# Patient Record
Sex: Male | Born: 1935 | Race: White | Hispanic: No | Smoking: Former smoker
Health system: Southern US, Community
[De-identification: ages and names within clinical notes are randomized; demographics above are authoritative.]

## PROBLEM LIST (undated history)

## (undated) DIAGNOSIS — I739 Peripheral vascular disease, unspecified: Secondary | ICD-10-CM

## (undated) DIAGNOSIS — I69354 Hemiplegia and hemiparesis following cerebral infarction affecting left non-dominant side: Secondary | ICD-10-CM

## (undated) DIAGNOSIS — K635 Polyp of colon: Secondary | ICD-10-CM

## (undated) DIAGNOSIS — E785 Hyperlipidemia, unspecified: Secondary | ICD-10-CM

## (undated) DIAGNOSIS — G20A1 Parkinson's disease without dyskinesia, without mention of fluctuations: Secondary | ICD-10-CM

## (undated) DIAGNOSIS — N2889 Other specified disorders of kidney and ureter: Secondary | ICD-10-CM

## (undated) DIAGNOSIS — I10 Essential (primary) hypertension: Secondary | ICD-10-CM

## (undated) DIAGNOSIS — Z66 Do not resuscitate: Secondary | ICD-10-CM

## (undated) DIAGNOSIS — F419 Anxiety disorder, unspecified: Secondary | ICD-10-CM

## (undated) DIAGNOSIS — C801 Malignant (primary) neoplasm, unspecified: Secondary | ICD-10-CM

## (undated) DIAGNOSIS — I480 Paroxysmal atrial fibrillation: Secondary | ICD-10-CM

## (undated) DIAGNOSIS — C4490 Unspecified malignant neoplasm of skin, unspecified: Secondary | ICD-10-CM

## (undated) DIAGNOSIS — M1990 Unspecified osteoarthritis, unspecified site: Secondary | ICD-10-CM

## (undated) DIAGNOSIS — G20 Parkinson's disease: Secondary | ICD-10-CM

## (undated) DIAGNOSIS — F329 Major depressive disorder, single episode, unspecified: Secondary | ICD-10-CM

## (undated) DIAGNOSIS — I509 Heart failure, unspecified: Secondary | ICD-10-CM

## (undated) DIAGNOSIS — Z45018 Encounter for adjustment and management of other part of cardiac pacemaker: Secondary | ICD-10-CM

## (undated) DIAGNOSIS — F32A Depression, unspecified: Secondary | ICD-10-CM

## (undated) HISTORY — PX: OTHER SURGICAL HISTORY: SHX169

---

## 2014-03-04 DIAGNOSIS — B351 Tinea unguium: Secondary | ICD-10-CM | POA: Diagnosis not present

## 2014-03-04 DIAGNOSIS — M79609 Pain in unspecified limb: Secondary | ICD-10-CM | POA: Diagnosis not present

## 2014-03-22 DIAGNOSIS — R109 Unspecified abdominal pain: Secondary | ICD-10-CM | POA: Diagnosis not present

## 2014-03-22 DIAGNOSIS — R51 Headache: Secondary | ICD-10-CM | POA: Diagnosis not present

## 2014-04-23 DIAGNOSIS — I10 Essential (primary) hypertension: Secondary | ICD-10-CM | POA: Diagnosis not present

## 2014-04-23 DIAGNOSIS — IMO0001 Reserved for inherently not codable concepts without codable children: Secondary | ICD-10-CM | POA: Diagnosis not present

## 2014-04-23 DIAGNOSIS — R279 Unspecified lack of coordination: Secondary | ICD-10-CM | POA: Diagnosis not present

## 2014-04-23 DIAGNOSIS — I4891 Unspecified atrial fibrillation: Secondary | ICD-10-CM | POA: Diagnosis not present

## 2014-04-23 DIAGNOSIS — G20 Parkinson's disease: Secondary | ICD-10-CM | POA: Diagnosis not present

## 2014-04-27 DIAGNOSIS — R279 Unspecified lack of coordination: Secondary | ICD-10-CM | POA: Diagnosis not present

## 2014-04-27 DIAGNOSIS — G20 Parkinson's disease: Secondary | ICD-10-CM | POA: Diagnosis not present

## 2014-04-27 DIAGNOSIS — I10 Essential (primary) hypertension: Secondary | ICD-10-CM | POA: Diagnosis not present

## 2014-04-27 DIAGNOSIS — IMO0001 Reserved for inherently not codable concepts without codable children: Secondary | ICD-10-CM | POA: Diagnosis not present

## 2014-04-27 DIAGNOSIS — I4891 Unspecified atrial fibrillation: Secondary | ICD-10-CM | POA: Diagnosis not present

## 2014-04-29 DIAGNOSIS — G20 Parkinson's disease: Secondary | ICD-10-CM | POA: Diagnosis not present

## 2014-04-29 DIAGNOSIS — IMO0001 Reserved for inherently not codable concepts without codable children: Secondary | ICD-10-CM | POA: Diagnosis not present

## 2014-04-29 DIAGNOSIS — I10 Essential (primary) hypertension: Secondary | ICD-10-CM | POA: Diagnosis not present

## 2014-04-29 DIAGNOSIS — I4891 Unspecified atrial fibrillation: Secondary | ICD-10-CM | POA: Diagnosis not present

## 2014-04-29 DIAGNOSIS — R279 Unspecified lack of coordination: Secondary | ICD-10-CM | POA: Diagnosis not present

## 2014-05-06 DIAGNOSIS — R279 Unspecified lack of coordination: Secondary | ICD-10-CM | POA: Diagnosis not present

## 2014-05-06 DIAGNOSIS — I4891 Unspecified atrial fibrillation: Secondary | ICD-10-CM | POA: Diagnosis not present

## 2014-05-06 DIAGNOSIS — G20 Parkinson's disease: Secondary | ICD-10-CM | POA: Diagnosis not present

## 2014-05-06 DIAGNOSIS — IMO0001 Reserved for inherently not codable concepts without codable children: Secondary | ICD-10-CM | POA: Diagnosis not present

## 2014-05-06 DIAGNOSIS — I10 Essential (primary) hypertension: Secondary | ICD-10-CM | POA: Diagnosis not present

## 2014-05-07 DIAGNOSIS — R279 Unspecified lack of coordination: Secondary | ICD-10-CM | POA: Diagnosis not present

## 2014-05-07 DIAGNOSIS — I10 Essential (primary) hypertension: Secondary | ICD-10-CM | POA: Diagnosis not present

## 2014-05-07 DIAGNOSIS — G20 Parkinson's disease: Secondary | ICD-10-CM | POA: Diagnosis not present

## 2014-05-07 DIAGNOSIS — IMO0001 Reserved for inherently not codable concepts without codable children: Secondary | ICD-10-CM | POA: Diagnosis not present

## 2014-05-07 DIAGNOSIS — I4891 Unspecified atrial fibrillation: Secondary | ICD-10-CM | POA: Diagnosis not present

## 2014-05-11 DIAGNOSIS — IMO0001 Reserved for inherently not codable concepts without codable children: Secondary | ICD-10-CM | POA: Diagnosis not present

## 2014-05-11 DIAGNOSIS — R279 Unspecified lack of coordination: Secondary | ICD-10-CM | POA: Diagnosis not present

## 2014-05-11 DIAGNOSIS — I4891 Unspecified atrial fibrillation: Secondary | ICD-10-CM | POA: Diagnosis not present

## 2014-05-11 DIAGNOSIS — I10 Essential (primary) hypertension: Secondary | ICD-10-CM | POA: Diagnosis not present

## 2014-05-11 DIAGNOSIS — G20 Parkinson's disease: Secondary | ICD-10-CM | POA: Diagnosis not present

## 2014-05-13 DIAGNOSIS — I10 Essential (primary) hypertension: Secondary | ICD-10-CM | POA: Diagnosis not present

## 2014-05-13 DIAGNOSIS — IMO0001 Reserved for inherently not codable concepts without codable children: Secondary | ICD-10-CM | POA: Diagnosis not present

## 2014-05-13 DIAGNOSIS — I4891 Unspecified atrial fibrillation: Secondary | ICD-10-CM | POA: Diagnosis not present

## 2014-05-13 DIAGNOSIS — R279 Unspecified lack of coordination: Secondary | ICD-10-CM | POA: Diagnosis not present

## 2014-05-13 DIAGNOSIS — G20 Parkinson's disease: Secondary | ICD-10-CM | POA: Diagnosis not present

## 2014-05-14 DIAGNOSIS — R279 Unspecified lack of coordination: Secondary | ICD-10-CM | POA: Diagnosis not present

## 2014-05-14 DIAGNOSIS — I4891 Unspecified atrial fibrillation: Secondary | ICD-10-CM | POA: Diagnosis not present

## 2014-05-14 DIAGNOSIS — IMO0001 Reserved for inherently not codable concepts without codable children: Secondary | ICD-10-CM | POA: Diagnosis not present

## 2014-05-14 DIAGNOSIS — G20 Parkinson's disease: Secondary | ICD-10-CM | POA: Diagnosis not present

## 2014-05-14 DIAGNOSIS — I10 Essential (primary) hypertension: Secondary | ICD-10-CM | POA: Diagnosis not present

## 2014-05-19 DIAGNOSIS — G20 Parkinson's disease: Secondary | ICD-10-CM | POA: Diagnosis not present

## 2014-05-19 DIAGNOSIS — R279 Unspecified lack of coordination: Secondary | ICD-10-CM | POA: Diagnosis not present

## 2014-05-19 DIAGNOSIS — I4891 Unspecified atrial fibrillation: Secondary | ICD-10-CM | POA: Diagnosis not present

## 2014-05-19 DIAGNOSIS — IMO0001 Reserved for inherently not codable concepts without codable children: Secondary | ICD-10-CM | POA: Diagnosis not present

## 2014-05-19 DIAGNOSIS — I10 Essential (primary) hypertension: Secondary | ICD-10-CM | POA: Diagnosis not present

## 2014-05-20 DIAGNOSIS — I10 Essential (primary) hypertension: Secondary | ICD-10-CM | POA: Diagnosis not present

## 2014-05-20 DIAGNOSIS — R279 Unspecified lack of coordination: Secondary | ICD-10-CM | POA: Diagnosis not present

## 2014-05-20 DIAGNOSIS — G20 Parkinson's disease: Secondary | ICD-10-CM | POA: Diagnosis not present

## 2014-05-20 DIAGNOSIS — IMO0001 Reserved for inherently not codable concepts without codable children: Secondary | ICD-10-CM | POA: Diagnosis not present

## 2014-05-20 DIAGNOSIS — I4891 Unspecified atrial fibrillation: Secondary | ICD-10-CM | POA: Diagnosis not present

## 2014-05-24 DIAGNOSIS — I10 Essential (primary) hypertension: Secondary | ICD-10-CM | POA: Diagnosis not present

## 2014-05-24 DIAGNOSIS — IMO0001 Reserved for inherently not codable concepts without codable children: Secondary | ICD-10-CM | POA: Diagnosis not present

## 2014-05-24 DIAGNOSIS — R279 Unspecified lack of coordination: Secondary | ICD-10-CM | POA: Diagnosis not present

## 2014-05-24 DIAGNOSIS — I4891 Unspecified atrial fibrillation: Secondary | ICD-10-CM | POA: Diagnosis not present

## 2014-05-24 DIAGNOSIS — G20 Parkinson's disease: Secondary | ICD-10-CM | POA: Diagnosis not present

## 2014-06-02 DIAGNOSIS — I10 Essential (primary) hypertension: Secondary | ICD-10-CM | POA: Diagnosis not present

## 2014-06-02 DIAGNOSIS — G20 Parkinson's disease: Secondary | ICD-10-CM | POA: Diagnosis not present

## 2014-06-02 DIAGNOSIS — I4891 Unspecified atrial fibrillation: Secondary | ICD-10-CM | POA: Diagnosis not present

## 2014-06-02 DIAGNOSIS — R279 Unspecified lack of coordination: Secondary | ICD-10-CM | POA: Diagnosis not present

## 2014-06-02 DIAGNOSIS — IMO0001 Reserved for inherently not codable concepts without codable children: Secondary | ICD-10-CM | POA: Diagnosis not present

## 2014-06-03 DIAGNOSIS — M79609 Pain in unspecified limb: Secondary | ICD-10-CM | POA: Diagnosis not present

## 2014-06-03 DIAGNOSIS — B351 Tinea unguium: Secondary | ICD-10-CM | POA: Diagnosis not present

## 2014-06-04 DIAGNOSIS — G20 Parkinson's disease: Secondary | ICD-10-CM | POA: Diagnosis not present

## 2014-06-04 DIAGNOSIS — IMO0001 Reserved for inherently not codable concepts without codable children: Secondary | ICD-10-CM | POA: Diagnosis not present

## 2014-06-04 DIAGNOSIS — R279 Unspecified lack of coordination: Secondary | ICD-10-CM | POA: Diagnosis not present

## 2014-06-04 DIAGNOSIS — I10 Essential (primary) hypertension: Secondary | ICD-10-CM | POA: Diagnosis not present

## 2014-06-04 DIAGNOSIS — I4891 Unspecified atrial fibrillation: Secondary | ICD-10-CM | POA: Diagnosis not present

## 2014-06-07 DIAGNOSIS — IMO0001 Reserved for inherently not codable concepts without codable children: Secondary | ICD-10-CM | POA: Diagnosis not present

## 2014-06-07 DIAGNOSIS — G20 Parkinson's disease: Secondary | ICD-10-CM | POA: Diagnosis not present

## 2014-06-07 DIAGNOSIS — R279 Unspecified lack of coordination: Secondary | ICD-10-CM | POA: Diagnosis not present

## 2014-06-07 DIAGNOSIS — I4891 Unspecified atrial fibrillation: Secondary | ICD-10-CM | POA: Diagnosis not present

## 2014-06-07 DIAGNOSIS — I10 Essential (primary) hypertension: Secondary | ICD-10-CM | POA: Diagnosis not present

## 2014-06-09 DIAGNOSIS — IMO0001 Reserved for inherently not codable concepts without codable children: Secondary | ICD-10-CM | POA: Diagnosis not present

## 2014-06-09 DIAGNOSIS — R279 Unspecified lack of coordination: Secondary | ICD-10-CM | POA: Diagnosis not present

## 2014-06-09 DIAGNOSIS — I4891 Unspecified atrial fibrillation: Secondary | ICD-10-CM | POA: Diagnosis not present

## 2014-06-09 DIAGNOSIS — I10 Essential (primary) hypertension: Secondary | ICD-10-CM | POA: Diagnosis not present

## 2014-06-09 DIAGNOSIS — G20 Parkinson's disease: Secondary | ICD-10-CM | POA: Diagnosis not present

## 2014-06-15 DIAGNOSIS — I4891 Unspecified atrial fibrillation: Secondary | ICD-10-CM | POA: Diagnosis not present

## 2014-06-15 DIAGNOSIS — G20 Parkinson's disease: Secondary | ICD-10-CM | POA: Diagnosis not present

## 2014-06-15 DIAGNOSIS — R279 Unspecified lack of coordination: Secondary | ICD-10-CM | POA: Diagnosis not present

## 2014-06-15 DIAGNOSIS — IMO0001 Reserved for inherently not codable concepts without codable children: Secondary | ICD-10-CM | POA: Diagnosis not present

## 2014-06-15 DIAGNOSIS — I10 Essential (primary) hypertension: Secondary | ICD-10-CM | POA: Diagnosis not present

## 2014-06-17 DIAGNOSIS — R279 Unspecified lack of coordination: Secondary | ICD-10-CM | POA: Diagnosis not present

## 2014-06-17 DIAGNOSIS — I10 Essential (primary) hypertension: Secondary | ICD-10-CM | POA: Diagnosis not present

## 2014-06-17 DIAGNOSIS — IMO0001 Reserved for inherently not codable concepts without codable children: Secondary | ICD-10-CM | POA: Diagnosis not present

## 2014-06-17 DIAGNOSIS — I4891 Unspecified atrial fibrillation: Secondary | ICD-10-CM | POA: Diagnosis not present

## 2014-06-17 DIAGNOSIS — G20 Parkinson's disease: Secondary | ICD-10-CM | POA: Diagnosis not present

## 2014-06-22 DIAGNOSIS — I10 Essential (primary) hypertension: Secondary | ICD-10-CM | POA: Diagnosis not present

## 2014-06-22 DIAGNOSIS — I4891 Unspecified atrial fibrillation: Secondary | ICD-10-CM | POA: Diagnosis not present

## 2014-06-22 DIAGNOSIS — G20 Parkinson's disease: Secondary | ICD-10-CM | POA: Diagnosis not present

## 2014-06-22 DIAGNOSIS — IMO0001 Reserved for inherently not codable concepts without codable children: Secondary | ICD-10-CM | POA: Diagnosis not present

## 2014-06-22 DIAGNOSIS — R279 Unspecified lack of coordination: Secondary | ICD-10-CM | POA: Diagnosis not present

## 2014-06-25 DIAGNOSIS — G20 Parkinson's disease: Secondary | ICD-10-CM | POA: Diagnosis not present

## 2014-06-25 DIAGNOSIS — IMO0001 Reserved for inherently not codable concepts without codable children: Secondary | ICD-10-CM | POA: Diagnosis not present

## 2014-06-25 DIAGNOSIS — I4891 Unspecified atrial fibrillation: Secondary | ICD-10-CM | POA: Diagnosis not present

## 2014-06-25 DIAGNOSIS — I10 Essential (primary) hypertension: Secondary | ICD-10-CM | POA: Diagnosis not present

## 2014-06-25 DIAGNOSIS — R279 Unspecified lack of coordination: Secondary | ICD-10-CM | POA: Diagnosis not present

## 2014-06-29 DIAGNOSIS — R279 Unspecified lack of coordination: Secondary | ICD-10-CM | POA: Diagnosis not present

## 2014-06-29 DIAGNOSIS — I10 Essential (primary) hypertension: Secondary | ICD-10-CM | POA: Diagnosis not present

## 2014-06-29 DIAGNOSIS — G20 Parkinson's disease: Secondary | ICD-10-CM | POA: Diagnosis not present

## 2014-06-29 DIAGNOSIS — IMO0001 Reserved for inherently not codable concepts without codable children: Secondary | ICD-10-CM | POA: Diagnosis not present

## 2014-06-29 DIAGNOSIS — I4891 Unspecified atrial fibrillation: Secondary | ICD-10-CM | POA: Diagnosis not present

## 2014-07-01 DIAGNOSIS — R279 Unspecified lack of coordination: Secondary | ICD-10-CM | POA: Diagnosis not present

## 2014-07-01 DIAGNOSIS — I4891 Unspecified atrial fibrillation: Secondary | ICD-10-CM | POA: Diagnosis not present

## 2014-07-01 DIAGNOSIS — I10 Essential (primary) hypertension: Secondary | ICD-10-CM | POA: Diagnosis not present

## 2014-07-01 DIAGNOSIS — IMO0001 Reserved for inherently not codable concepts without codable children: Secondary | ICD-10-CM | POA: Diagnosis not present

## 2014-07-01 DIAGNOSIS — G20 Parkinson's disease: Secondary | ICD-10-CM | POA: Diagnosis not present

## 2014-07-05 DIAGNOSIS — G20 Parkinson's disease: Secondary | ICD-10-CM | POA: Diagnosis not present

## 2014-07-05 DIAGNOSIS — I10 Essential (primary) hypertension: Secondary | ICD-10-CM | POA: Diagnosis not present

## 2014-07-05 DIAGNOSIS — R279 Unspecified lack of coordination: Secondary | ICD-10-CM | POA: Diagnosis not present

## 2014-07-05 DIAGNOSIS — I4891 Unspecified atrial fibrillation: Secondary | ICD-10-CM | POA: Diagnosis not present

## 2014-07-05 DIAGNOSIS — IMO0001 Reserved for inherently not codable concepts without codable children: Secondary | ICD-10-CM | POA: Diagnosis not present

## 2014-07-07 DIAGNOSIS — I10 Essential (primary) hypertension: Secondary | ICD-10-CM | POA: Diagnosis not present

## 2014-07-07 DIAGNOSIS — I4891 Unspecified atrial fibrillation: Secondary | ICD-10-CM | POA: Diagnosis not present

## 2014-07-07 DIAGNOSIS — G20 Parkinson's disease: Secondary | ICD-10-CM | POA: Diagnosis not present

## 2014-07-07 DIAGNOSIS — R279 Unspecified lack of coordination: Secondary | ICD-10-CM | POA: Diagnosis not present

## 2014-07-07 DIAGNOSIS — IMO0001 Reserved for inherently not codable concepts without codable children: Secondary | ICD-10-CM | POA: Diagnosis not present

## 2014-07-14 DIAGNOSIS — IMO0001 Reserved for inherently not codable concepts without codable children: Secondary | ICD-10-CM | POA: Diagnosis not present

## 2014-07-14 DIAGNOSIS — G20 Parkinson's disease: Secondary | ICD-10-CM | POA: Diagnosis not present

## 2014-07-14 DIAGNOSIS — I4891 Unspecified atrial fibrillation: Secondary | ICD-10-CM | POA: Diagnosis not present

## 2014-07-14 DIAGNOSIS — R279 Unspecified lack of coordination: Secondary | ICD-10-CM | POA: Diagnosis not present

## 2014-07-14 DIAGNOSIS — I10 Essential (primary) hypertension: Secondary | ICD-10-CM | POA: Diagnosis not present

## 2014-07-16 DIAGNOSIS — I10 Essential (primary) hypertension: Secondary | ICD-10-CM | POA: Diagnosis not present

## 2014-07-16 DIAGNOSIS — R279 Unspecified lack of coordination: Secondary | ICD-10-CM | POA: Diagnosis not present

## 2014-07-16 DIAGNOSIS — IMO0001 Reserved for inherently not codable concepts without codable children: Secondary | ICD-10-CM | POA: Diagnosis not present

## 2014-07-16 DIAGNOSIS — I4891 Unspecified atrial fibrillation: Secondary | ICD-10-CM | POA: Diagnosis not present

## 2014-07-16 DIAGNOSIS — G20 Parkinson's disease: Secondary | ICD-10-CM | POA: Diagnosis not present

## 2014-07-21 DIAGNOSIS — I4891 Unspecified atrial fibrillation: Secondary | ICD-10-CM | POA: Diagnosis not present

## 2014-07-21 DIAGNOSIS — G20 Parkinson's disease: Secondary | ICD-10-CM | POA: Diagnosis not present

## 2014-07-21 DIAGNOSIS — IMO0001 Reserved for inherently not codable concepts without codable children: Secondary | ICD-10-CM | POA: Diagnosis not present

## 2014-07-21 DIAGNOSIS — R279 Unspecified lack of coordination: Secondary | ICD-10-CM | POA: Diagnosis not present

## 2014-07-21 DIAGNOSIS — I10 Essential (primary) hypertension: Secondary | ICD-10-CM | POA: Diagnosis not present

## 2014-07-23 DIAGNOSIS — I4891 Unspecified atrial fibrillation: Secondary | ICD-10-CM | POA: Diagnosis not present

## 2014-07-23 DIAGNOSIS — R279 Unspecified lack of coordination: Secondary | ICD-10-CM | POA: Diagnosis not present

## 2014-07-23 DIAGNOSIS — IMO0001 Reserved for inherently not codable concepts without codable children: Secondary | ICD-10-CM | POA: Diagnosis not present

## 2014-07-23 DIAGNOSIS — G20 Parkinson's disease: Secondary | ICD-10-CM | POA: Diagnosis not present

## 2014-07-23 DIAGNOSIS — I10 Essential (primary) hypertension: Secondary | ICD-10-CM | POA: Diagnosis not present

## 2014-07-26 DIAGNOSIS — R279 Unspecified lack of coordination: Secondary | ICD-10-CM | POA: Diagnosis not present

## 2014-07-26 DIAGNOSIS — IMO0001 Reserved for inherently not codable concepts without codable children: Secondary | ICD-10-CM | POA: Diagnosis not present

## 2014-07-26 DIAGNOSIS — I4891 Unspecified atrial fibrillation: Secondary | ICD-10-CM | POA: Diagnosis not present

## 2014-07-26 DIAGNOSIS — G20 Parkinson's disease: Secondary | ICD-10-CM | POA: Diagnosis not present

## 2014-07-26 DIAGNOSIS — I10 Essential (primary) hypertension: Secondary | ICD-10-CM | POA: Diagnosis not present

## 2014-07-29 DIAGNOSIS — R279 Unspecified lack of coordination: Secondary | ICD-10-CM | POA: Diagnosis not present

## 2014-07-29 DIAGNOSIS — IMO0001 Reserved for inherently not codable concepts without codable children: Secondary | ICD-10-CM | POA: Diagnosis not present

## 2014-07-29 DIAGNOSIS — I4891 Unspecified atrial fibrillation: Secondary | ICD-10-CM | POA: Diagnosis not present

## 2014-07-29 DIAGNOSIS — G20 Parkinson's disease: Secondary | ICD-10-CM | POA: Diagnosis not present

## 2014-07-29 DIAGNOSIS — I10 Essential (primary) hypertension: Secondary | ICD-10-CM | POA: Diagnosis not present

## 2014-08-02 DIAGNOSIS — G20 Parkinson's disease: Secondary | ICD-10-CM | POA: Diagnosis not present

## 2014-08-02 DIAGNOSIS — I10 Essential (primary) hypertension: Secondary | ICD-10-CM | POA: Diagnosis not present

## 2014-08-02 DIAGNOSIS — IMO0001 Reserved for inherently not codable concepts without codable children: Secondary | ICD-10-CM | POA: Diagnosis not present

## 2014-08-02 DIAGNOSIS — I4891 Unspecified atrial fibrillation: Secondary | ICD-10-CM | POA: Diagnosis not present

## 2014-08-02 DIAGNOSIS — R279 Unspecified lack of coordination: Secondary | ICD-10-CM | POA: Diagnosis not present

## 2014-08-04 DIAGNOSIS — I10 Essential (primary) hypertension: Secondary | ICD-10-CM | POA: Diagnosis not present

## 2014-08-04 DIAGNOSIS — I4891 Unspecified atrial fibrillation: Secondary | ICD-10-CM | POA: Diagnosis not present

## 2014-08-04 DIAGNOSIS — R279 Unspecified lack of coordination: Secondary | ICD-10-CM | POA: Diagnosis not present

## 2014-08-04 DIAGNOSIS — IMO0001 Reserved for inherently not codable concepts without codable children: Secondary | ICD-10-CM | POA: Diagnosis not present

## 2014-08-04 DIAGNOSIS — G20 Parkinson's disease: Secondary | ICD-10-CM | POA: Diagnosis not present

## 2014-08-10 DIAGNOSIS — IMO0001 Reserved for inherently not codable concepts without codable children: Secondary | ICD-10-CM | POA: Diagnosis not present

## 2014-08-10 DIAGNOSIS — R279 Unspecified lack of coordination: Secondary | ICD-10-CM | POA: Diagnosis not present

## 2014-08-10 DIAGNOSIS — I10 Essential (primary) hypertension: Secondary | ICD-10-CM | POA: Diagnosis not present

## 2014-08-10 DIAGNOSIS — I4891 Unspecified atrial fibrillation: Secondary | ICD-10-CM | POA: Diagnosis not present

## 2014-08-10 DIAGNOSIS — G20 Parkinson's disease: Secondary | ICD-10-CM | POA: Diagnosis not present

## 2014-08-12 DIAGNOSIS — I4891 Unspecified atrial fibrillation: Secondary | ICD-10-CM | POA: Diagnosis not present

## 2014-08-12 DIAGNOSIS — R279 Unspecified lack of coordination: Secondary | ICD-10-CM | POA: Diagnosis not present

## 2014-08-12 DIAGNOSIS — I10 Essential (primary) hypertension: Secondary | ICD-10-CM | POA: Diagnosis not present

## 2014-08-12 DIAGNOSIS — G20 Parkinson's disease: Secondary | ICD-10-CM | POA: Diagnosis not present

## 2014-08-12 DIAGNOSIS — IMO0001 Reserved for inherently not codable concepts without codable children: Secondary | ICD-10-CM | POA: Diagnosis not present

## 2014-09-02 DIAGNOSIS — L609 Nail disorder, unspecified: Secondary | ICD-10-CM | POA: Diagnosis not present

## 2014-10-07 DIAGNOSIS — Z23 Encounter for immunization: Secondary | ICD-10-CM | POA: Diagnosis not present

## 2014-12-02 DIAGNOSIS — L609 Nail disorder, unspecified: Secondary | ICD-10-CM | POA: Diagnosis not present

## 2015-01-09 DIAGNOSIS — M79602 Pain in left arm: Secondary | ICD-10-CM | POA: Diagnosis not present

## 2015-01-09 DIAGNOSIS — R531 Weakness: Secondary | ICD-10-CM | POA: Diagnosis not present

## 2015-02-06 DIAGNOSIS — R52 Pain, unspecified: Secondary | ICD-10-CM | POA: Diagnosis not present

## 2015-02-06 DIAGNOSIS — R100 Acute abdomen: Secondary | ICD-10-CM | POA: Diagnosis not present

## 2015-02-15 DIAGNOSIS — R5381 Other malaise: Secondary | ICD-10-CM | POA: Diagnosis not present

## 2015-02-15 DIAGNOSIS — R1032 Left lower quadrant pain: Secondary | ICD-10-CM | POA: Diagnosis not present

## 2015-03-03 DIAGNOSIS — L609 Nail disorder, unspecified: Secondary | ICD-10-CM | POA: Diagnosis not present

## 2015-03-17 DIAGNOSIS — R2689 Other abnormalities of gait and mobility: Secondary | ICD-10-CM | POA: Diagnosis not present

## 2015-03-17 DIAGNOSIS — F419 Anxiety disorder, unspecified: Secondary | ICD-10-CM | POA: Diagnosis not present

## 2015-03-17 DIAGNOSIS — I10 Essential (primary) hypertension: Secondary | ICD-10-CM | POA: Diagnosis not present

## 2015-03-17 DIAGNOSIS — G20 Parkinson's disease: Secondary | ICD-10-CM | POA: Diagnosis not present

## 2015-03-17 DIAGNOSIS — F329 Major depressive disorder, single episode, unspecified: Secondary | ICD-10-CM | POA: Diagnosis not present

## 2015-03-17 DIAGNOSIS — I2510 Atherosclerotic heart disease of native coronary artery without angina pectoris: Secondary | ICD-10-CM | POA: Diagnosis not present

## 2015-03-22 DIAGNOSIS — I10 Essential (primary) hypertension: Secondary | ICD-10-CM | POA: Diagnosis not present

## 2015-03-22 DIAGNOSIS — F419 Anxiety disorder, unspecified: Secondary | ICD-10-CM | POA: Diagnosis not present

## 2015-03-22 DIAGNOSIS — F329 Major depressive disorder, single episode, unspecified: Secondary | ICD-10-CM | POA: Diagnosis not present

## 2015-03-22 DIAGNOSIS — R2689 Other abnormalities of gait and mobility: Secondary | ICD-10-CM | POA: Diagnosis not present

## 2015-03-22 DIAGNOSIS — G20 Parkinson's disease: Secondary | ICD-10-CM | POA: Diagnosis not present

## 2015-03-22 DIAGNOSIS — I2510 Atherosclerotic heart disease of native coronary artery without angina pectoris: Secondary | ICD-10-CM | POA: Diagnosis not present

## 2015-03-25 DIAGNOSIS — G20 Parkinson's disease: Secondary | ICD-10-CM | POA: Diagnosis not present

## 2015-03-25 DIAGNOSIS — I2510 Atherosclerotic heart disease of native coronary artery without angina pectoris: Secondary | ICD-10-CM | POA: Diagnosis not present

## 2015-03-25 DIAGNOSIS — R2689 Other abnormalities of gait and mobility: Secondary | ICD-10-CM | POA: Diagnosis not present

## 2015-03-25 DIAGNOSIS — F329 Major depressive disorder, single episode, unspecified: Secondary | ICD-10-CM | POA: Diagnosis not present

## 2015-03-25 DIAGNOSIS — F419 Anxiety disorder, unspecified: Secondary | ICD-10-CM | POA: Diagnosis not present

## 2015-03-25 DIAGNOSIS — I10 Essential (primary) hypertension: Secondary | ICD-10-CM | POA: Diagnosis not present

## 2015-03-28 DIAGNOSIS — F329 Major depressive disorder, single episode, unspecified: Secondary | ICD-10-CM | POA: Diagnosis not present

## 2015-03-28 DIAGNOSIS — I10 Essential (primary) hypertension: Secondary | ICD-10-CM | POA: Diagnosis not present

## 2015-03-28 DIAGNOSIS — G20 Parkinson's disease: Secondary | ICD-10-CM | POA: Diagnosis not present

## 2015-03-28 DIAGNOSIS — F419 Anxiety disorder, unspecified: Secondary | ICD-10-CM | POA: Diagnosis not present

## 2015-03-28 DIAGNOSIS — R2689 Other abnormalities of gait and mobility: Secondary | ICD-10-CM | POA: Diagnosis not present

## 2015-03-28 DIAGNOSIS — I2510 Atherosclerotic heart disease of native coronary artery without angina pectoris: Secondary | ICD-10-CM | POA: Diagnosis not present

## 2015-03-30 DIAGNOSIS — G20 Parkinson's disease: Secondary | ICD-10-CM | POA: Diagnosis not present

## 2015-03-30 DIAGNOSIS — I2510 Atherosclerotic heart disease of native coronary artery without angina pectoris: Secondary | ICD-10-CM | POA: Diagnosis not present

## 2015-03-30 DIAGNOSIS — I10 Essential (primary) hypertension: Secondary | ICD-10-CM | POA: Diagnosis not present

## 2015-03-30 DIAGNOSIS — F329 Major depressive disorder, single episode, unspecified: Secondary | ICD-10-CM | POA: Diagnosis not present

## 2015-03-30 DIAGNOSIS — F419 Anxiety disorder, unspecified: Secondary | ICD-10-CM | POA: Diagnosis not present

## 2015-03-30 DIAGNOSIS — R2689 Other abnormalities of gait and mobility: Secondary | ICD-10-CM | POA: Diagnosis not present

## 2015-04-04 DIAGNOSIS — I2510 Atherosclerotic heart disease of native coronary artery without angina pectoris: Secondary | ICD-10-CM | POA: Diagnosis not present

## 2015-04-04 DIAGNOSIS — R2689 Other abnormalities of gait and mobility: Secondary | ICD-10-CM | POA: Diagnosis not present

## 2015-04-04 DIAGNOSIS — I10 Essential (primary) hypertension: Secondary | ICD-10-CM | POA: Diagnosis not present

## 2015-04-04 DIAGNOSIS — F419 Anxiety disorder, unspecified: Secondary | ICD-10-CM | POA: Diagnosis not present

## 2015-04-04 DIAGNOSIS — G20 Parkinson's disease: Secondary | ICD-10-CM | POA: Diagnosis not present

## 2015-04-04 DIAGNOSIS — F329 Major depressive disorder, single episode, unspecified: Secondary | ICD-10-CM | POA: Diagnosis not present

## 2015-04-06 DIAGNOSIS — R2689 Other abnormalities of gait and mobility: Secondary | ICD-10-CM | POA: Diagnosis not present

## 2015-04-06 DIAGNOSIS — F329 Major depressive disorder, single episode, unspecified: Secondary | ICD-10-CM | POA: Diagnosis not present

## 2015-04-06 DIAGNOSIS — I10 Essential (primary) hypertension: Secondary | ICD-10-CM | POA: Diagnosis not present

## 2015-04-06 DIAGNOSIS — F419 Anxiety disorder, unspecified: Secondary | ICD-10-CM | POA: Diagnosis not present

## 2015-04-06 DIAGNOSIS — I2510 Atherosclerotic heart disease of native coronary artery without angina pectoris: Secondary | ICD-10-CM | POA: Diagnosis not present

## 2015-04-06 DIAGNOSIS — G20 Parkinson's disease: Secondary | ICD-10-CM | POA: Diagnosis not present

## 2015-04-11 DIAGNOSIS — F329 Major depressive disorder, single episode, unspecified: Secondary | ICD-10-CM | POA: Diagnosis not present

## 2015-04-11 DIAGNOSIS — F419 Anxiety disorder, unspecified: Secondary | ICD-10-CM | POA: Diagnosis not present

## 2015-04-11 DIAGNOSIS — G20 Parkinson's disease: Secondary | ICD-10-CM | POA: Diagnosis not present

## 2015-04-11 DIAGNOSIS — R2689 Other abnormalities of gait and mobility: Secondary | ICD-10-CM | POA: Diagnosis not present

## 2015-04-11 DIAGNOSIS — I10 Essential (primary) hypertension: Secondary | ICD-10-CM | POA: Diagnosis not present

## 2015-04-11 DIAGNOSIS — I2510 Atherosclerotic heart disease of native coronary artery without angina pectoris: Secondary | ICD-10-CM | POA: Diagnosis not present

## 2015-04-13 DIAGNOSIS — R2689 Other abnormalities of gait and mobility: Secondary | ICD-10-CM | POA: Diagnosis not present

## 2015-04-13 DIAGNOSIS — I2510 Atherosclerotic heart disease of native coronary artery without angina pectoris: Secondary | ICD-10-CM | POA: Diagnosis not present

## 2015-04-13 DIAGNOSIS — F329 Major depressive disorder, single episode, unspecified: Secondary | ICD-10-CM | POA: Diagnosis not present

## 2015-04-13 DIAGNOSIS — F419 Anxiety disorder, unspecified: Secondary | ICD-10-CM | POA: Diagnosis not present

## 2015-04-13 DIAGNOSIS — I10 Essential (primary) hypertension: Secondary | ICD-10-CM | POA: Diagnosis not present

## 2015-04-13 DIAGNOSIS — G20 Parkinson's disease: Secondary | ICD-10-CM | POA: Diagnosis not present

## 2015-04-18 DIAGNOSIS — I2510 Atherosclerotic heart disease of native coronary artery without angina pectoris: Secondary | ICD-10-CM | POA: Diagnosis not present

## 2015-04-18 DIAGNOSIS — G20 Parkinson's disease: Secondary | ICD-10-CM | POA: Diagnosis not present

## 2015-04-18 DIAGNOSIS — F329 Major depressive disorder, single episode, unspecified: Secondary | ICD-10-CM | POA: Diagnosis not present

## 2015-04-18 DIAGNOSIS — I10 Essential (primary) hypertension: Secondary | ICD-10-CM | POA: Diagnosis not present

## 2015-04-18 DIAGNOSIS — R2689 Other abnormalities of gait and mobility: Secondary | ICD-10-CM | POA: Diagnosis not present

## 2015-04-18 DIAGNOSIS — F419 Anxiety disorder, unspecified: Secondary | ICD-10-CM | POA: Diagnosis not present

## 2015-04-20 DIAGNOSIS — G20 Parkinson's disease: Secondary | ICD-10-CM | POA: Diagnosis not present

## 2015-04-20 DIAGNOSIS — F419 Anxiety disorder, unspecified: Secondary | ICD-10-CM | POA: Diagnosis not present

## 2015-04-20 DIAGNOSIS — F329 Major depressive disorder, single episode, unspecified: Secondary | ICD-10-CM | POA: Diagnosis not present

## 2015-04-20 DIAGNOSIS — R2689 Other abnormalities of gait and mobility: Secondary | ICD-10-CM | POA: Diagnosis not present

## 2015-04-20 DIAGNOSIS — I2510 Atherosclerotic heart disease of native coronary artery without angina pectoris: Secondary | ICD-10-CM | POA: Diagnosis not present

## 2015-04-20 DIAGNOSIS — I10 Essential (primary) hypertension: Secondary | ICD-10-CM | POA: Diagnosis not present

## 2015-04-25 DIAGNOSIS — R2689 Other abnormalities of gait and mobility: Secondary | ICD-10-CM | POA: Diagnosis not present

## 2015-04-25 DIAGNOSIS — F419 Anxiety disorder, unspecified: Secondary | ICD-10-CM | POA: Diagnosis not present

## 2015-04-25 DIAGNOSIS — I10 Essential (primary) hypertension: Secondary | ICD-10-CM | POA: Diagnosis not present

## 2015-04-25 DIAGNOSIS — G20 Parkinson's disease: Secondary | ICD-10-CM | POA: Diagnosis not present

## 2015-04-25 DIAGNOSIS — F329 Major depressive disorder, single episode, unspecified: Secondary | ICD-10-CM | POA: Diagnosis not present

## 2015-04-25 DIAGNOSIS — I2510 Atherosclerotic heart disease of native coronary artery without angina pectoris: Secondary | ICD-10-CM | POA: Diagnosis not present

## 2015-04-27 DIAGNOSIS — G20 Parkinson's disease: Secondary | ICD-10-CM | POA: Diagnosis not present

## 2015-04-27 DIAGNOSIS — I10 Essential (primary) hypertension: Secondary | ICD-10-CM | POA: Diagnosis not present

## 2015-04-27 DIAGNOSIS — I2510 Atherosclerotic heart disease of native coronary artery without angina pectoris: Secondary | ICD-10-CM | POA: Diagnosis not present

## 2015-04-27 DIAGNOSIS — R2689 Other abnormalities of gait and mobility: Secondary | ICD-10-CM | POA: Diagnosis not present

## 2015-04-27 DIAGNOSIS — F329 Major depressive disorder, single episode, unspecified: Secondary | ICD-10-CM | POA: Diagnosis not present

## 2015-04-27 DIAGNOSIS — F419 Anxiety disorder, unspecified: Secondary | ICD-10-CM | POA: Diagnosis not present

## 2015-05-06 DIAGNOSIS — I2510 Atherosclerotic heart disease of native coronary artery without angina pectoris: Secondary | ICD-10-CM | POA: Diagnosis not present

## 2015-05-06 DIAGNOSIS — F419 Anxiety disorder, unspecified: Secondary | ICD-10-CM | POA: Diagnosis not present

## 2015-05-06 DIAGNOSIS — G20 Parkinson's disease: Secondary | ICD-10-CM | POA: Diagnosis not present

## 2015-05-06 DIAGNOSIS — R2689 Other abnormalities of gait and mobility: Secondary | ICD-10-CM | POA: Diagnosis not present

## 2015-05-06 DIAGNOSIS — I10 Essential (primary) hypertension: Secondary | ICD-10-CM | POA: Diagnosis not present

## 2015-05-06 DIAGNOSIS — F329 Major depressive disorder, single episode, unspecified: Secondary | ICD-10-CM | POA: Diagnosis not present

## 2015-05-10 DIAGNOSIS — F329 Major depressive disorder, single episode, unspecified: Secondary | ICD-10-CM | POA: Diagnosis not present

## 2015-05-10 DIAGNOSIS — F419 Anxiety disorder, unspecified: Secondary | ICD-10-CM | POA: Diagnosis not present

## 2015-05-10 DIAGNOSIS — R2689 Other abnormalities of gait and mobility: Secondary | ICD-10-CM | POA: Diagnosis not present

## 2015-05-10 DIAGNOSIS — I2510 Atherosclerotic heart disease of native coronary artery without angina pectoris: Secondary | ICD-10-CM | POA: Diagnosis not present

## 2015-05-10 DIAGNOSIS — I10 Essential (primary) hypertension: Secondary | ICD-10-CM | POA: Diagnosis not present

## 2015-05-10 DIAGNOSIS — G20 Parkinson's disease: Secondary | ICD-10-CM | POA: Diagnosis not present

## 2015-05-14 DIAGNOSIS — F419 Anxiety disorder, unspecified: Secondary | ICD-10-CM | POA: Diagnosis not present

## 2015-05-14 DIAGNOSIS — I10 Essential (primary) hypertension: Secondary | ICD-10-CM | POA: Diagnosis not present

## 2015-05-14 DIAGNOSIS — R2689 Other abnormalities of gait and mobility: Secondary | ICD-10-CM | POA: Diagnosis not present

## 2015-05-14 DIAGNOSIS — G20 Parkinson's disease: Secondary | ICD-10-CM | POA: Diagnosis not present

## 2015-05-14 DIAGNOSIS — F329 Major depressive disorder, single episode, unspecified: Secondary | ICD-10-CM | POA: Diagnosis not present

## 2015-05-14 DIAGNOSIS — I2510 Atherosclerotic heart disease of native coronary artery without angina pectoris: Secondary | ICD-10-CM | POA: Diagnosis not present

## 2015-05-16 DIAGNOSIS — I10 Essential (primary) hypertension: Secondary | ICD-10-CM | POA: Diagnosis not present

## 2015-05-16 DIAGNOSIS — G20 Parkinson's disease: Secondary | ICD-10-CM | POA: Diagnosis not present

## 2015-05-16 DIAGNOSIS — R2689 Other abnormalities of gait and mobility: Secondary | ICD-10-CM | POA: Diagnosis not present

## 2015-05-16 DIAGNOSIS — F329 Major depressive disorder, single episode, unspecified: Secondary | ICD-10-CM | POA: Diagnosis not present

## 2015-05-16 DIAGNOSIS — F419 Anxiety disorder, unspecified: Secondary | ICD-10-CM | POA: Diagnosis not present

## 2015-05-16 DIAGNOSIS — I2510 Atherosclerotic heart disease of native coronary artery without angina pectoris: Secondary | ICD-10-CM | POA: Diagnosis not present

## 2015-05-19 DIAGNOSIS — G20 Parkinson's disease: Secondary | ICD-10-CM | POA: Diagnosis not present

## 2015-05-19 DIAGNOSIS — I10 Essential (primary) hypertension: Secondary | ICD-10-CM | POA: Diagnosis not present

## 2015-05-19 DIAGNOSIS — F329 Major depressive disorder, single episode, unspecified: Secondary | ICD-10-CM | POA: Diagnosis not present

## 2015-05-19 DIAGNOSIS — F419 Anxiety disorder, unspecified: Secondary | ICD-10-CM | POA: Diagnosis not present

## 2015-05-19 DIAGNOSIS — I2510 Atherosclerotic heart disease of native coronary artery without angina pectoris: Secondary | ICD-10-CM | POA: Diagnosis not present

## 2015-05-19 DIAGNOSIS — R2689 Other abnormalities of gait and mobility: Secondary | ICD-10-CM | POA: Diagnosis not present

## 2015-05-26 DIAGNOSIS — I10 Essential (primary) hypertension: Secondary | ICD-10-CM | POA: Diagnosis not present

## 2015-05-26 DIAGNOSIS — G20 Parkinson's disease: Secondary | ICD-10-CM | POA: Diagnosis not present

## 2015-05-26 DIAGNOSIS — F329 Major depressive disorder, single episode, unspecified: Secondary | ICD-10-CM | POA: Diagnosis not present

## 2015-05-26 DIAGNOSIS — R2689 Other abnormalities of gait and mobility: Secondary | ICD-10-CM | POA: Diagnosis not present

## 2015-05-26 DIAGNOSIS — I2510 Atherosclerotic heart disease of native coronary artery without angina pectoris: Secondary | ICD-10-CM | POA: Diagnosis not present

## 2015-05-26 DIAGNOSIS — F419 Anxiety disorder, unspecified: Secondary | ICD-10-CM | POA: Diagnosis not present

## 2015-06-02 DIAGNOSIS — L84 Corns and callosities: Secondary | ICD-10-CM | POA: Diagnosis not present

## 2015-06-02 DIAGNOSIS — B351 Tinea unguium: Secondary | ICD-10-CM | POA: Diagnosis not present

## 2015-06-02 DIAGNOSIS — F329 Major depressive disorder, single episode, unspecified: Secondary | ICD-10-CM | POA: Diagnosis not present

## 2015-06-02 DIAGNOSIS — R2689 Other abnormalities of gait and mobility: Secondary | ICD-10-CM | POA: Diagnosis not present

## 2015-06-02 DIAGNOSIS — G20 Parkinson's disease: Secondary | ICD-10-CM | POA: Diagnosis not present

## 2015-06-02 DIAGNOSIS — I10 Essential (primary) hypertension: Secondary | ICD-10-CM | POA: Diagnosis not present

## 2015-06-02 DIAGNOSIS — L609 Nail disorder, unspecified: Secondary | ICD-10-CM | POA: Diagnosis not present

## 2015-06-02 DIAGNOSIS — F419 Anxiety disorder, unspecified: Secondary | ICD-10-CM | POA: Diagnosis not present

## 2015-06-02 DIAGNOSIS — I2510 Atherosclerotic heart disease of native coronary artery without angina pectoris: Secondary | ICD-10-CM | POA: Diagnosis not present

## 2015-06-09 DIAGNOSIS — F419 Anxiety disorder, unspecified: Secondary | ICD-10-CM | POA: Diagnosis not present

## 2015-06-09 DIAGNOSIS — I2510 Atherosclerotic heart disease of native coronary artery without angina pectoris: Secondary | ICD-10-CM | POA: Diagnosis not present

## 2015-06-09 DIAGNOSIS — I10 Essential (primary) hypertension: Secondary | ICD-10-CM | POA: Diagnosis not present

## 2015-06-09 DIAGNOSIS — F329 Major depressive disorder, single episode, unspecified: Secondary | ICD-10-CM | POA: Diagnosis not present

## 2015-06-09 DIAGNOSIS — G20 Parkinson's disease: Secondary | ICD-10-CM | POA: Diagnosis not present

## 2015-06-09 DIAGNOSIS — R2689 Other abnormalities of gait and mobility: Secondary | ICD-10-CM | POA: Diagnosis not present

## 2015-06-16 DIAGNOSIS — R2689 Other abnormalities of gait and mobility: Secondary | ICD-10-CM | POA: Diagnosis not present

## 2015-06-16 DIAGNOSIS — F329 Major depressive disorder, single episode, unspecified: Secondary | ICD-10-CM | POA: Diagnosis not present

## 2015-06-16 DIAGNOSIS — G20 Parkinson's disease: Secondary | ICD-10-CM | POA: Diagnosis not present

## 2015-06-16 DIAGNOSIS — F419 Anxiety disorder, unspecified: Secondary | ICD-10-CM | POA: Diagnosis not present

## 2015-06-16 DIAGNOSIS — I2510 Atherosclerotic heart disease of native coronary artery without angina pectoris: Secondary | ICD-10-CM | POA: Diagnosis not present

## 2015-06-16 DIAGNOSIS — I10 Essential (primary) hypertension: Secondary | ICD-10-CM | POA: Diagnosis not present

## 2015-06-21 DIAGNOSIS — I10 Essential (primary) hypertension: Secondary | ICD-10-CM | POA: Diagnosis not present

## 2015-06-21 DIAGNOSIS — F419 Anxiety disorder, unspecified: Secondary | ICD-10-CM | POA: Diagnosis not present

## 2015-06-21 DIAGNOSIS — F329 Major depressive disorder, single episode, unspecified: Secondary | ICD-10-CM | POA: Diagnosis not present

## 2015-06-21 DIAGNOSIS — R2689 Other abnormalities of gait and mobility: Secondary | ICD-10-CM | POA: Diagnosis not present

## 2015-06-21 DIAGNOSIS — G20 Parkinson's disease: Secondary | ICD-10-CM | POA: Diagnosis not present

## 2015-06-21 DIAGNOSIS — I2510 Atherosclerotic heart disease of native coronary artery without angina pectoris: Secondary | ICD-10-CM | POA: Diagnosis not present

## 2015-07-01 DIAGNOSIS — F329 Major depressive disorder, single episode, unspecified: Secondary | ICD-10-CM | POA: Diagnosis not present

## 2015-07-01 DIAGNOSIS — I10 Essential (primary) hypertension: Secondary | ICD-10-CM | POA: Diagnosis not present

## 2015-07-01 DIAGNOSIS — R2689 Other abnormalities of gait and mobility: Secondary | ICD-10-CM | POA: Diagnosis not present

## 2015-07-01 DIAGNOSIS — F419 Anxiety disorder, unspecified: Secondary | ICD-10-CM | POA: Diagnosis not present

## 2015-07-01 DIAGNOSIS — G20 Parkinson's disease: Secondary | ICD-10-CM | POA: Diagnosis not present

## 2015-07-01 DIAGNOSIS — I2510 Atherosclerotic heart disease of native coronary artery without angina pectoris: Secondary | ICD-10-CM | POA: Diagnosis not present

## 2015-07-08 DIAGNOSIS — F419 Anxiety disorder, unspecified: Secondary | ICD-10-CM | POA: Diagnosis not present

## 2015-07-08 DIAGNOSIS — I10 Essential (primary) hypertension: Secondary | ICD-10-CM | POA: Diagnosis not present

## 2015-07-08 DIAGNOSIS — I2510 Atherosclerotic heart disease of native coronary artery without angina pectoris: Secondary | ICD-10-CM | POA: Diagnosis not present

## 2015-07-08 DIAGNOSIS — R2689 Other abnormalities of gait and mobility: Secondary | ICD-10-CM | POA: Diagnosis not present

## 2015-07-08 DIAGNOSIS — F329 Major depressive disorder, single episode, unspecified: Secondary | ICD-10-CM | POA: Diagnosis not present

## 2015-07-08 DIAGNOSIS — G20 Parkinson's disease: Secondary | ICD-10-CM | POA: Diagnosis not present

## 2015-07-14 DIAGNOSIS — F329 Major depressive disorder, single episode, unspecified: Secondary | ICD-10-CM | POA: Diagnosis not present

## 2015-07-14 DIAGNOSIS — I10 Essential (primary) hypertension: Secondary | ICD-10-CM | POA: Diagnosis not present

## 2015-07-14 DIAGNOSIS — I2510 Atherosclerotic heart disease of native coronary artery without angina pectoris: Secondary | ICD-10-CM | POA: Diagnosis not present

## 2015-07-14 DIAGNOSIS — R2689 Other abnormalities of gait and mobility: Secondary | ICD-10-CM | POA: Diagnosis not present

## 2015-07-14 DIAGNOSIS — F419 Anxiety disorder, unspecified: Secondary | ICD-10-CM | POA: Diagnosis not present

## 2015-07-14 DIAGNOSIS — G20 Parkinson's disease: Secondary | ICD-10-CM | POA: Diagnosis not present

## 2015-09-01 DIAGNOSIS — B351 Tinea unguium: Secondary | ICD-10-CM | POA: Diagnosis not present

## 2015-09-01 DIAGNOSIS — L609 Nail disorder, unspecified: Secondary | ICD-10-CM | POA: Diagnosis not present

## 2015-09-01 DIAGNOSIS — L84 Corns and callosities: Secondary | ICD-10-CM | POA: Diagnosis not present

## 2015-11-01 DIAGNOSIS — Z23 Encounter for immunization: Secondary | ICD-10-CM | POA: Diagnosis not present

## 2015-12-08 DIAGNOSIS — B351 Tinea unguium: Secondary | ICD-10-CM | POA: Diagnosis not present

## 2015-12-08 DIAGNOSIS — L609 Nail disorder, unspecified: Secondary | ICD-10-CM | POA: Diagnosis not present

## 2015-12-08 DIAGNOSIS — L84 Corns and callosities: Secondary | ICD-10-CM | POA: Diagnosis not present

## 2016-03-08 DIAGNOSIS — G20 Parkinson's disease: Secondary | ICD-10-CM | POA: Diagnosis not present

## 2016-03-08 DIAGNOSIS — F0280 Dementia in other diseases classified elsewhere without behavioral disturbance: Secondary | ICD-10-CM | POA: Diagnosis not present

## 2016-03-08 DIAGNOSIS — I509 Heart failure, unspecified: Secondary | ICD-10-CM | POA: Diagnosis not present

## 2016-03-08 DIAGNOSIS — I2510 Atherosclerotic heart disease of native coronary artery without angina pectoris: Secondary | ICD-10-CM | POA: Diagnosis not present

## 2016-03-08 DIAGNOSIS — G3183 Dementia with Lewy bodies: Secondary | ICD-10-CM | POA: Diagnosis not present

## 2016-03-08 DIAGNOSIS — F329 Major depressive disorder, single episode, unspecified: Secondary | ICD-10-CM | POA: Diagnosis not present

## 2016-03-09 DIAGNOSIS — G3183 Dementia with Lewy bodies: Secondary | ICD-10-CM | POA: Diagnosis not present

## 2016-03-09 DIAGNOSIS — F0280 Dementia in other diseases classified elsewhere without behavioral disturbance: Secondary | ICD-10-CM | POA: Diagnosis not present

## 2016-03-09 DIAGNOSIS — F329 Major depressive disorder, single episode, unspecified: Secondary | ICD-10-CM | POA: Diagnosis not present

## 2016-03-09 DIAGNOSIS — I2510 Atherosclerotic heart disease of native coronary artery without angina pectoris: Secondary | ICD-10-CM | POA: Diagnosis not present

## 2016-03-09 DIAGNOSIS — I509 Heart failure, unspecified: Secondary | ICD-10-CM | POA: Diagnosis not present

## 2016-03-09 DIAGNOSIS — G20 Parkinson's disease: Secondary | ICD-10-CM | POA: Diagnosis not present

## 2016-03-13 DIAGNOSIS — G20 Parkinson's disease: Secondary | ICD-10-CM | POA: Diagnosis not present

## 2016-03-13 DIAGNOSIS — I509 Heart failure, unspecified: Secondary | ICD-10-CM | POA: Diagnosis not present

## 2016-03-13 DIAGNOSIS — G3183 Dementia with Lewy bodies: Secondary | ICD-10-CM | POA: Diagnosis not present

## 2016-03-13 DIAGNOSIS — F0280 Dementia in other diseases classified elsewhere without behavioral disturbance: Secondary | ICD-10-CM | POA: Diagnosis not present

## 2016-03-13 DIAGNOSIS — I2510 Atherosclerotic heart disease of native coronary artery without angina pectoris: Secondary | ICD-10-CM | POA: Diagnosis not present

## 2016-03-13 DIAGNOSIS — F329 Major depressive disorder, single episode, unspecified: Secondary | ICD-10-CM | POA: Diagnosis not present

## 2016-03-15 DIAGNOSIS — F329 Major depressive disorder, single episode, unspecified: Secondary | ICD-10-CM | POA: Diagnosis not present

## 2016-03-15 DIAGNOSIS — I2510 Atherosclerotic heart disease of native coronary artery without angina pectoris: Secondary | ICD-10-CM | POA: Diagnosis not present

## 2016-03-15 DIAGNOSIS — G3183 Dementia with Lewy bodies: Secondary | ICD-10-CM | POA: Diagnosis not present

## 2016-03-15 DIAGNOSIS — F0280 Dementia in other diseases classified elsewhere without behavioral disturbance: Secondary | ICD-10-CM | POA: Diagnosis not present

## 2016-03-15 DIAGNOSIS — I509 Heart failure, unspecified: Secondary | ICD-10-CM | POA: Diagnosis not present

## 2016-03-15 DIAGNOSIS — G20 Parkinson's disease: Secondary | ICD-10-CM | POA: Diagnosis not present

## 2016-03-16 DIAGNOSIS — G3183 Dementia with Lewy bodies: Secondary | ICD-10-CM | POA: Diagnosis not present

## 2016-03-16 DIAGNOSIS — I509 Heart failure, unspecified: Secondary | ICD-10-CM | POA: Diagnosis not present

## 2016-03-16 DIAGNOSIS — F0280 Dementia in other diseases classified elsewhere without behavioral disturbance: Secondary | ICD-10-CM | POA: Diagnosis not present

## 2016-03-16 DIAGNOSIS — G20 Parkinson's disease: Secondary | ICD-10-CM | POA: Diagnosis not present

## 2016-03-16 DIAGNOSIS — F329 Major depressive disorder, single episode, unspecified: Secondary | ICD-10-CM | POA: Diagnosis not present

## 2016-03-16 DIAGNOSIS — I2510 Atherosclerotic heart disease of native coronary artery without angina pectoris: Secondary | ICD-10-CM | POA: Diagnosis not present

## 2016-03-20 DIAGNOSIS — G20 Parkinson's disease: Secondary | ICD-10-CM | POA: Diagnosis not present

## 2016-03-20 DIAGNOSIS — I2510 Atherosclerotic heart disease of native coronary artery without angina pectoris: Secondary | ICD-10-CM | POA: Diagnosis not present

## 2016-03-20 DIAGNOSIS — F329 Major depressive disorder, single episode, unspecified: Secondary | ICD-10-CM | POA: Diagnosis not present

## 2016-03-20 DIAGNOSIS — G3183 Dementia with Lewy bodies: Secondary | ICD-10-CM | POA: Diagnosis not present

## 2016-03-20 DIAGNOSIS — I509 Heart failure, unspecified: Secondary | ICD-10-CM | POA: Diagnosis not present

## 2016-03-20 DIAGNOSIS — F0280 Dementia in other diseases classified elsewhere without behavioral disturbance: Secondary | ICD-10-CM | POA: Diagnosis not present

## 2016-03-22 DIAGNOSIS — G20 Parkinson's disease: Secondary | ICD-10-CM | POA: Diagnosis not present

## 2016-03-22 DIAGNOSIS — F329 Major depressive disorder, single episode, unspecified: Secondary | ICD-10-CM | POA: Diagnosis not present

## 2016-03-22 DIAGNOSIS — F0280 Dementia in other diseases classified elsewhere without behavioral disturbance: Secondary | ICD-10-CM | POA: Diagnosis not present

## 2016-03-22 DIAGNOSIS — I509 Heart failure, unspecified: Secondary | ICD-10-CM | POA: Diagnosis not present

## 2016-03-22 DIAGNOSIS — G3183 Dementia with Lewy bodies: Secondary | ICD-10-CM | POA: Diagnosis not present

## 2016-03-22 DIAGNOSIS — I2510 Atherosclerotic heart disease of native coronary artery without angina pectoris: Secondary | ICD-10-CM | POA: Diagnosis not present

## 2016-03-27 DIAGNOSIS — F0280 Dementia in other diseases classified elsewhere without behavioral disturbance: Secondary | ICD-10-CM | POA: Diagnosis not present

## 2016-03-27 DIAGNOSIS — G3183 Dementia with Lewy bodies: Secondary | ICD-10-CM | POA: Diagnosis not present

## 2016-03-27 DIAGNOSIS — G20 Parkinson's disease: Secondary | ICD-10-CM | POA: Diagnosis not present

## 2016-03-27 DIAGNOSIS — I2510 Atherosclerotic heart disease of native coronary artery without angina pectoris: Secondary | ICD-10-CM | POA: Diagnosis not present

## 2016-03-27 DIAGNOSIS — F329 Major depressive disorder, single episode, unspecified: Secondary | ICD-10-CM | POA: Diagnosis not present

## 2016-03-27 DIAGNOSIS — I509 Heart failure, unspecified: Secondary | ICD-10-CM | POA: Diagnosis not present

## 2016-03-29 DIAGNOSIS — F329 Major depressive disorder, single episode, unspecified: Secondary | ICD-10-CM | POA: Diagnosis not present

## 2016-03-29 DIAGNOSIS — G20 Parkinson's disease: Secondary | ICD-10-CM | POA: Diagnosis not present

## 2016-03-29 DIAGNOSIS — I509 Heart failure, unspecified: Secondary | ICD-10-CM | POA: Diagnosis not present

## 2016-03-29 DIAGNOSIS — F0280 Dementia in other diseases classified elsewhere without behavioral disturbance: Secondary | ICD-10-CM | POA: Diagnosis not present

## 2016-03-29 DIAGNOSIS — I2510 Atherosclerotic heart disease of native coronary artery without angina pectoris: Secondary | ICD-10-CM | POA: Diagnosis not present

## 2016-03-29 DIAGNOSIS — G3183 Dementia with Lewy bodies: Secondary | ICD-10-CM | POA: Diagnosis not present

## 2016-04-02 DIAGNOSIS — G3183 Dementia with Lewy bodies: Secondary | ICD-10-CM | POA: Diagnosis not present

## 2016-04-02 DIAGNOSIS — I2510 Atherosclerotic heart disease of native coronary artery without angina pectoris: Secondary | ICD-10-CM | POA: Diagnosis not present

## 2016-04-02 DIAGNOSIS — F0280 Dementia in other diseases classified elsewhere without behavioral disturbance: Secondary | ICD-10-CM | POA: Diagnosis not present

## 2016-04-02 DIAGNOSIS — G20 Parkinson's disease: Secondary | ICD-10-CM | POA: Diagnosis not present

## 2016-04-02 DIAGNOSIS — F329 Major depressive disorder, single episode, unspecified: Secondary | ICD-10-CM | POA: Diagnosis not present

## 2016-04-02 DIAGNOSIS — I509 Heart failure, unspecified: Secondary | ICD-10-CM | POA: Diagnosis not present

## 2016-04-05 DIAGNOSIS — I2510 Atherosclerotic heart disease of native coronary artery without angina pectoris: Secondary | ICD-10-CM | POA: Diagnosis not present

## 2016-04-05 DIAGNOSIS — I509 Heart failure, unspecified: Secondary | ICD-10-CM | POA: Diagnosis not present

## 2016-04-05 DIAGNOSIS — G20 Parkinson's disease: Secondary | ICD-10-CM | POA: Diagnosis not present

## 2016-04-05 DIAGNOSIS — G3183 Dementia with Lewy bodies: Secondary | ICD-10-CM | POA: Diagnosis not present

## 2016-04-05 DIAGNOSIS — F329 Major depressive disorder, single episode, unspecified: Secondary | ICD-10-CM | POA: Diagnosis not present

## 2016-04-05 DIAGNOSIS — F0280 Dementia in other diseases classified elsewhere without behavioral disturbance: Secondary | ICD-10-CM | POA: Diagnosis not present

## 2016-04-11 DIAGNOSIS — I2510 Atherosclerotic heart disease of native coronary artery without angina pectoris: Secondary | ICD-10-CM | POA: Diagnosis not present

## 2016-04-11 DIAGNOSIS — G3183 Dementia with Lewy bodies: Secondary | ICD-10-CM | POA: Diagnosis not present

## 2016-04-11 DIAGNOSIS — F329 Major depressive disorder, single episode, unspecified: Secondary | ICD-10-CM | POA: Diagnosis not present

## 2016-04-11 DIAGNOSIS — I509 Heart failure, unspecified: Secondary | ICD-10-CM | POA: Diagnosis not present

## 2016-04-11 DIAGNOSIS — F0280 Dementia in other diseases classified elsewhere without behavioral disturbance: Secondary | ICD-10-CM | POA: Diagnosis not present

## 2016-04-11 DIAGNOSIS — G20 Parkinson's disease: Secondary | ICD-10-CM | POA: Diagnosis not present

## 2016-04-12 DIAGNOSIS — F0280 Dementia in other diseases classified elsewhere without behavioral disturbance: Secondary | ICD-10-CM | POA: Diagnosis not present

## 2016-04-12 DIAGNOSIS — G20 Parkinson's disease: Secondary | ICD-10-CM | POA: Diagnosis not present

## 2016-04-12 DIAGNOSIS — I2510 Atherosclerotic heart disease of native coronary artery without angina pectoris: Secondary | ICD-10-CM | POA: Diagnosis not present

## 2016-04-12 DIAGNOSIS — I509 Heart failure, unspecified: Secondary | ICD-10-CM | POA: Diagnosis not present

## 2016-04-12 DIAGNOSIS — F329 Major depressive disorder, single episode, unspecified: Secondary | ICD-10-CM | POA: Diagnosis not present

## 2016-04-12 DIAGNOSIS — G3183 Dementia with Lewy bodies: Secondary | ICD-10-CM | POA: Diagnosis not present

## 2016-04-13 DIAGNOSIS — I509 Heart failure, unspecified: Secondary | ICD-10-CM | POA: Diagnosis not present

## 2016-04-13 DIAGNOSIS — I2510 Atherosclerotic heart disease of native coronary artery without angina pectoris: Secondary | ICD-10-CM | POA: Diagnosis not present

## 2016-04-13 DIAGNOSIS — F0280 Dementia in other diseases classified elsewhere without behavioral disturbance: Secondary | ICD-10-CM | POA: Diagnosis not present

## 2016-04-13 DIAGNOSIS — G20 Parkinson's disease: Secondary | ICD-10-CM | POA: Diagnosis not present

## 2016-04-13 DIAGNOSIS — G3183 Dementia with Lewy bodies: Secondary | ICD-10-CM | POA: Diagnosis not present

## 2016-04-13 DIAGNOSIS — F329 Major depressive disorder, single episode, unspecified: Secondary | ICD-10-CM | POA: Diagnosis not present

## 2016-04-17 DIAGNOSIS — I509 Heart failure, unspecified: Secondary | ICD-10-CM | POA: Diagnosis not present

## 2016-04-17 DIAGNOSIS — F329 Major depressive disorder, single episode, unspecified: Secondary | ICD-10-CM | POA: Diagnosis not present

## 2016-04-17 DIAGNOSIS — I2510 Atherosclerotic heart disease of native coronary artery without angina pectoris: Secondary | ICD-10-CM | POA: Diagnosis not present

## 2016-04-17 DIAGNOSIS — G3183 Dementia with Lewy bodies: Secondary | ICD-10-CM | POA: Diagnosis not present

## 2016-04-17 DIAGNOSIS — F0280 Dementia in other diseases classified elsewhere without behavioral disturbance: Secondary | ICD-10-CM | POA: Diagnosis not present

## 2016-04-17 DIAGNOSIS — G20 Parkinson's disease: Secondary | ICD-10-CM | POA: Diagnosis not present

## 2016-04-19 DIAGNOSIS — I509 Heart failure, unspecified: Secondary | ICD-10-CM | POA: Diagnosis not present

## 2016-04-19 DIAGNOSIS — F0280 Dementia in other diseases classified elsewhere without behavioral disturbance: Secondary | ICD-10-CM | POA: Diagnosis not present

## 2016-04-19 DIAGNOSIS — F329 Major depressive disorder, single episode, unspecified: Secondary | ICD-10-CM | POA: Diagnosis not present

## 2016-04-19 DIAGNOSIS — G20 Parkinson's disease: Secondary | ICD-10-CM | POA: Diagnosis not present

## 2016-04-19 DIAGNOSIS — I2510 Atherosclerotic heart disease of native coronary artery without angina pectoris: Secondary | ICD-10-CM | POA: Diagnosis not present

## 2016-04-19 DIAGNOSIS — G3183 Dementia with Lewy bodies: Secondary | ICD-10-CM | POA: Diagnosis not present

## 2016-04-26 DIAGNOSIS — I509 Heart failure, unspecified: Secondary | ICD-10-CM | POA: Diagnosis not present

## 2016-04-26 DIAGNOSIS — G20 Parkinson's disease: Secondary | ICD-10-CM | POA: Diagnosis not present

## 2016-04-26 DIAGNOSIS — F0280 Dementia in other diseases classified elsewhere without behavioral disturbance: Secondary | ICD-10-CM | POA: Diagnosis not present

## 2016-04-26 DIAGNOSIS — F329 Major depressive disorder, single episode, unspecified: Secondary | ICD-10-CM | POA: Diagnosis not present

## 2016-04-26 DIAGNOSIS — G3183 Dementia with Lewy bodies: Secondary | ICD-10-CM | POA: Diagnosis not present

## 2016-04-26 DIAGNOSIS — I2510 Atherosclerotic heart disease of native coronary artery without angina pectoris: Secondary | ICD-10-CM | POA: Diagnosis not present

## 2016-05-03 DIAGNOSIS — I509 Heart failure, unspecified: Secondary | ICD-10-CM | POA: Diagnosis not present

## 2016-05-03 DIAGNOSIS — F329 Major depressive disorder, single episode, unspecified: Secondary | ICD-10-CM | POA: Diagnosis not present

## 2016-05-03 DIAGNOSIS — G20 Parkinson's disease: Secondary | ICD-10-CM | POA: Diagnosis not present

## 2016-05-03 DIAGNOSIS — I2510 Atherosclerotic heart disease of native coronary artery without angina pectoris: Secondary | ICD-10-CM | POA: Diagnosis not present

## 2016-05-03 DIAGNOSIS — G3183 Dementia with Lewy bodies: Secondary | ICD-10-CM | POA: Diagnosis not present

## 2016-05-03 DIAGNOSIS — F0280 Dementia in other diseases classified elsewhere without behavioral disturbance: Secondary | ICD-10-CM | POA: Diagnosis not present

## 2016-05-29 ENCOUNTER — Encounter: Payer: Self-pay | Admitting: Gynecology

## 2016-05-29 ENCOUNTER — Ambulatory Visit
Admission: EM | Admit: 2016-05-29 | Discharge: 2016-05-29 | Disposition: A | Discharge status: Home / Self Care | Payer: Medicare Other | Attending: Family Medicine | Admitting: Family Medicine

## 2016-05-29 DIAGNOSIS — G20 Parkinson's disease: Secondary | ICD-10-CM | POA: Diagnosis not present

## 2016-05-29 DIAGNOSIS — Z7982 Long term (current) use of aspirin: Secondary | ICD-10-CM | POA: Insufficient documentation

## 2016-05-29 DIAGNOSIS — I110 Hypertensive heart disease with heart failure: Secondary | ICD-10-CM | POA: Insufficient documentation

## 2016-05-29 DIAGNOSIS — I739 Peripheral vascular disease, unspecified: Secondary | ICD-10-CM | POA: Insufficient documentation

## 2016-05-29 DIAGNOSIS — Z950 Presence of cardiac pacemaker: Secondary | ICD-10-CM | POA: Insufficient documentation

## 2016-05-29 DIAGNOSIS — I480 Paroxysmal atrial fibrillation: Secondary | ICD-10-CM | POA: Insufficient documentation

## 2016-05-29 DIAGNOSIS — Z85828 Personal history of other malignant neoplasm of skin: Secondary | ICD-10-CM | POA: Diagnosis not present

## 2016-05-29 DIAGNOSIS — R300 Dysuria: Secondary | ICD-10-CM | POA: Insufficient documentation

## 2016-05-29 DIAGNOSIS — Z87891 Personal history of nicotine dependence: Secondary | ICD-10-CM | POA: Insufficient documentation

## 2016-05-29 DIAGNOSIS — Z66 Do not resuscitate: Secondary | ICD-10-CM | POA: Insufficient documentation

## 2016-05-29 DIAGNOSIS — E785 Hyperlipidemia, unspecified: Secondary | ICD-10-CM | POA: Diagnosis not present

## 2016-05-29 DIAGNOSIS — Z79899 Other long term (current) drug therapy: Secondary | ICD-10-CM | POA: Insufficient documentation

## 2016-05-29 DIAGNOSIS — F329 Major depressive disorder, single episode, unspecified: Secondary | ICD-10-CM | POA: Diagnosis not present

## 2016-05-29 DIAGNOSIS — F419 Anxiety disorder, unspecified: Secondary | ICD-10-CM | POA: Insufficient documentation

## 2016-05-29 DIAGNOSIS — I509 Heart failure, unspecified: Secondary | ICD-10-CM | POA: Insufficient documentation

## 2016-05-29 HISTORY — DX: Major depressive disorder, single episode, unspecified: F32.9

## 2016-05-29 HISTORY — DX: Parkinson's disease without dyskinesia, without mention of fluctuations: G20.A1

## 2016-05-29 HISTORY — DX: Other specified disorders of kidney and ureter: N28.89

## 2016-05-29 HISTORY — DX: Peripheral vascular disease, unspecified: I73.9

## 2016-05-29 HISTORY — DX: Depression, unspecified: F32.A

## 2016-05-29 HISTORY — DX: Essential (primary) hypertension: I10

## 2016-05-29 HISTORY — DX: Do not resuscitate: Z66

## 2016-05-29 HISTORY — DX: Malignant (primary) neoplasm, unspecified: C80.1

## 2016-05-29 HISTORY — DX: Encounter for adjustment and management of other part of cardiac pacemaker: Z45.018

## 2016-05-29 HISTORY — DX: Anxiety disorder, unspecified: F41.9

## 2016-05-29 HISTORY — DX: Unspecified malignant neoplasm of skin, unspecified: C44.90

## 2016-05-29 HISTORY — DX: Hyperlipidemia, unspecified: E78.5

## 2016-05-29 HISTORY — DX: Hemiplegia and hemiparesis following cerebral infarction affecting left non-dominant side: I69.354

## 2016-05-29 HISTORY — DX: Unspecified osteoarthritis, unspecified site: M19.90

## 2016-05-29 HISTORY — DX: Parkinson's disease: G20

## 2016-05-29 HISTORY — DX: Paroxysmal atrial fibrillation: I48.0

## 2016-05-29 HISTORY — DX: Polyp of colon: K63.5

## 2016-05-29 HISTORY — DX: Heart failure, unspecified: I50.9

## 2016-05-29 LAB — URINALYSIS COMPLETE WITH MICROSCOPIC (ARMC ONLY)
GLUCOSE, UA: NEGATIVE mg/dL
Hgb urine dipstick: NEGATIVE
Ketones, ur: NEGATIVE mg/dL
Leukocytes, UA: NEGATIVE
Nitrite: NEGATIVE
PROTEIN: NEGATIVE mg/dL
SPECIFIC GRAVITY, URINE: 1.01 (ref 1.005–1.030)
pH: 6 (ref 5.0–8.0)

## 2016-05-29 MED ORDER — PHENAZOPYRIDINE HCL 200 MG PO TABS: 200.0000 mg | ORAL_TABLET | Freq: Three times a day (TID) | ORAL | Status: DC

## 2016-05-29 MED ORDER — TAMSULOSIN HCL 0.4 MG PO CAPS: 0.4000 mg | ORAL_CAPSULE | Freq: Every morning | ORAL | Status: DC

## 2016-05-29 NOTE — ED Provider Notes (Signed)
CSN: [id number]     Arrival date & time 05/29/16  1935 History   First MD Initiated Contact with Patient 05/29/16 2110     Chief Complaint  Patient presents with  . Urinary Tract Infection   (Consider location/radiation/quality/duration/timing/severity/associated sxs/prior Treatment) HPI   This is an 82 year old male who is brought in by his son Complaining of burning with urination towards the end of micturition for last couple of days. Is no penile discharge. Has run no fevers and does not have chills. Is a history of adenocarcinoma of the prostate that has been followed for years according to the son. His son also states that his father has been anxious lately and has a tendency of somaticize  his anxiety which is what he thinks may be happening.  Past Medical History  Diagnosis Date  . Adenocarcinoma ([place])   . Peripheral arterial disease ([place])   . Paroxysmal atrial fibrillation (HCC)   . Hemiparesis affecting left side as late effect of cerebrovascular accident (CVA) ([place])   . Parkinson disease ([place])   . Hyperlipemia   . Hypertension   . Degenerative joint disease   . Depression   . Anxiety   . Fitting and adjustment of cardiac pacemaker   . CHF (congestive heart failure) ([place])   . DNR (do not resuscitate)   . Bilateral renal masses   . Colon polyp   . Skin cancer    Past Surgical History  Procedure Laterality Date  . Insert / replace/ remove peacemaker     No family history on file. Social History  Substance Use Topics  . Smoking status: Former Research scientist (life sciences)  . Smokeless tobacco: None  . Alcohol Use: No    Review of Systems  Constitutional: Positive for activity change. Negative for fever, chills and fatigue.  Genitourinary: Positive for dysuria. Negative for urgency, frequency and hematuria.  All other systems reviewed and are negative.   Allergies  Bee venom  Home Medications   Prior to Admission medications   Medication Sig Start Date End Date Taking?  Authorizing Provider  alfuzosin (UROXATRAL) 10 MG 24 hr tablet Take 10 mg by mouth daily with breakfast.   Yes Historical Provider, MD  aspirin 81 MG tablet Take 81 mg by mouth daily.   Yes Historical Provider, MD  atorvastatin (LIPITOR) 20 MG tablet Take 20 mg by mouth daily.   Yes Historical Provider, MD  calcium carbonate (OSCAL) 1500 (600 Ca) MG TABS tablet Take by mouth 2 (two) times daily with a meal.   Yes Historical Provider, MD  carbidopa-levodopa (SINEMET IR) 25-100 MG tablet Take by mouth 3 (three) times daily.   Yes Historical Provider, MD  Cholecalciferol 10000 units TABS Take by mouth.   Yes Historical Provider, MD  DULoxetine (CYMBALTA) 60 MG capsule Take 60 mg by mouth daily.   Yes Historical Provider, MD  dutasteride (AVODART) 0.5 MG capsule Take 0.5 mg by mouth daily.   Yes Historical Provider, MD  fluticasone (FLONASE) 50 MCG/ACT nasal spray Place into both nostrils daily.   Yes Historical Provider, MD  gabapentin (NEURONTIN) 600 MG tablet Take 600 mg by mouth 3 (three) times daily.   Yes Historical Provider, MD  lisinopril (PRINIVIL,ZESTRIL) 2.5 MG tablet Take 2.5 mg by mouth daily.   Yes Historical Provider, MD  metoprolol succinate (TOPROL-XL) 25 MG 24 hr tablet Take 25 mg by mouth daily.   Yes Historical Provider, MD  Multiple Vitamin (MULTIVITAMIN) capsule Take 1 capsule by mouth daily.   Yes Historical  Provider, MD  nystatin [facility]) powder Apply topically 4 (four) times daily.   Yes Historical Provider, MD  OLANZapine (ZYPREXA) 5 MG tablet Take by mouth at bedtime.   Yes Historical Provider, MD  polyethylene glycol (MIRALAX / GLYCOLAX) packet Take 17 g by mouth daily.   Yes Historical Provider, MD  sennosides-docusate sodium (SENOKOT-S) 8.6-50 MG tablet Take 1 tablet by mouth daily.   Yes Historical Provider, MD  phenazopyridine (PYRIDIUM) 200 MG tablet Take 1 tablet (200 mg total) by mouth 3 (three) times daily. 05/29/16   Lorin Picket, PA-C  tamsulosin (FLOMAX) 0.4 MG  CAPS capsule Take 1 capsule (0.4 mg total) by mouth every morning. 05/29/16   Lorin Picket, PA-C   Meds Ordered and Administered this Visit  Medications - No data to display  BP 138/71 mmHg  Pulse 82  Temp(Src) 98.2 F (36.8 C) (Oral)  Resp 16  Ht 6' (1.829 m)  Wt 195 lb (88.451 kg)  BMI 26.44 kg/m2  SpO2 99% No data found.   Physical Exam  Constitutional: He is oriented to person, place, and time. He appears well-developed and well-nourished. No distress.  HENT:  Head: Normocephalic and atraumatic.  Eyes: Conjunctivae are normal. Pupils are equal, round, and reactive to light.  Neck: Normal range of motion. Neck supple.  Abdominal: Soft. Bowel sounds are normal. He exhibits no distension. There is no tenderness. There is no rebound and no guarding.  Musculoskeletal: Normal range of motion.  Neurological: He is alert and oriented to person, place, and time.  Skin: Skin is warm and dry. He is not diaphoretic.  Psychiatric: He has a normal mood and affect. His behavior is normal. Judgment and thought content normal.  Nursing note and vitals reviewed.   ED Course  Procedures (including critical care time)  Labs Review Labs Reviewed  URINALYSIS COMPLETEWITH MICROSCOPIC ([facility] ONLY) - Abnormal; Notable for the following:    Bilirubin Urine 3+ (*)    Bacteria, UA RARE (*)    Squamous Epithelial / LPF 0-5 (*)    All other components within normal limits    Imaging Review No results found.   Visual Acuity Review  Right Eye Distance:   Left Eye Distance:   Bilateral Distance:    Right Eye Near:   Left Eye Near:    Bilateral Near:         MDM   1. Dysuria    Discharge Medication List as of 05/29/2016  9:33 PM    START taking these medications   Details  phenazopyridine (PYRIDIUM) 200 MG tablet Take 1 tablet (200 mg total) by mouth 3 (three) times daily., Starting 05/29/2016, Until Discontinued, Normal    tamsulosin (FLOMAX) 0.4 MG CAPS capsule Take 1 capsule  (0.4 mg total) by mouth every morning., Starting 05/29/2016, Until Discontinued, Normal      Plan: 1. Test/x-ray results and diagnosis reviewed with patient 2. rx as per orders; risks, benefits, potential side effects reviewed with patient 3. Recommend supportive treatment with Use of Pyridium for the dysuria. His urinalysis does not show evidence of a urinary tract infection but we will culture it to rule out definitively. We will hold off on treatment until that is available. In the meantime we'll treat him symptomatically with Pyridium and Flonase. They will call in 48 hours for results of the C and S 4. F/u prn if symptoms worsen or don't improve     Lorin Picket, PA-C 05/29/16 2147

## 2016-05-29 NOTE — Discharge Instructions (Signed)

## 2016-05-29 NOTE — ED Notes (Signed)
Patient stated burning with urination x couple days.

## 2016-06-23 ENCOUNTER — Encounter: Payer: Self-pay | Admitting: Emergency Medicine

## 2016-06-23 ENCOUNTER — Emergency Department
Admission: EM | Admit: 2016-06-23 | Discharge: 2016-06-24 | Disposition: A | Discharge status: Home / Self Care | Payer: Medicare Other | Attending: Emergency Medicine | Admitting: Emergency Medicine

## 2016-06-23 DIAGNOSIS — F919 Conduct disorder, unspecified: Secondary | ICD-10-CM | POA: Diagnosis present

## 2016-06-23 DIAGNOSIS — Z87891 Personal history of nicotine dependence: Secondary | ICD-10-CM | POA: Insufficient documentation

## 2016-06-23 DIAGNOSIS — F0281 Dementia in other diseases classified elsewhere with behavioral disturbance: Secondary | ICD-10-CM | POA: Diagnosis not present

## 2016-06-23 DIAGNOSIS — Z79899 Other long term (current) drug therapy: Secondary | ICD-10-CM | POA: Diagnosis not present

## 2016-06-23 DIAGNOSIS — E860 Dehydration: Secondary | ICD-10-CM | POA: Diagnosis not present

## 2016-06-23 DIAGNOSIS — G3183 Dementia with Lewy bodies: Secondary | ICD-10-CM | POA: Insufficient documentation

## 2016-06-23 DIAGNOSIS — G20 Parkinson's disease: Secondary | ICD-10-CM

## 2016-06-23 DIAGNOSIS — Z8673 Personal history of transient ischemic attack (TIA), and cerebral infarction without residual deficits: Secondary | ICD-10-CM | POA: Diagnosis not present

## 2016-06-23 DIAGNOSIS — F329 Major depressive disorder, single episode, unspecified: Secondary | ICD-10-CM | POA: Insufficient documentation

## 2016-06-23 DIAGNOSIS — I480 Paroxysmal atrial fibrillation: Secondary | ICD-10-CM | POA: Insufficient documentation

## 2016-06-23 DIAGNOSIS — Z8679 Personal history of other diseases of the circulatory system: Secondary | ICD-10-CM | POA: Insufficient documentation

## 2016-06-23 DIAGNOSIS — Z7951 Long term (current) use of inhaled steroids: Secondary | ICD-10-CM | POA: Diagnosis not present

## 2016-06-23 DIAGNOSIS — Z7982 Long term (current) use of aspirin: Secondary | ICD-10-CM | POA: Diagnosis not present

## 2016-06-23 DIAGNOSIS — I110 Hypertensive heart disease with heart failure: Secondary | ICD-10-CM | POA: Diagnosis not present

## 2016-06-23 DIAGNOSIS — R4689 Other symptoms and signs involving appearance and behavior: Secondary | ICD-10-CM

## 2016-06-23 DIAGNOSIS — F918 Other conduct disorders: Secondary | ICD-10-CM | POA: Diagnosis not present

## 2016-06-23 DIAGNOSIS — F99 Mental disorder, not otherwise specified: Secondary | ICD-10-CM | POA: Diagnosis not present

## 2016-06-23 DIAGNOSIS — F0280 Dementia in other diseases classified elsewhere without behavioral disturbance: Secondary | ICD-10-CM

## 2016-06-23 DIAGNOSIS — I4891 Unspecified atrial fibrillation: Secondary | ICD-10-CM

## 2016-06-23 DIAGNOSIS — F02818 Dementia in other diseases classified elsewhere, unspecified severity, with other behavioral disturbance: Secondary | ICD-10-CM

## 2016-06-23 DIAGNOSIS — I509 Heart failure, unspecified: Secondary | ICD-10-CM | POA: Insufficient documentation

## 2016-06-23 DIAGNOSIS — Z85828 Personal history of other malignant neoplasm of skin: Secondary | ICD-10-CM | POA: Diagnosis not present

## 2016-06-23 DIAGNOSIS — E785 Hyperlipidemia, unspecified: Secondary | ICD-10-CM | POA: Insufficient documentation

## 2016-06-23 DIAGNOSIS — I10 Essential (primary) hypertension: Secondary | ICD-10-CM

## 2016-06-23 LAB — COMPREHENSIVE METABOLIC PANEL
ALT: 7 U/L — AB (ref 17–63)
ANION GAP: 12 (ref 5–15)
AST: 28 U/L (ref 15–41)
Albumin: 4.1 g/dL (ref 3.5–5.0)
Alkaline Phosphatase: 47 U/L (ref 38–126)
BUN: 31 mg/dL — ABNORMAL HIGH (ref 6–20)
CHLORIDE: 105 mmol/L (ref 101–111)
CO2: 23 mmol/L (ref 22–32)
Calcium: 9.9 mg/dL (ref 8.9–10.3)
Creatinine, Ser: 2 mg/dL — ABNORMAL HIGH (ref 0.61–1.24)
GFR, EST AFRICAN AMERICAN: 33 mL/min — AB (ref >60)
GFR, EST NON AFRICAN AMERICAN: 29 mL/min — AB (ref >60)
Glucose, Bld: 128 mg/dL — ABNORMAL HIGH (ref 65–99)
POTASSIUM: 4.4 mmol/L (ref 3.5–5.1)
SODIUM: 140 mmol/L (ref 135–145)
Total Bilirubin: 1.4 mg/dL — ABNORMAL HIGH (ref 0.3–1.2)
Total Protein: 7.4 g/dL (ref 6.5–8.1)

## 2016-06-23 LAB — URINALYSIS COMPLETE WITH MICROSCOPIC (ARMC ONLY)
BILIRUBIN URINE: NEGATIVE
GLUCOSE, UA: NEGATIVE mg/dL
HGB URINE DIPSTICK: NEGATIVE
Leukocytes, UA: NEGATIVE
NITRITE: NEGATIVE
PH: 5 (ref 5.0–8.0)
Protein, ur: 30 mg/dL — AB
SPECIFIC GRAVITY, URINE: 1.025 (ref 1.005–1.030)

## 2016-06-23 LAB — CBC WITH DIFFERENTIAL/PLATELET
BASOS ABS: 0 10*3/uL (ref 0–0.1)
Basophils Relative: 0 %
EOS PCT: 0 %
Eosinophils Absolute: 0 10*3/uL (ref 0–0.7)
HCT: 42 % (ref 40.0–52.0)
HEMOGLOBIN: 14.1 g/dL (ref 13.0–18.0)
LYMPHS ABS: 0.5 10*3/uL — AB (ref 1.0–3.6)
LYMPHS PCT: 8 %
MCH: 31.7 pg (ref 26.0–34.0)
MCHC: 33.5 g/dL (ref 32.0–36.0)
MCV: 94.4 fL (ref 80.0–100.0)
Monocytes Absolute: 0.4 10*3/uL (ref 0.2–1.0)
Monocytes Relative: 5 %
NEUTROS ABS: 6.4 10*3/uL (ref 1.4–6.5)
NEUTROS PCT: 87 %
PLATELETS: 170 10*3/uL (ref 150–440)
RBC: 4.45 MIL/uL (ref 4.40–5.90)
RDW: 15.2 % — ABNORMAL HIGH (ref 11.5–14.5)
WBC: 7.3 10*3/uL (ref 3.8–10.6)

## 2016-06-23 LAB — URINE DRUG SCREEN, QUALITATIVE (ARMC ONLY)
AMPHETAMINES, UR SCREEN: NOT DETECTED
Barbiturates, Ur Screen: NOT DETECTED
Benzodiazepine, Ur Scrn: NOT DETECTED
CANNABINOID 50 NG, UR ~~LOC~~: NOT DETECTED
COCAINE METABOLITE, UR ~~LOC~~: NOT DETECTED
MDMA (ECSTASY) UR SCREEN: NOT DETECTED
Methadone Scn, Ur: NOT DETECTED
Opiate, Ur Screen: NOT DETECTED
PHENCYCLIDINE (PCP) UR S: NOT DETECTED
Tricyclic, Ur Screen: NOT DETECTED

## 2016-06-23 LAB — ETHANOL

## 2016-06-23 NOTE — ED Provider Notes (Signed)
[facility] Emergency Department Provider Note    ____________________________________________  Time seen: ~1520  I have reviewed the triage vital signs and the nursing notes.   HISTORY  Chief Complaint Psychiatric Evaluation   History limited by: Not Limited   HPI Danny Grimes is a 82 y.o. male who presents to the emergency department today because he was found walking in the street. He states he was walking in the street because he wanted attention. He says that he does not like where he is living. His primary complaint to me was that they restrict his telephone use. He says that he walked in the street so he could be taken some are worse they would listen to him. He denies any thoughts of self-harm or suicidal ideation. Denies any thoughts of wanting hurt anyone else. He says that he has not had any fevers, chest pain shortness of breath. He does have some constipation which appears to be a chronic issue.    Past Medical History  Diagnosis Date  . Adenocarcinoma ([place])   . Peripheral arterial disease ([place])   . Paroxysmal atrial fibrillation (HCC)   . Hemiparesis affecting left side as late effect of cerebrovascular accident (CVA) ([place])   . Parkinson disease ([place])   . Hyperlipemia   . Hypertension   . Degenerative joint disease   . Depression   . Anxiety   . Fitting and adjustment of cardiac pacemaker   . CHF (congestive heart failure) ([place])   . DNR (do not resuscitate)   . Bilateral renal masses   . Colon polyp   . Skin cancer     There are no active problems to display for this patient.   Past Surgical History  Procedure Laterality Date  . Insert / replace/ remove peacemaker      Current Outpatient Rx  Name  Route  Sig  Dispense  Refill  . alfuzosin (UROXATRAL) 10 MG 24 hr tablet   Oral   Take 10 mg by mouth daily with breakfast.         . aspirin 81 MG tablet   Oral   Take 81 mg by mouth daily.         Marland Kitchen atorvastatin  (LIPITOR) 20 MG tablet   Oral   Take 20 mg by mouth daily.         . calcium carbonate (OSCAL) 1500 (600 Ca) MG TABS tablet   Oral   Take by mouth 2 (two) times daily with a meal.         . carbidopa-levodopa (SINEMET IR) 25-100 MG tablet   Oral   Take by mouth 3 (three) times daily.         . Cholecalciferol 10000 units TABS   Oral   Take by mouth.         . DULoxetine (CYMBALTA) 60 MG capsule   Oral   Take 60 mg by mouth daily.         Marland Kitchen dutasteride (AVODART) 0.5 MG capsule   Oral   Take 0.5 mg by mouth daily.         . fluticasone (FLONASE) 50 MCG/ACT nasal spray   Each Nare   Place into both nostrils daily.         Marland Kitchen gabapentin (NEURONTIN) 600 MG tablet   Oral   Take 600 mg by mouth 3 (three) times daily.         Marland Kitchen lisinopril (PRINIVIL,ZESTRIL) 2.5 MG tablet   Oral  Take 2.5 mg by mouth daily.         . metoprolol succinate (TOPROL-XL) 25 MG 24 hr tablet   Oral   Take 25 mg by mouth daily.         . Multiple Vitamin (MULTIVITAMIN) capsule   Oral   Take 1 capsule by mouth daily.         Marland Kitchen nystatin [facility]) powder   Topical   Apply topically 4 (four) times daily.         Marland Kitchen OLANZapine (ZYPREXA) 5 MG tablet   Oral   Take by mouth at bedtime.         . phenazopyridine (PYRIDIUM) 200 MG tablet   Oral   Take 1 tablet (200 mg total) by mouth 3 (three) times daily.   6 tablet   0   . polyethylene glycol (MIRALAX / GLYCOLAX) packet   Oral   Take 17 g by mouth daily.         . sennosides-docusate sodium (SENOKOT-S) 8.6-50 MG tablet   Oral   Take 1 tablet by mouth daily.         . tamsulosin (FLOMAX) 0.4 MG CAPS capsule   Oral   Take 1 capsule (0.4 mg total) by mouth every morning.   30 capsule   0     Allergies Bee venom  No family history on file.  Social History Social History  Substance Use Topics  . Smoking status: Former Research scientist (life sciences)  . Smokeless tobacco: None  . Alcohol Use: No    Review of  Systems  Constitutional: Negative for fever. Cardiovascular: Negative for chest pain. Respiratory: Negative for shortness of breath. Gastrointestinal: Negative for abdominal pain, vomiting and diarrhea. Neurological: Negative for headaches, focal weakness or numbness.  10-point ROS otherwise negative.  ____________________________________________   PHYSICAL EXAM:  VITAL SIGNS: ED Triage Vitals  Enc Vitals Group     BP --      Pulse --      Resp --      Temp --      Temp src --      SpO2 --      Weight 06/23/16 1425 194 lb (87.998 kg)     Height 06/23/16 1425 5\' 11"  (1.803 m)     Head Cir --      Peak Flow --      Pain Score 06/23/16 1442 3   Constitutional: Alert and oriented. Well appearing and in no distress. Eyes: Conjunctivae are normal. PERRL. Normal extraocular movements. ENT   Head: Normocephalic and atraumatic.   Nose: No congestion/rhinnorhea.   Mouth/Throat: Mucous membranes are moist.   Neck: No stridor. Hematological/Lymphatic/Immunilogical: No cervical lymphadenopathy. Cardiovascular: Normal rate, regular rhythm.  No murmurs, rubs, or gallops. Respiratory: Normal respiratory effort without tachypnea nor retractions. Breath sounds are clear and equal bilaterally. No wheezes/rales/rhonchi. Gastrointestinal: Soft and nontender. No distention.  Genitourinary: Deferred Musculoskeletal: Normal range of motion in all extremities. No joint effusions.  No lower extremity tenderness nor edema. Neurologic:  Normal speech and language. No gross focal neurologic deficits are appreciated.  Skin:  Skin is warm, dry and intact. No rash noted. Psychiatric: Mood and affect are normal. Speech and behavior are normal. Patient exhibits appropriate insight and judgment.  ____________________________________________    LABS (pertinent positives/negatives)  Labs Reviewed  COMPREHENSIVE METABOLIC PANEL - Abnormal; Notable for the following:    Glucose, Bld 128  (*)    BUN 31 (*)    Creatinine, Ser 2.00 (*)  ALT 7 (*)    Total Bilirubin 1.4 (*)    GFR calc non Af Amer 29 (*)    GFR calc Af Amer 33 (*)    All other components within normal limits  CBC WITH DIFFERENTIAL/PLATELET - Abnormal; Notable for the following:    RDW 15.2 (*)    Lymphs Abs 0.5 (*)    All other components within normal limits  URINALYSIS COMPLETEWITH MICROSCOPIC ([facility] ONLY) - Abnormal; Notable for the following:    Color, Urine AMBER (*)    APPearance HAZY (*)    Ketones, ur TRACE (*)    Protein, ur 30 (*)    Bacteria, UA RARE (*)    Squamous Epithelial / LPF 0-5 (*)    All other components within normal limits  ETHANOL  URINE DRUG SCREEN, QUALITATIVE ([facility] ONLY)     ____________________________________________   EKG  None  ____________________________________________    RADIOLOGY  None  ____________________________________________   PROCEDURES  Procedure(s) performed: None  Critical Care performed: No  ____________________________________________   INITIAL IMPRESSION / ASSESSMENT AND PLAN / ED COURSE  Pertinent labs & imaging results that were available during my care of the patient were reviewed by me and considered in my medical decision making (see chart for details).  Patient was found wandering the street today he says because he wanted attention because he does not like where he is living. He denies any thoughts of self-harm. States he is slightly constipated. No concerning findings on physical exam. Blood work was sent. Will see if we cannot get in touch with the son.   ----------------------------------------- 9:27 PM on 06/23/2016 ----------------------------------------- Patient's blood work and urine without concerning findings save for mildly elevated creatinine however we do not have a baseline. The son was reached by nursing staff he stated he has noticed a decline in the patient's behavior for the past couple of weeks. Given  abnormal behavior and patient found walking in the middle of the street will have psychiatry evaluate.   ____________________________________________   FINAL CLINICAL IMPRESSION(S) / ED DIAGNOSES  Final diagnoses:  Abnormal behavior     Note: This dictation was prepared with Dragon dictation. Any transcriptional errors that result from this process are unintentional    Nance Pear, MD 06/23/16 2128

## 2016-06-23 NOTE — ED Notes (Signed)

## 2016-06-23 NOTE — ED Notes (Signed)
[name] (son) [phone] POA Son reports pt behavior has gotten worse over the past 3 weeks states he has been calling him in the middle of the night and stating he needs to go to the hospital and states he thinks he is hallucinating. Son states he was going to take him to the [place] today. Son states he has also not been taking his medications son states he wants him evaluated by psychiatry

## 2016-06-23 NOTE — ED Notes (Signed)

## 2016-06-23 NOTE — ED Notes (Addendum)
BIB EMS from [facility]. EMS reports pt has been wandering in the road. Pt denies SI states he went in the road not to hurt himself, but because he needed to get away from the people in the home and he states he knew that was the only way he could get hear. Alert and oriented to person and place

## 2016-06-23 NOTE — ED Notes (Signed)
Urged patient to urinate.

## 2016-06-23 NOTE — ED Notes (Signed)
Dinner is served

## 2016-06-24 ENCOUNTER — Encounter: Payer: Self-pay | Admitting: Psychiatry

## 2016-06-24 DIAGNOSIS — I10 Essential (primary) hypertension: Secondary | ICD-10-CM

## 2016-06-24 DIAGNOSIS — G20 Parkinson's disease: Secondary | ICD-10-CM

## 2016-06-24 DIAGNOSIS — I509 Heart failure, unspecified: Secondary | ICD-10-CM

## 2016-06-24 DIAGNOSIS — F0281 Dementia in other diseases classified elsewhere with behavioral disturbance: Secondary | ICD-10-CM | POA: Diagnosis not present

## 2016-06-24 DIAGNOSIS — I4891 Unspecified atrial fibrillation: Secondary | ICD-10-CM

## 2016-06-24 DIAGNOSIS — E785 Hyperlipidemia, unspecified: Secondary | ICD-10-CM

## 2016-06-24 DIAGNOSIS — F0280 Dementia in other diseases classified elsewhere without behavioral disturbance: Secondary | ICD-10-CM

## 2016-06-24 LAB — BASIC METABOLIC PANEL
ANION GAP: 9 (ref 5–15)
BUN: 32 mg/dL — ABNORMAL HIGH (ref 6–20)
CALCIUM: 9.7 mg/dL (ref 8.9–10.3)
CHLORIDE: 104 mmol/L (ref 101–111)
CO2: 24 mmol/L (ref 22–32)
Creatinine, Ser: 1.6 mg/dL — ABNORMAL HIGH (ref 0.61–1.24)
GFR calc Af Amer: 44 mL/min — ABNORMAL LOW (ref >60)
GFR calc non Af Amer: 38 mL/min — ABNORMAL LOW (ref >60)
GLUCOSE: 135 mg/dL — AB (ref 65–99)
POTASSIUM: 4.2 mmol/L (ref 3.5–5.1)
Sodium: 137 mmol/L (ref 135–145)

## 2016-06-24 MED ORDER — ACETAMINOPHEN 325 MG PO TABS
650.0000 mg | ORAL_TABLET | Freq: Once | ORAL | Status: AC
  Administered 2016-06-24: 650 mg via ORAL

## 2016-06-24 MED ORDER — HYDROXYZINE HCL 25 MG PO TABS: 50.0000 mg | ORAL_TABLET | Freq: Once | ORAL | Status: DC

## 2016-06-24 MED ORDER — ACETAMINOPHEN 325 MG PO TABS
ORAL_TABLET | ORAL | Status: AC
  Administered 2016-06-24: 650 mg via ORAL
  Filled 2016-06-24: qty 2

## 2016-06-24 MED ORDER — HYDROXYZINE HCL 25 MG PO TABS
50.0000 mg | ORAL_TABLET | Freq: Once | ORAL | Status: AC
  Administered 2016-06-24: 50 mg via ORAL
  Filled 2016-06-24: qty 2

## 2016-06-24 MED ORDER — OLANZAPINE 5 MG PO TABS: ORAL_TABLET | ORAL | Status: DC

## 2016-06-24 MED ORDER — HYDROXYZINE HCL 25 MG PO TABS
50.0000 mg | ORAL_TABLET | Freq: Once | ORAL | Status: AC
  Administered 2016-06-24: 50 mg via ORAL

## 2016-06-24 MED ORDER — HYDROXYZINE HCL 25 MG PO TABS
ORAL_TABLET | ORAL | Status: AC
  Administered 2016-06-24: 50 mg via ORAL
  Filled 2016-06-24: qty 2

## 2016-06-24 MED ORDER — CARBIDOPA-LEVODOPA ER 25-100 MG PO TBCR
1.0000 | EXTENDED_RELEASE_TABLET | ORAL | Status: AC
  Administered 2016-06-24: 1 via ORAL
  Filled 2016-06-24: qty 1

## 2016-06-24 NOTE — ED Notes (Signed)
Son arrived to pick up pt. States he doesn't agree with pt being discharged and believes he should be in a psych facility. Reminded him that he had spoken to calvin earlier and that this was the plan ie: [facility] with increase in his risperdal. Pt released to son.

## 2016-06-24 NOTE — ED Notes (Signed)

## 2016-06-24 NOTE — ED Notes (Signed)

## 2016-06-24 NOTE — ED Notes (Signed)

## 2016-06-24 NOTE — ED Provider Notes (Signed)
-----------------------------------------   5:46 AM on 06/24/2016 -----------------------------------------   Blood pressure 120/85, pulse 74, temperature 99.1 F (37.3 C), temperature source Oral, resp. rate 20, height 5\' 11"  (1.803 m), weight 194 lb (87.998 kg), SpO2 96 %.  The patient had no acute events since last update.  Calm and cooperative at this time.  Disposition is pending per Psychiatry/Behavioral Medicine team recommendations.     Paulette Blanch, MD 06/24/16 [phone]

## 2016-06-24 NOTE — BH Assessment (Signed)
Assessment Note  Danny Grimes is an 82 y.o. male who presents to the ER due to leaving his [place] ([facility]-[phone]) and voicing SI. Patient states, the facility he lives in, keeps him locked in and are mean to him. On yesterday (06/23/2016), he told the Care Home Staff he wanted sit on the front porch. When he got outside, he kept walking, with the attempts of leaving the facility. He asked the neighbors to call 911 but they didn't because they knew him and history. He then sat in parked car near the home and wouldn't get out until they called 911. Patient states he was trying to get to the [facility]. During the interview, patient thought he was at the [place]. His son was supposed to take him on yesterday but it didn't happen.  Per the report of the patient's son Danny Grimes [id number]), the patient have history of doing things to get his way. He makes statements about others are being treated better than him. He also says he is sick and nothing's wrong with him. He's been known to use the condensation from a cup, wipe it on his face and say he sweating and having cold chills. He moved into the new facility May 12th, 2017. Prior to that, he was living in another home in [place]. He was there for 8  years. He had the same behaviors and complaints but they had more staff, to "cater and work with him."  Son also states, the patient has called him late at night, with complaints of medical needs, which have found to be erroneous and untrue.  During the interview, patient was calm, cooperative and pleasant. He was orient to; person, situation and date.     Diagnosis: Depression & Anxiety   Past Medical History:  Past Medical History  Diagnosis Date  . Adenocarcinoma ([place])   . Peripheral arterial disease ([place])   . Paroxysmal atrial fibrillation (HCC)   . Hemiparesis affecting left side as late effect of cerebrovascular accident (CVA) ([place])   .  Parkinson disease ([place])   . Hyperlipemia   . Hypertension   . Degenerative joint disease   . Depression   . Anxiety   . Fitting and adjustment of cardiac pacemaker   . CHF (congestive heart failure) ([place])   . DNR (do not resuscitate)   . Bilateral renal masses   . Colon polyp   . Skin cancer     Past Surgical History  Procedure Laterality Date  . Insert / replace/ remove peacemaker      Family History: History reviewed. No pertinent family history.  Social History:  reports that he has quit smoking. He does not have any smokeless tobacco history on file. He reports that he does not drink alcohol or use illicit drugs.  Additional Social History:  Alcohol / Drug Use Pain Medications: See PTA Prescriptions: See PTA Over the Counter: See PTA History of alcohol / drug use?: No history of alcohol / drug abuse Longest period of sobriety (when/how long): Reports of no current use Negative Consequences of Use:  (None) Withdrawal Symptoms:  (None)  CIWA: CIWA-Ar BP: (!) 144/76 mmHg Pulse Rate: 80 COWS:    Allergies:  Allergies  Allergen Reactions  . Bee Venom     Home Medications:  (Not in a hospital admission)  OB/GYN Status:  No LMP for male patient.  General Assessment Data Location of Assessment: [facility] ED TTS Assessment: In system Is this  a Tele or Face-to-Face Assessment?: Face-to-Face Is this an Initial Assessment or a Re-assessment for this encounter?: Initial Assessment Marital status: Widowed [place] name: n/a Is patient pregnant?: No Pregnancy Status: No Living Arrangements: Other (Comment) ([place] [phone])) Can pt return to current living arrangement?: Yes Admission Status: Voluntary Is patient capable of signing voluntary admission?: Yes Referral Source: Self/Family/Friend Insurance type: Medicare  Medical Screening Exam ([place]) Medical Exam completed: Yes  Crisis Care Plan Living Arrangements: Other  (Comment) ([place] [phone])) Legal Guardian: Other: (None) Name of Psychiatrist: [facility] Name of Therapist: [employer]  Education Status Is patient currently in school?: No Current Grade: n/a Highest grade of 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 : No Suicidal Intent: No Has patient had any suicidal intent within the past 6 months prior to admission? : No Is patient at risk for suicide?: No Suicidal Plan?: No Has patient had any suicidal plan within the past 6 months prior to admission? : No Access to Means: No What has been your use of drugs/alcohol within the last 12 months?: Reports of none Previous Attempts/Gestures: No How many times?: 0 Other Self Harm Risks: Reports of none Triggers for Past Attempts: None known Intentional Self Injurious Behavior: None Family Suicide History: No Recent stressful life event(s): Other (Comment) (Reports of none) Persecutory voices/beliefs?: No Depression: Yes Depression Symptoms: Feeling angry/irritable, Feeling worthless/self pity Substance abuse history and/or treatment for substance abuse?: No Suicide prevention information given to non-admitted patients: Not applicable  Risk to Others within the past 6 months Homicidal Ideation: No Does patient have any lifetime risk of violence toward others beyond the six months prior to admission? : No Thoughts of Harm to Others: No Current Homicidal Intent: No Current Homicidal Plan: No Access to Homicidal Means: No Identified Victim: Reports of none History of harm to others?: No Assessment of Violence: None Noted Violent Behavior Description: Reports of none Does patient have access to weapons?: No Criminal  Charges Pending?: No Describe Pending Criminal Charges: Reports of none Does patient have a court date: No Is patient on probation?: No  Psychosis Hallucinations: None noted Delusions: Somatic, Unspecified, Persecutory  Mental Status Report Appearance/Hygiene: Unremarkable, In scrubs, In hospital gown Eye Contact: Fair Motor Activity: Freedom of movement, Unremarkable Speech: Logical/coherent, Unremarkable Level of Consciousness: Alert Mood: Depressed, Anxious, Pleasant Affect: Appropriate to circumstance, Sad Anxiety Level: Minimal Thought Processes: Coherent, Relevant Judgement: Unimpaired Orientation: Person, Place, Time, Situation, Appropriate for developmental age Obsessive Compulsive Thoughts/Behaviors: Minimal  Cognitive Functioning Concentration: Normal Memory: Recent Intact, Remote Intact IQ: Average Insight: Fair Impulse Control: Poor Appetite: Fair Weight Loss: 0 Weight Gain: 0 Sleep: No Change Total Hours of Sleep: 7 Vegetative Symptoms: None  ADLScreening [facility] Assessment Services) Patient's cognitive ability adequate to safely complete daily activities?: Yes Patient able to express need for assistance with ADLs?: Yes Independently performs ADLs?: Yes (appropriate for developmental age)  Prior Inpatient Therapy Prior Inpatient Therapy: No Prior Therapy Dates: Reports of none Prior Therapy Facilty/Provider(s): Reports of none Reason for Treatment: Reports of none  Prior Outpatient Therapy Prior Outpatient Therapy: No Prior Therapy Dates: Current Prior Therapy Facilty/Provider(s): [facility] Reason for Treatment: Depression & Anxiety Does patient have an ACCT team?:  No Does patient have Intensive In-House Services?  : No Does patient have [facility] services? : No Does patient have P4CC services?: No  ADL Screening (condition at time of admission) Patient's cognitive ability adequate to safely complete daily activities?: Yes Is the  patient deaf or have difficulty hearing?: No Does the patient have difficulty seeing, even when wearing glasses/contacts?: No Does the patient have difficulty concentrating, remembering, or making decisions?: No Patient able to express need for assistance with ADLs?: Yes Does the patient have difficulty dressing or bathing?: No Independently performs ADLs?: Yes (appropriate for developmental age) Does the patient have difficulty walking or climbing stairs?: No Weakness of Legs: None Weakness of Arms/Hands: None  Home Assistive Devices/Equipment Home Assistive Devices/Equipment: None  Therapy Consults (therapy consults require a physician order) PT Evaluation Needed: No OT Evalulation Needed: No SLP Evaluation Needed: No Abuse/Neglect Assessment (Assessment to be complete while patient is alone) Physical Abuse: Denies Verbal Abuse: Denies Sexual Abuse: Denies Exploitation of patient/patient's resources: Denies Self-Neglect: Denies Values / Beliefs Cultural Requests During Hospitalization: None Spiritual Requests During Hospitalization: None Consults Spiritual Care Consult Needed: No Social Work Consult Needed: No Regulatory affairs officer (For Healthcare) Does patient have an advance directive?: No, Yes Type of Advance Directive: Press photographer    Additional Information 1:1 In Past 12 Months?: No CIRT Risk: No Elopement Risk: No Does patient have medical clearance?: Yes  Child/Adolescent Assessment Running Away Risk: Denies (Patient is an adult)  Disposition:  Disposition Initial Assessment Completed for this Encounter: Yes Disposition of Patient: Other dispositions (ER MD Ordered Psych Consult)  On Site Evaluation by:   Reviewed with Physician:    Gunnar Fusi MS, LCAS, LPC, [place], CCSI Therapeutic Triage Specialist 06/24/2016 3:21 PM

## 2016-06-24 NOTE — ED Notes (Signed)
BEHAVIORAL HEALTH ROUNDING Patient sleeping: Yes.   Patient alert and oriented: not applicable SLEEPING Behavior appropriate: Yes.  ; If no, describe: SLEEPING Nutrition and fluids offered: No SLEEPING Toileting and hygiene offered: NoSLEEPING Sitter present: not applicable, Q 15 min safety rounds and observation. Law enforcement present: Yes ODS 

## 2016-06-24 NOTE — ED Notes (Signed)
Patient says he woke up and can't go back to sleep.  He is asking for something to help him go back to sleep.  Asking MD to order him something.

## 2016-06-24 NOTE — Discharge Instructions (Signed)

## 2016-06-24 NOTE — ED Notes (Signed)
BEHAVIORAL HEALTH ROUNDING  Patient sleeping: No.  Patient alert and oriented: yes  Behavior appropriate: Yes. ; If no, describe:  Nutrition and fluids offered: Yes  Toileting and hygiene offered: Yes  Sitter present: not applicable, Q 15 min safety rounds and observation.  Law enforcement present: Yes ODS  

## 2016-06-24 NOTE — Consult Note (Signed)
[facility] Consult   Reason for Consult: dementia ---confusion Referring Physician:  ER Patient Identification: Danny Grimes MRN:  [id number] Principal Diagnosis: Dementia in Parkinson's disease [facility]) Diagnosis:   Patient Active Problem List   Diagnosis Date Noted  . 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 CHF (congestive heart failure) ([place]) [I50.9] 06/24/2016  . A-fib ([place]) [I48.91] 06/24/2016  . HTN (hypertension) [I10] 06/24/2016  . Hyperlipidemia [E78.5] 06/24/2016    Total Time spent with patient: 1 hour  Subjective:   Danny Grimes is a 82 y.o. male patient admitted with confusion.  HPI:   Danny Grimes is a 82 y.o. male who presented to the emergency department on 7/1 because he was found walking in the street. He states he was walking in the street because he wanted attention. He says that he does not like where he is living. His primary complaint to me was that they restrict his telephone use. He says that he walked in the street so he could be taken some are worse they would listen to him. He denies any thoughts of self-harm or suicidal ideation. Denies any thoughts of wanting hurt anyone else. Denies hallucinations.  Pt says he just moved to a new facility 6 weeks ago. They are rough with him and won't let him use the phone or to go outside.   Pt says he scape the facility as he wanted to let the police aware of his situation.  Pt was trying to get to the street and was planning on stopping a car.  Has been calm and cooperative in the ER.  Sons says he has been agitated lately.  He see a psychiatrist at the [place] and they have been adjusting hs meds slowly as there is concerns with falls.   Son reports pt is very somatic and frequently calls him demanding to be taken to the hospital for examination.  Substance abuse history: neg   Trauma h/o: unknown   Past Psychiatric History: carries a diagnosis of parkinson's. F/u at [facility] in  [place].  Prescribed with zyprexa 5 mg qhs and cymbalta 60 mg in addition to sinemet.  No past psychiatric admission or suicidal attempts.  Risk to Self: Is patient at risk for suicide?: No Risk to Others:  no  Past Medical History:  Past Medical History  Diagnosis Date  . Adenocarcinoma ([place])   . Peripheral arterial disease ([place])   . Paroxysmal atrial fibrillation (HCC)   . Hemiparesis affecting left side as late effect of cerebrovascular accident (CVA) ([place])   . Parkinson disease ([place])   . Hyperlipemia   . Hypertension   . Degenerative joint disease   . Depression   . Anxiety   . Fitting and adjustment of cardiac pacemaker   . CHF (congestive heart failure) ([place])   . DNR (do not resuscitate)   . Bilateral renal masses   . Colon polyp   . Skin cancer     Past Surgical History  Procedure Laterality Date  . Insert / replace/ remove peacemaker     Family History: History reviewed. No pertinent family history.  Family Psychiatric  History: uncle committed suicide  Social History: pt is a widow. Has one son who is supportive.  Was part of the army for 2 years.  Lives in a ALF. History  Alcohol Use No     History  Drug Use No    Social History   Social History  . Marital Status: Widowed  Spouse Name: N/A  . Number of Children: N/A  . Years of Education: N/A   Social History Main Topics  . Smoking status: Former Research scientist (life sciences)  . Smokeless tobacco: None  . Alcohol Use: No  . Drug Use: No  . Sexual Activity: Not Asked   Other Topics Concern  . None   Social History Narrative      Allergies:   Allergies  Allergen Reactions  . Bee Venom     Labs:  Results for orders placed or performed during the hospital encounter of 06/23/16 (from the past 48 hour(s))  Urinalysis complete, with microscopic ([facility] only)     Status: Abnormal   Collection Time: 06/23/16  2:15 PM  Result Value Ref Range   Color, Urine AMBER (A) YELLOW   APPearance HAZY (A) CLEAR   Glucose, UA  NEGATIVE NEGATIVE mg/dL   Bilirubin Urine NEGATIVE NEGATIVE   Ketones, ur TRACE (A) NEGATIVE mg/dL   Specific Gravity, Urine 1.025 1.005 - 1.030   Hgb urine dipstick NEGATIVE NEGATIVE   pH 5.0 5.0 - 8.0   Protein, ur 30 (A) NEGATIVE mg/dL   Nitrite NEGATIVE NEGATIVE   Leukocytes, UA NEGATIVE NEGATIVE   RBC / HPF 0-5 0 - 5 RBC/hpf   WBC, UA 0-5 0 - 5 WBC/hpf   Bacteria, UA RARE (A) NONE SEEN   Squamous Epithelial / LPF 0-5 (A) NONE SEEN   Mucous PRESENT    Hyaline Casts, UA PRESENT    Ca Oxalate Crys, UA PRESENT   Comprehensive metabolic panel     Status: Abnormal   Collection Time: 06/23/16  2:57 PM  Result Value Ref Range   Sodium 140 135 - 145 mmol/L   Potassium 4.4 3.5 - 5.1 mmol/L   Chloride 105 101 - 111 mmol/L   CO2 23 22 - 32 mmol/L   Glucose, Bld 128 (H) 65 - 99 mg/dL   BUN 31 (H) 6 - 20 mg/dL   Creatinine, Ser 2.00 (H) 0.61 - 1.24 mg/dL   Calcium 9.9 8.9 - 10.3 mg/dL   Total Protein 7.4 6.5 - 8.1 g/dL   Albumin 4.1 3.5 - 5.0 g/dL   AST 28 15 - 41 U/L   ALT 7 (L) 17 - 63 U/L   Alkaline Phosphatase 47 38 - 126 U/L   Total Bilirubin 1.4 (H) 0.3 - 1.2 mg/dL   GFR calc non Af Amer 29 (L) >60 mL/min   GFR calc Af Amer 33 (L) >60 mL/min    Comment: (NOTE) The eGFR has been calculated using the CKD EPI equation. This calculation has not been validated in all clinical situations. eGFR's persistently <60 mL/min signify possible Chronic Kidney Disease.    Anion gap 12 5 - 15  Ethanol     Status: None   Collection Time: 06/23/16  2:57 PM  Result Value Ref Range   Alcohol, Ethyl (B) <5 <5 mg/dL    Comment:        LOWEST DETECTABLE LIMIT FOR SERUM ALCOHOL IS 5 mg/dL FOR MEDICAL PURPOSES ONLY   CBC with Diff     Status: Abnormal   Collection Time: 06/23/16  2:57 PM  Result Value Ref Range   WBC 7.3 3.8 - 10.6 K/uL   RBC 4.45 4.40 - 5.90 MIL/uL   Hemoglobin 14.1 13.0 - 18.0 g/dL   HCT 42.0 40.0 - 52.0 %   MCV 94.4 80.0 - 100.0 fL   MCH 31.7 26.0 - 34.0 pg   MCHC  33.5 32.0 -  36.0 g/dL   RDW 15.2 (H) 11.5 - 14.5 %   Platelets 170 150 - 440 K/uL   Neutrophils Relative % 87 %   Neutro Abs 6.4 1.4 - 6.5 K/uL   Lymphocytes Relative 8 %   Lymphs Abs 0.5 (L) 1.0 - 3.6 K/uL   Monocytes Relative 5 %   Monocytes Absolute 0.4 0.2 - 1.0 K/uL   Eosinophils Relative 0 %   Eosinophils Absolute 0.0 0 - 0.7 K/uL   Basophils Relative 0 %   Basophils Absolute 0.0 0 - 0.1 K/uL  Urine Drug Screen, Qualitative ([facility] only)     Status: None   Collection Time: 06/23/16  3:05 PM  Result Value Ref Range   Tricyclic, Ur Screen NONE DETECTED NONE DETECTED   Amphetamines, Ur Screen NONE DETECTED NONE DETECTED   MDMA (Ecstasy)Ur Screen NONE DETECTED NONE DETECTED   Cocaine Metabolite,Ur [place] NONE DETECTED NONE DETECTED   Opiate, Ur Screen NONE DETECTED NONE DETECTED   Phencyclidine (PCP) Ur S NONE DETECTED NONE DETECTED   Cannabinoid 50 Ng, Ur [place] NONE DETECTED NONE DETECTED   Barbiturates, Ur Screen NONE DETECTED NONE DETECTED   Benzodiazepine, Ur Scrn NONE DETECTED NONE DETECTED   Methadone Scn, Ur NONE DETECTED NONE DETECTED    Comment: (NOTE) 865  Tricyclics, urine               Cutoff 1000 ng/mL 200  Amphetamines, urine             Cutoff 1000 ng/mL 300  MDMA (Ecstasy), urine           Cutoff 500 ng/mL 400  Cocaine Metabolite, urine       Cutoff 300 ng/mL 500  Opiate, urine                   Cutoff 300 ng/mL 600  Phencyclidine (PCP), urine      Cutoff 25 ng/mL 700  Cannabinoid, urine              Cutoff 50 ng/mL 800  Barbiturates, urine             Cutoff 200 ng/mL 900  Benzodiazepine, urine           Cutoff 200 ng/mL 1000 Methadone, urine                Cutoff 300 ng/mL 1100 1200 The urine drug screen provides only a preliminary, unconfirmed 1300 analytical test result and should not be used for non-medical 1400 purposes. Clinical consideration and professional judgment should 1500 be applied to any positive drug screen result due to possible 1600 interfering  substances. A more specific alternate chemical method 1700 must be used in order to obtain a confirmed analytical result.  1800 Gas chromato graphy / mass spectrometry (GC/MS) is the preferred 1900 confirmatory method.     No current facility-administered medications for this encounter.   Current Outpatient Prescriptions  Medication Sig Dispense Refill  . alfuzosin (UROXATRAL) 10 MG 24 hr tablet Take 10 mg by mouth daily with breakfast.    . aspirin 81 MG tablet Take 81 mg by mouth daily.    Marland Kitchen atorvastatin (LIPITOR) 20 MG tablet Take 20 mg by mouth daily.    . calcium carbonate (OSCAL) 1500 (600 Ca) MG TABS tablet Take by mouth 2 (two) times daily with a meal.    . carbidopa-levodopa (SINEMET IR) 25-100 MG tablet Take by mouth 3 (three) times daily.    . Cholecalciferol 10000 units  TABS Take by mouth.    . DULoxetine (CYMBALTA) 60 MG capsule Take 60 mg by mouth daily.    Marland Kitchen dutasteride (AVODART) 0.5 MG capsule Take 0.5 mg by mouth daily.    . fluticasone (FLONASE) 50 MCG/ACT nasal spray Place into both nostrils daily.    Marland Kitchen gabapentin (NEURONTIN) 600 MG tablet Take 600 mg by mouth 3 (three) times daily.    Marland Kitchen lisinopril (PRINIVIL,ZESTRIL) 2.5 MG tablet Take 2.5 mg by mouth daily.    . metoprolol succinate (TOPROL-XL) 25 MG 24 hr tablet Take 25 mg by mouth daily.    . Multiple Vitamin (MULTIVITAMIN) capsule Take 1 capsule by mouth daily.    Marland Kitchen nystatin [facility]) powder Apply topically 4 (four) times daily.    Marland Kitchen OLANZapine (ZYPREXA) 5 MG tablet Take by mouth at bedtime.    . phenazopyridine (PYRIDIUM) 200 MG tablet Take 1 tablet (200 mg total) by mouth 3 (three) times daily. 6 tablet 0  . polyethylene glycol (MIRALAX / GLYCOLAX) packet Take 17 g by mouth daily.    . sennosides-docusate sodium (SENOKOT-S) 8.6-50 MG tablet Take 1 tablet by mouth daily.    . tamsulosin (FLOMAX) 0.4 MG CAPS capsule Take 1 capsule (0.4 mg total) by mouth every morning. 30 capsule 0    Musculoskeletal: Strength  & Muscle Tone: within normal limits Gait & Station: normal Patient leans: N/A  Psychiatric Specialty Exam: Physical Exam  Constitutional: He is oriented to person, place, and time. He appears well-developed and well-nourished.  HENT:  Head: Normocephalic and atraumatic.  Eyes: EOM are normal.  Neck: Normal range of motion.  Respiratory: Effort normal.  Musculoskeletal: Normal range of motion.  Neurological: He is alert and oriented to person, place, and time.  Skin: Skin is dry.    Review of Systems  Constitutional: Negative.   HENT: Negative.   Eyes: Negative.   Respiratory: Negative.   Cardiovascular: Negative.   Gastrointestinal: Negative.   Genitourinary: Positive for frequency.  Musculoskeletal: Negative.   Skin: Negative.   Neurological: Negative.   Endo/Heme/Allergies: Negative.   Psychiatric/Behavioral: Positive for memory loss.    Blood pressure 144/76, pulse 80, temperature 97.4 F (36.3 C), temperature source Oral, resp. rate 20, height 5' 11" (1.803 m), weight 87.998 kg (194 lb), SpO2 100 %.Body mass index is 27.07 kg/(m^2).  General Appearance: Fairly Groomed  Eye Contact:  Good  Speech:  Garbled  Volume:  Normal  Mood:  Anxious  Affect:  Congruent  Thought Process:  Irrelevant and Descriptions of Associations: Intact  Orientation:  Full (Time, Place, and Person)  Thought Content:  Paranoid Ideation  Suicidal Thoughts:  No  Homicidal Thoughts:  No  Memory:  Immediate;   Poor Recent;   Poor Remote;   Poor  Judgement:  Poor  Insight:  Lacking  Psychomotor Activity:  Decreased  Concentration:  Concentration: Fair and Attention Span: Fair  Recall:  Poor  Fund of Knowledge:  Fair  Language:  Fair  Akathisia:  No  Handed:    AIMS (if indicated):     Assets:  Agricultural consultant Housing Social Support  ADL's:  Intact  Cognition:  WNL  Sleep:      Treatment Plan Summary:  Will recommend to increase zyprexa to 2.5 mg  po q day and continue 5 mg po qhs--- as pt appears somewhat suspicious  and paranoid.  Depression: continue cymbalta 60 mg po q day  Pt to continue f/u with [facility] psychiatry.  Will d/c back  to ALF.  Spoke with pt's son and discussed case with ER MD   Disposition: No evidence of imminent risk to self or others at present.   Patient does not meet criteria for psychiatric inpatient admission.  Hildred Priest, MD 06/24/2016 11:20 AM

## 2016-06-24 NOTE — ED Notes (Signed)
Patients son [name] called for update on patient, [name] informed that we are currently waiting for psych to see patient and then will have more of an update on him. [name] will call back later.

## 2016-06-24 NOTE — ED Notes (Addendum)
Pt placed into his clothes - his shirt had old urine on it so pt asked that i throw it out. He will wear the scrub shirt home. Pt has parkinson and unable to sign discharge. Papers will be given to son when he arrives. Waiting for meds to arrive from pharmacy

## 2016-06-24 NOTE — ED Provider Notes (Signed)
Case discussed with Dr. Jerilee Hoh of psychiatry after her evaluation. She has recommended to increase the Zyprexa to twice daily with a half dose in the morning. Continue all other medications. Patient will follow up closely with his psychiatrist at the [place] this week. Case was discussed with the patient's son who agrees. Patient's creatinine was elevated on initial labs, and on recheck today it is improved back to his approximately baseline. This appears to be stable CKD.  Carrie Mew, MD 06/24/16 [phone]

## 2016-07-22 ENCOUNTER — Emergency Department: Payer: Medicare Other

## 2016-07-22 ENCOUNTER — Emergency Department
Admission: EM | Admit: 2016-07-22 | Discharge: 2016-07-22 | Disposition: A | Discharge status: Home / Self Care | Payer: Medicare Other | Attending: Emergency Medicine | Admitting: Emergency Medicine

## 2016-07-22 DIAGNOSIS — Z7951 Long term (current) use of inhaled steroids: Secondary | ICD-10-CM | POA: Diagnosis not present

## 2016-07-22 DIAGNOSIS — I110 Hypertensive heart disease with heart failure: Secondary | ICD-10-CM | POA: Insufficient documentation

## 2016-07-22 DIAGNOSIS — Y929 Unspecified place or not applicable: Secondary | ICD-10-CM | POA: Insufficient documentation

## 2016-07-22 DIAGNOSIS — Z7401 Bed confinement status: Secondary | ICD-10-CM | POA: Diagnosis not present

## 2016-07-22 DIAGNOSIS — Z87891 Personal history of nicotine dependence: Secondary | ICD-10-CM | POA: Diagnosis not present

## 2016-07-22 DIAGNOSIS — Y999 Unspecified external cause status: Secondary | ICD-10-CM | POA: Diagnosis not present

## 2016-07-22 DIAGNOSIS — Y939 Activity, unspecified: Secondary | ICD-10-CM | POA: Insufficient documentation

## 2016-07-22 DIAGNOSIS — I509 Heart failure, unspecified: Secondary | ICD-10-CM | POA: Insufficient documentation

## 2016-07-22 DIAGNOSIS — Z85828 Personal history of other malignant neoplasm of skin: Secondary | ICD-10-CM | POA: Insufficient documentation

## 2016-07-22 DIAGNOSIS — Z7982 Long term (current) use of aspirin: Secondary | ICD-10-CM | POA: Diagnosis not present

## 2016-07-22 DIAGNOSIS — S199XXA Unspecified injury of neck, initial encounter: Secondary | ICD-10-CM | POA: Diagnosis not present

## 2016-07-22 DIAGNOSIS — S0990XA Unspecified injury of head, initial encounter: Secondary | ICD-10-CM | POA: Diagnosis present

## 2016-07-22 DIAGNOSIS — W19XXXA Unspecified fall, initial encounter: Secondary | ICD-10-CM | POA: Insufficient documentation

## 2016-07-22 DIAGNOSIS — S0101XA Laceration without foreign body of scalp, initial encounter: Secondary | ICD-10-CM | POA: Diagnosis not present

## 2016-07-22 DIAGNOSIS — R6889 Other general symptoms and signs: Secondary | ICD-10-CM | POA: Diagnosis not present

## 2016-07-22 LAB — PROTIME-INR
INR: 0.99
Prothrombin Time: 13.1 seconds (ref 11.4–15.2)

## 2016-07-22 LAB — BASIC METABOLIC PANEL
Anion gap: 11 (ref 5–15)
BUN: 43 mg/dL — AB (ref 6–20)
CO2: 25 mmol/L (ref 22–32)
Calcium: 9.4 mg/dL (ref 8.9–10.3)
Chloride: 102 mmol/L (ref 101–111)
Creatinine, Ser: 1.92 mg/dL — ABNORMAL HIGH (ref 0.61–1.24)
GFR calc Af Amer: 35 mL/min — ABNORMAL LOW (ref >60)
GFR, EST NON AFRICAN AMERICAN: 30 mL/min — AB (ref >60)
GLUCOSE: 167 mg/dL — AB (ref 65–99)
POTASSIUM: 4.9 mmol/L (ref 3.5–5.1)
Sodium: 138 mmol/L (ref 135–145)

## 2016-07-22 LAB — CBC
HEMATOCRIT: 42.4 % (ref 40.0–52.0)
Hemoglobin: 14.6 g/dL (ref 13.0–18.0)
MCH: 32.1 pg (ref 26.0–34.0)
MCHC: 34.5 g/dL (ref 32.0–36.0)
MCV: 93.1 fL (ref 80.0–100.0)
PLATELETS: 147 10*3/uL — AB (ref 150–440)
RBC: 4.56 MIL/uL (ref 4.40–5.90)
RDW: 14.3 % (ref 11.5–14.5)
WBC: 8.3 10*3/uL (ref 3.8–10.6)

## 2016-07-22 MED ORDER — LIDOCAINE-EPINEPHRINE (PF) 1 %-1:200000 IJ SOLN
INTRAMUSCULAR | Status: AC
  Administered 2016-07-22: 10:00:00
  Filled 2016-07-22: qty 30

## 2016-07-22 NOTE — ED Provider Notes (Signed)
[facility] Emergency Department Provider Note  Time seen: 8:53 AM  I have reviewed the triage vital signs and the nursing notes.   HISTORY  Chief Complaint Fall    HPI Danny Grimes is a 82 y.o. male with a past medical history CHF, depression, hypertension, hyperlipidemia, atrial fibrillation on Eliquis, who presents to the emergency department after a fall. Per EMS report the patient had a non-witnessed fall this morning. Patient noted to be on the ground, with a 1 inch laceration to his occipital scalp so they sent him to the emergency department for evaluation. Patient does not recall falling. Does not know why he is in the emergency department. Patient has a history of dementia. Very pleasant, denies any complaints at this time.  Past Medical History:  Diagnosis Date  . Adenocarcinoma ([place])   . Anxiety   . Bilateral renal masses   . CHF (congestive heart failure) ([place])   . Colon polyp   . Degenerative joint disease   . Depression   . DNR (do not resuscitate)   . Fitting and adjustment of cardiac pacemaker   . Hemiparesis affecting left side as late effect of cerebrovascular accident (CVA) ([place])   . Hyperlipemia   . Hypertension   . Parkinson disease ([place])   . Paroxysmal atrial fibrillation (HCC)   . Peripheral arterial disease ([place])   . Skin cancer     Patient Active Problem List   Diagnosis Date Noted  . 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 CHF (congestive heart failure) ([place]) 06/24/2016  . A-fib ([place]) 06/24/2016  . HTN (hypertension) 06/24/2016  . Hyperlipidemia 06/24/2016    Past Surgical History:  Procedure Laterality Date  . insert / replace/ remove peacemaker      Prior to Admission medications   Medication Sig Start Date End Date Taking? Authorizing Provider  alfuzosin (UROXATRAL) 10 MG 24 hr tablet Take 10 mg by mouth daily with breakfast.    Historical Provider, MD  aspirin 81 MG tablet Take 81 mg by mouth daily.     Historical Provider, MD  atorvastatin (LIPITOR) 20 MG tablet Take 20 mg by mouth daily.    Historical Provider, MD  calcium carbonate (OSCAL) 1500 (600 Ca) MG TABS tablet Take by mouth 2 (two) times daily with a meal.    Historical Provider, MD  carbidopa-levodopa (SINEMET IR) 25-100 MG tablet Take by mouth 3 (three) times daily.    Historical Provider, MD  Cholecalciferol 10000 units TABS Take by mouth.    Historical Provider, MD  DULoxetine (CYMBALTA) 60 MG capsule Take 60 mg by mouth daily.    Historical Provider, MD  dutasteride (AVODART) 0.5 MG capsule Take 0.5 mg by mouth daily.    Historical Provider, MD  fluticasone (FLONASE) 50 MCG/ACT nasal spray Place into both nostrils daily.    Historical Provider, MD  gabapentin (NEURONTIN) 600 MG tablet Take 600 mg by mouth 3 (three) times daily.    Historical Provider, MD  lisinopril (PRINIVIL,ZESTRIL) 2.5 MG tablet Take 2.5 mg by mouth daily.    Historical Provider, MD  metoprolol succinate (TOPROL-XL) 25 MG 24 hr tablet Take 25 mg by mouth daily.    Historical Provider, MD  Multiple Vitamin (MULTIVITAMIN) capsule Take 1 capsule by mouth daily.    Historical Provider, MD  nystatin [facility]) powder Apply topically 4 (four) times daily.    Historical Provider, MD  OLANZapine (ZYPREXA) 5 MG tablet Take 2.5 mg (half tablet) each morning and 5 mg (  1 tablet) each night at bedtime. 06/24/16   Carrie Mew, MD  phenazopyridine (PYRIDIUM) 200 MG tablet Take 1 tablet (200 mg total) by mouth 3 (three) times daily. 05/29/16   Lorin Picket, PA-C  polyethylene glycol (MIRALAX / GLYCOLAX) packet Take 17 g by mouth daily.    Historical Provider, MD  sennosides-docusate sodium (SENOKOT-S) 8.6-50 MG tablet Take 1 tablet by mouth daily.    Historical Provider, MD  tamsulosin (FLOMAX) 0.4 MG CAPS capsule Take 1 capsule (0.4 mg total) by mouth every morning. 05/29/16   Lorin Picket, PA-C    Allergies  Allergen Reactions  . Bee Venom     No family history  on file.  Social History Social History  Substance Use Topics  . Smoking status: Former Research scientist (life sciences)  . Smokeless tobacco: Not on file  . Alcohol use No    Review of Systems Constitutional: Negative for fever. Cardiovascular: Negative for chest pain. Respiratory: Negative for shortness of breath. Gastrointestinal: Negative for abdominal pain Musculoskeletal: Negative for back pain.Negative for neck pain. Skin: One-inch laceration to occipital scalp Neurological: Negative for headache 10-point ROS otherwise negative.  ____________________________________________   PHYSICAL EXAM:  VITAL SIGNS: ED Triage Vitals [07/22/16 0845]  Enc Vitals Group     BP 138/73     Pulse Rate 84     Resp 20     Temp 98.2 F (36.8 C)     Temp Source Oral     SpO2 97 %     Weight 170 lb (77.1 kg)     Height 5\' 11"  (1.803 m)     Head Circumference      Peak Flow      Pain Score      Pain Loc      Pain Edu?      Excl. in [place]?     Constitutional: Alert. Well appearing and in no distress. Eyes: Normal exam ENT   Head: 3 cm laceration to occipital scalp. No hematoma.   Mouth/Throat: Mucous membranes are moist. Cardiovascular: Normal rate, regular rhythm. No murmur Respiratory: Normal respiratory effort without tachypnea nor retractions. Breath sounds are clear Gastrointestinal: Soft and nontender. No distention.  Musculoskeletal: Nontender with normal range of motion in all extremities. No C-spine tenderness. Neurologic:  Normal speech and language. No gross focal neurologic deficits are appreciated. Skin:  Skin is warm, dry and intact.  Psychiatric: Mood and affect are normal. Speech and behavior are normal.   ____________________________________________    EKG  EKG reviewed and interpreted by myself shows an atrial ventricular dual paced rhythm at 89 bpm.  ____________________________________________    RADIOLOGY  CT head and C-spine show no acute  abnormality  ____________________________________________   INITIAL IMPRESSION / ASSESSMENT AND PLAN / ED COURSE  Pertinent labs & imaging results that were available during my care of the patient were reviewed by me and considered in my medical decision making (see chart for details).  The patient presents the emergency department after an unwitnessed fall. Patient does have a 1 inch laceration to his occipital scalp with no surrounding hematoma noted. Patient is on Eliquis for paroxysmal atrial fibrillation, we'll proceed with a CT of the head to rule out intracranial abnormality. We will obtain basic labs as well as an EKG as the fall was unwitnessed. Overall the patient appears well, no distress, no complaints at this time.  CT head and C-spine show no acute abnormality. EKG is a paced rhythm. Labs are largely within normal limits. Patient's  laceration has been repaired with 3 staples which will need to be removed in approximately 10 days.  LACERATION REPAIR Performed by: Harvest Dark Authorized by: Harvest Dark Consent: Verbal consent obtained. Risks and benefits: risks, benefits and alternatives were discussed Consent given by: patient Patient identity confirmed: provided demographic data Prepped and Draped in normal sterile fashion Wound explored  Laceration Location: Occipital scalp  Laceration Length: 3 cm  No Foreign Bodies seen or palpated  Anesthesia: local infiltration  Local anesthetic: lidocaine 1 % with epinephrine  Anesthetic total: 4 ml  Irrigation method: syringe Amount of cleaning: standard  Skin closure: Staples   Number of staples: 3    Patient tolerance: Patient tolerated the procedure well with no immediate complications.   ____________________________________________   FINAL CLINICAL IMPRESSION(S) / ED DIAGNOSES  Fall Laceration   Harvest Dark, MD 07/22/16 1012

## 2016-07-22 NOTE — ED Notes (Signed)
Patient transported to CT 

## 2016-07-22 NOTE — ED Triage Notes (Signed)
Pt presents from [facility] after a non-witnessed fall this morning.  Has about a 1" laceration to the back of head.  Pt denies memory of any events leading to the fall and doesn't remember falling.  Pt is on Apixiban.  Denies pain anywhere.  Neuro check [facility].

## 2016-07-22 NOTE — Discharge Instructions (Signed)
You have been seen in the emergency department today following a fall. Her workup including CT imaging and labs is largely normal. You have suffered a 3 cm laceration to the back of her scalp this has been repaired with staples. Please keep the area clean. Please follow-up with your doctor in approximately 10 days for staple removal.

## 2016-07-22 NOTE — ED Notes (Signed)
Bed alarm placed and mats placed on floor at bedside.

## 2016-07-22 NOTE — ED Notes (Signed)
Pt assisted to stand beside bed and attempted to use urinal.  Was unable to urinate at this time.  Assisted back to bed.

## 2016-09-28 DIAGNOSIS — Z23 Encounter for immunization: Secondary | ICD-10-CM | POA: Diagnosis not present

## 2016-10-20 ENCOUNTER — Emergency Department: Payer: Medicare Other

## 2016-10-20 ENCOUNTER — Emergency Department
Admission: EM | Admit: 2016-10-20 | Discharge: 2016-10-20 | Disposition: A | Discharge status: Home / Self Care | Payer: Medicare Other | Attending: Emergency Medicine | Admitting: Emergency Medicine

## 2016-10-20 ENCOUNTER — Encounter: Payer: Self-pay | Admitting: Emergency Medicine

## 2016-10-20 DIAGNOSIS — G20 Parkinson's disease: Secondary | ICD-10-CM | POA: Diagnosis not present

## 2016-10-20 DIAGNOSIS — Z85828 Personal history of other malignant neoplasm of skin: Secondary | ICD-10-CM | POA: Diagnosis not present

## 2016-10-20 DIAGNOSIS — Z87891 Personal history of nicotine dependence: Secondary | ICD-10-CM | POA: Insufficient documentation

## 2016-10-20 DIAGNOSIS — Z7982 Long term (current) use of aspirin: Secondary | ICD-10-CM | POA: Insufficient documentation

## 2016-10-20 DIAGNOSIS — I110 Hypertensive heart disease with heart failure: Secondary | ICD-10-CM | POA: Insufficient documentation

## 2016-10-20 DIAGNOSIS — Z79899 Other long term (current) drug therapy: Secondary | ICD-10-CM | POA: Diagnosis not present

## 2016-10-20 DIAGNOSIS — I509 Heart failure, unspecified: Secondary | ICD-10-CM | POA: Insufficient documentation

## 2016-10-20 DIAGNOSIS — R451 Restlessness and agitation: Secondary | ICD-10-CM | POA: Diagnosis not present

## 2016-10-20 DIAGNOSIS — F99 Mental disorder, not otherwise specified: Secondary | ICD-10-CM | POA: Diagnosis not present

## 2016-10-20 DIAGNOSIS — R6889 Other general symptoms and signs: Secondary | ICD-10-CM | POA: Diagnosis not present

## 2016-10-20 DIAGNOSIS — R4182 Altered mental status, unspecified: Secondary | ICD-10-CM | POA: Diagnosis not present

## 2016-10-20 DIAGNOSIS — Z743 Need for continuous supervision: Secondary | ICD-10-CM | POA: Diagnosis not present

## 2016-10-20 LAB — CBC WITH DIFFERENTIAL/PLATELET
BASOS PCT: 1 %
Basophils Absolute: 0 10*3/uL (ref 0–0.1)
EOS ABS: 0 10*3/uL (ref 0–0.7)
EOS PCT: 1 %
HCT: 42.6 % (ref 40.0–52.0)
Hemoglobin: 14.1 g/dL (ref 13.0–18.0)
LYMPHS ABS: 1.3 10*3/uL (ref 1.0–3.6)
Lymphocytes Relative: 20 %
MCH: 30.4 pg (ref 26.0–34.0)
MCHC: 33.1 g/dL (ref 32.0–36.0)
MCV: 91.7 fL (ref 80.0–100.0)
MONOS PCT: 8 %
Monocytes Absolute: 0.5 10*3/uL (ref 0.2–1.0)
NEUTROS PCT: 70 %
Neutro Abs: 4.6 10*3/uL (ref 1.4–6.5)
PLATELETS: 149 10*3/uL — AB (ref 150–440)
RBC: 4.65 MIL/uL (ref 4.40–5.90)
RDW: 14.3 % (ref 11.5–14.5)
WBC: 6.6 10*3/uL (ref 3.8–10.6)

## 2016-10-20 LAB — URINALYSIS COMPLETE WITH MICROSCOPIC (ARMC ONLY)
BILIRUBIN URINE: NEGATIVE
GLUCOSE, UA: NEGATIVE mg/dL
Hgb urine dipstick: NEGATIVE
Leukocytes, UA: NEGATIVE
Nitrite: NEGATIVE
PH: 6 (ref 5.0–8.0)
Protein, ur: 100 mg/dL — AB
SQUAMOUS EPITHELIAL / LPF: NONE SEEN
Specific Gravity, Urine: 1.02 (ref 1.005–1.030)

## 2016-10-20 LAB — BASIC METABOLIC PANEL
Anion gap: 8 (ref 5–15)
BUN: 23 mg/dL — AB (ref 6–20)
CALCIUM: 9.5 mg/dL (ref 8.9–10.3)
CO2: 29 mmol/L (ref 22–32)
CREATININE: 1.12 mg/dL (ref 0.61–1.24)
Chloride: 105 mmol/L (ref 101–111)
GFR, EST NON AFRICAN AMERICAN: 57 mL/min — AB (ref >60)
Glucose, Bld: 120 mg/dL — ABNORMAL HIGH (ref 65–99)
Potassium: 3.6 mmol/L (ref 3.5–5.1)
SODIUM: 142 mmol/L (ref 135–145)

## 2016-10-20 LAB — TROPONIN I
TROPONIN I: 0.06 ng/mL — AB (ref <0.03)
Troponin I: 0.06 ng/mL (ref <0.03)

## 2016-10-20 LAB — GLUCOSE, CAPILLARY: GLUCOSE-CAPILLARY: 120 mg/dL — AB (ref 65–99)

## 2016-10-20 MED ORDER — ACETAMINOPHEN 500 MG PO TABS
1000.0000 mg | ORAL_TABLET | Freq: Once | ORAL | Status: AC
  Administered 2016-10-20: 1000 mg via ORAL
  Filled 2016-10-20: qty 2

## 2016-10-20 NOTE — ED Notes (Signed)
Spoke with Kennyth Lose at [facility] and gave report on patient

## 2016-10-20 NOTE — ED Notes (Signed)
EMS called stating they had taken patient back to [facility], where it was documented patient came from, [facility] did not have record of patient being at that facility.   RN looked at patients paperwork from facility and patient was sent from [facility].   EMS informed

## 2016-10-20 NOTE — ED Provider Notes (Addendum)
-----------------------------------------   8:10 AM on 10/20/2016 -----------------------------------------  Danny Grimes is here with a history of dementia. Signed out to me at this time. Patient here because of increased agitation at the nursing home. Patient is otherwise stable at this time Blood work is pending, initial troponin is borderline. Dr. Archie Balboa reports the patient has a elevated troponin 0.06. Patient has no points of chest pain but again has a history of dementia. Dr. Archie Balboa did discuss with the hospitalist and would like repeat troponin. Blood work is pending. CT head is pending. Chest x-ray is pending.   Schuyler Amor, MD 10/20/16 [place], MD 10/20/16 [phone]

## 2016-10-20 NOTE — ED Provider Notes (Signed)
[facility] Emergency Department Provider Note   ____________________________________________   I have reviewed the triage vital signs and the nursing notes.   HISTORY  Chief Complaint Agitation   History limited by: Dementia   HPI Danny Grimes is a 82 y.o. male who presents from living facility today because of concern for aggressive and abnormal behavior. Per report the patient was swinging his cane at staff. They state that this is quite unlike him. The patient himself cannot give a good history of the events, when asked why he was here he simply stated that he could not sleep well for the past week.Patient denies any pain.    Past Medical History:  Diagnosis Date  . Adenocarcinoma ([place])   . Anxiety   . Bilateral renal masses   . CHF (congestive heart failure) ([place])   . Colon polyp   . Degenerative joint disease   . Depression   . DNR (do not resuscitate)   . Fitting and adjustment of cardiac pacemaker   . Hemiparesis affecting left side as late effect of cerebrovascular accident (CVA) ([place])   . Hyperlipemia   . Hypertension   . Parkinson disease ([place])   . Paroxysmal atrial fibrillation (HCC)   . Peripheral arterial disease ([place])   . Skin cancer     Patient Active Problem List   Diagnosis Date Noted  . 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 CHF (congestive heart failure) ([place]) 06/24/2016  . A-fib ([place]) 06/24/2016  . HTN (hypertension) 06/24/2016  . Hyperlipidemia 06/24/2016    Past Surgical History:  Procedure Laterality Date  . insert / replace/ remove peacemaker      Prior to Admission medications   Medication Sig Start Date End Date Taking? Authorizing Provider  alfuzosin (UROXATRAL) 10 MG 24 hr tablet Take 10 mg by mouth daily with breakfast.    Historical Provider, MD  aspirin 81 MG tablet Take 81 mg by mouth daily.    Historical Provider, MD  atorvastatin (LIPITOR) 20 MG tablet Take 20 mg by mouth daily.     Historical Provider, MD  calcium carbonate (OSCAL) 1500 (600 Ca) MG TABS tablet Take by mouth 2 (two) times daily with a meal.    Historical Provider, MD  carbidopa-levodopa (SINEMET IR) 25-100 MG tablet Take by mouth 3 (three) times daily.    Historical Provider, MD  Cholecalciferol 10000 units TABS Take by mouth.    Historical Provider, MD  DULoxetine (CYMBALTA) 60 MG capsule Take 60 mg by mouth daily.    Historical Provider, MD  dutasteride (AVODART) 0.5 MG capsule Take 0.5 mg by mouth daily.    Historical Provider, MD  fluticasone (FLONASE) 50 MCG/ACT nasal spray Place into both nostrils daily.    Historical Provider, MD  gabapentin (NEURONTIN) 600 MG tablet Take 600 mg by mouth 3 (three) times daily.    Historical Provider, MD  lisinopril (PRINIVIL,ZESTRIL) 2.5 MG tablet Take 2.5 mg by mouth daily.    Historical Provider, MD  metoprolol succinate (TOPROL-XL) 25 MG 24 hr tablet Take 25 mg by mouth daily.    Historical Provider, MD  Multiple Vitamin (MULTIVITAMIN) capsule Take 1 capsule by mouth daily.    Historical Provider, MD  nystatin [facility]) powder Apply topically 4 (four) times daily.    Historical Provider, MD  OLANZapine (ZYPREXA) 5 MG tablet Take 2.5 mg (half tablet) each morning and 5 mg (1 tablet) each night at bedtime. 06/24/16   Carrie Mew, MD  phenazopyridine (PYRIDIUM) 200 MG  tablet Take 1 tablet (200 mg total) by mouth 3 (three) times daily. 05/29/16   Lorin Picket, PA-C  polyethylene glycol (MIRALAX / GLYCOLAX) packet Take 17 g by mouth daily.    Historical Provider, MD  sennosides-docusate sodium (SENOKOT-S) 8.6-50 MG tablet Take 1 tablet by mouth daily.    Historical Provider, MD  tamsulosin (FLOMAX) 0.4 MG CAPS capsule Take 1 capsule (0.4 mg total) by mouth every morning. 05/29/16   Lorin Picket, PA-C    Allergies Bee venom  No family history on file.  Social History Social History  Substance Use Topics  . Smoking status: Former Research scientist (life sciences)  . Smokeless tobacco:  Not on file  . Alcohol use No    Review of Systems  Constitutional: Negative for fever. Cardiovascular: Negative for chest pain. Respiratory: Negative for shortness of breath. Gastrointestinal: Negative for abdominal pain, vomiting and diarrhea. Neurological: Negative for headaches, focal weakness or numbness.   10-point ROS otherwise negative.  ____________________________________________   PHYSICAL EXAM:  VITAL SIGNS: ED Triage Vitals  Enc Vitals Group     BP 10/20/16 0452 (!) 156/111     Pulse Rate 10/20/16 0452 75     Resp 10/20/16 0452 (!) 21     Temp 10/20/16 0452 98 F (36.7 C)     Temp Source 10/20/16 0452 Oral     SpO2 10/20/16 0452 99 %     Weight 10/20/16 0457 165 lb (74.8 kg)     Height 10/20/16 0457 5\' 11"  (1.803 m)     Head Circumference --      Peak Flow --      Pain Score 10/20/16 0458 8     Pain Loc --      Pain Edu? --      Excl. in [place]? --      Constitutional: Alert and oriented. Well appearing and in no distress. Eyes: Conjunctivae are normal. Normal extraocular movements. ENT   Head: Normocephalic and atraumatic.   Nose: No congestion/rhinnorhea.   Mouth/Throat: Mucous membranes are moist.   Neck: No stridor. Hematological/Lymphatic/Immunilogical: No cervical lymphadenopathy. Cardiovascular: Normal rate, regular rhythm.  No murmurs, rubs, or gallops.  Respiratory: Normal respiratory effort without tachypnea nor retractions. Breath sounds are clear and equal bilaterally. No wheezes/rales/rhonchi. Gastrointestinal: Soft and nontender. No distention.  Genitourinary: Deferred Musculoskeletal: Normal range of motion in all extremities. No lower extremity edema. Neurologic:  Normal speech and language. No gross focal neurologic deficits are appreciated.  Skin:  Skin is warm, dry and intact. No rash noted. Psychiatric: Mood and affect are normal. Speech and behavior are normal. Patient exhibits appropriate insight and  judgment.  ____________________________________________    LABS (pertinent positives/negatives)  Trop 0.06  ____________________________________________   EKG  I, Nance Pear, attending physician, personally viewed and interpreted this EKG  EKG Time: 0454 Rate: 86 Rhythm: av dual paced rhythm Axis: left axis deviation Intervals: qtc 511 QRS: wide ST changes: no st elevation equivalent Impression: abnormal ekg  ____________________________________________    RADIOLOGY  CT head pending  ____________________________________________   PROCEDURES  Procedures  ____________________________________________   INITIAL IMPRESSION / ASSESSMENT AND PLAN / ED COURSE  Pertinent labs & imaging results that were available during my care of the patient were reviewed by me and considered in my medical decision making (see chart for details).  Patient from living facility with agitation and abnormal behavior. On exam here patient demented but not aggressive. Blood work initially concerning for troponin of 0.06. Will recheck. Awaiting UA and  head CT at time of sign out.   ____________________________________________   FINAL CLINICAL IMPRESSION(S) / ED DIAGNOSES  Abnormal behavior  Note: This dictation was prepared with Dragon dictation. Any transcriptional errors that result from this process are unintentional    Nance Pear, MD 10/20/16 2300

## 2016-10-20 NOTE — ED Notes (Signed)
Attempted to call [facility] x 1, no answer

## 2016-10-20 NOTE — ED Notes (Signed)
Patient given meal tray.

## 2016-10-20 NOTE — ED Notes (Signed)
EMS called to transport patient back to [facility]

## 2016-10-20 NOTE — ED Notes (Signed)
Patients Son, Danny Grimes called, patient gave RN permission to speak with Son. Per Son patient has seemed weaker than normal and states that patient has told his son that he does not "feel himself." Patient son states that patient was admitted to [facility] hospital recently for elevated cardiac enzymes and had a heart cath done. Dr. Burlene Arnt informed of this.  Patient son spoke with Dr. Burlene Arnt and the 2 are in agreement to d/c patient back to [facility].

## 2016-10-20 NOTE — ED Triage Notes (Addendum)
Pt arrived via ems from [facility]. [facility] RN reports to RN that pt was combative today by swinging a cane at her which is altered from baseline. Pt reported to ems that "he had a bad day." Pt reports today we was very agitated by the staff but does not want the details documented. Pt is alert , oriented, calm and cooperative upon assessment.

## 2016-10-20 NOTE — ED Notes (Signed)
EMS given bedside report on patient

## 2016-10-20 NOTE — ED Provider Notes (Signed)
Patient remains asymptomatic here with no complaints and specifically no complaints of chest pain. He does have a borderline troponin. Patient is DNR/DNI. We discussed with the hospitalist service that would prefer not to admit him as he has no symptoms. I discussed with his son. [facility]. If he did need to be admitted he would have to be admitted to the [place]. He and I discussed at length, the risks benefits and alternative of admission for this. The troponin certainly is not trending up. EKG is a paced rhythm. Patient apparently had a heart catheterization from the [place] in March which showed no stent of lesions. I did offer to admit the patient to the [place] if possible and the family at this time declined. The son, who is the power of attorney, Seiya Borke, states they've prefer the patient to go home given her workup here. He will follow closely with him as an outpatient. There is no evidence of an acute urinary tract infection, the patient does not have any evidence of ongoing ischemia or chest pain, he does not have any evidence of any other acute pathology that we have detected on her blood work thus far. The family does understand the limitations of our workup in the emergency department, they do understand that a borderline positive troponin could be of significance and that it might be safer to admit him with usual caveats also understood of the risks of transport in a patient his age as well as hospitalization risks in terms of disorientation etc. The patient's son, Mr. Hallum, states "I've always been a fan of doing less and making sure he is comfortable" and I feel that this is a reasonable approach for them to take. We will therefore discharge the patient with close outpatient follow-up.   Schuyler Amor, MD 10/20/16 1045

## 2016-10-20 NOTE — Discharge Instructions (Signed)
At this time, your son feels that you are to be discharged. This is a reasonable plan. If there is any concern about change in mental status or chest pain or any other issues please return to the emergency room. Follow closely with your doctors at the [place].

## 2016-10-22 DIAGNOSIS — N400 Enlarged prostate without lower urinary tract symptoms: Secondary | ICD-10-CM | POA: Diagnosis not present

## 2016-10-22 DIAGNOSIS — I2510 Atherosclerotic heart disease of native coronary artery without angina pectoris: Secondary | ICD-10-CM | POA: Diagnosis not present

## 2016-10-22 DIAGNOSIS — F411 Generalized anxiety disorder: Secondary | ICD-10-CM | POA: Diagnosis not present

## 2016-10-22 DIAGNOSIS — M5416 Radiculopathy, lumbar region: Secondary | ICD-10-CM | POA: Diagnosis not present

## 2016-10-22 DIAGNOSIS — G20 Parkinson's disease: Secondary | ICD-10-CM | POA: Diagnosis not present

## 2016-10-22 DIAGNOSIS — E785 Hyperlipidemia, unspecified: Secondary | ICD-10-CM | POA: Diagnosis not present

## 2016-10-22 DIAGNOSIS — F329 Major depressive disorder, single episode, unspecified: Secondary | ICD-10-CM | POA: Diagnosis not present

## 2016-10-22 DIAGNOSIS — I4891 Unspecified atrial fibrillation: Secondary | ICD-10-CM | POA: Diagnosis not present

## 2016-10-22 DIAGNOSIS — F0280 Dementia in other diseases classified elsewhere without behavioral disturbance: Secondary | ICD-10-CM | POA: Diagnosis not present

## 2016-10-22 DIAGNOSIS — Z8673 Personal history of transient ischemic attack (TIA), and cerebral infarction without residual deficits: Secondary | ICD-10-CM | POA: Diagnosis not present

## 2016-10-22 DIAGNOSIS — G629 Polyneuropathy, unspecified: Secondary | ICD-10-CM | POA: Diagnosis not present

## 2016-10-27 DIAGNOSIS — G629 Polyneuropathy, unspecified: Secondary | ICD-10-CM | POA: Diagnosis not present

## 2016-10-27 DIAGNOSIS — I2510 Atherosclerotic heart disease of native coronary artery without angina pectoris: Secondary | ICD-10-CM | POA: Diagnosis not present

## 2016-10-27 DIAGNOSIS — M5416 Radiculopathy, lumbar region: Secondary | ICD-10-CM | POA: Diagnosis not present

## 2016-10-27 DIAGNOSIS — G20 Parkinson's disease: Secondary | ICD-10-CM | POA: Diagnosis not present

## 2016-10-27 DIAGNOSIS — I4891 Unspecified atrial fibrillation: Secondary | ICD-10-CM | POA: Diagnosis not present

## 2016-10-27 DIAGNOSIS — F0280 Dementia in other diseases classified elsewhere without behavioral disturbance: Secondary | ICD-10-CM | POA: Diagnosis not present

## 2016-10-31 DIAGNOSIS — I4891 Unspecified atrial fibrillation: Secondary | ICD-10-CM | POA: Diagnosis not present

## 2016-10-31 DIAGNOSIS — M5416 Radiculopathy, lumbar region: Secondary | ICD-10-CM | POA: Diagnosis not present

## 2016-10-31 DIAGNOSIS — F0280 Dementia in other diseases classified elsewhere without behavioral disturbance: Secondary | ICD-10-CM | POA: Diagnosis not present

## 2016-10-31 DIAGNOSIS — G629 Polyneuropathy, unspecified: Secondary | ICD-10-CM | POA: Diagnosis not present

## 2016-10-31 DIAGNOSIS — I2510 Atherosclerotic heart disease of native coronary artery without angina pectoris: Secondary | ICD-10-CM | POA: Diagnosis not present

## 2016-10-31 DIAGNOSIS — G20 Parkinson's disease: Secondary | ICD-10-CM | POA: Diagnosis not present

## 2016-11-02 DIAGNOSIS — G629 Polyneuropathy, unspecified: Secondary | ICD-10-CM | POA: Diagnosis not present

## 2016-11-02 DIAGNOSIS — M5416 Radiculopathy, lumbar region: Secondary | ICD-10-CM | POA: Diagnosis not present

## 2016-11-02 DIAGNOSIS — G20 Parkinson's disease: Secondary | ICD-10-CM | POA: Diagnosis not present

## 2016-11-02 DIAGNOSIS — I4891 Unspecified atrial fibrillation: Secondary | ICD-10-CM | POA: Diagnosis not present

## 2016-11-02 DIAGNOSIS — F0280 Dementia in other diseases classified elsewhere without behavioral disturbance: Secondary | ICD-10-CM | POA: Diagnosis not present

## 2016-11-02 DIAGNOSIS — I2510 Atherosclerotic heart disease of native coronary artery without angina pectoris: Secondary | ICD-10-CM | POA: Diagnosis not present

## 2016-11-05 DIAGNOSIS — I4891 Unspecified atrial fibrillation: Secondary | ICD-10-CM | POA: Diagnosis not present

## 2016-11-05 DIAGNOSIS — M5416 Radiculopathy, lumbar region: Secondary | ICD-10-CM | POA: Diagnosis not present

## 2016-11-05 DIAGNOSIS — I2510 Atherosclerotic heart disease of native coronary artery without angina pectoris: Secondary | ICD-10-CM | POA: Diagnosis not present

## 2016-11-05 DIAGNOSIS — F0280 Dementia in other diseases classified elsewhere without behavioral disturbance: Secondary | ICD-10-CM | POA: Diagnosis not present

## 2016-11-05 DIAGNOSIS — G20 Parkinson's disease: Secondary | ICD-10-CM | POA: Diagnosis not present

## 2016-11-05 DIAGNOSIS — G629 Polyneuropathy, unspecified: Secondary | ICD-10-CM | POA: Diagnosis not present

## 2016-11-07 DIAGNOSIS — G20 Parkinson's disease: Secondary | ICD-10-CM | POA: Diagnosis not present

## 2016-11-07 DIAGNOSIS — I2510 Atherosclerotic heart disease of native coronary artery without angina pectoris: Secondary | ICD-10-CM | POA: Diagnosis not present

## 2016-11-07 DIAGNOSIS — M5416 Radiculopathy, lumbar region: Secondary | ICD-10-CM | POA: Diagnosis not present

## 2016-11-07 DIAGNOSIS — G629 Polyneuropathy, unspecified: Secondary | ICD-10-CM | POA: Diagnosis not present

## 2016-11-07 DIAGNOSIS — I4891 Unspecified atrial fibrillation: Secondary | ICD-10-CM | POA: Diagnosis not present

## 2016-11-07 DIAGNOSIS — F0280 Dementia in other diseases classified elsewhere without behavioral disturbance: Secondary | ICD-10-CM | POA: Diagnosis not present

## 2016-11-12 DIAGNOSIS — I4891 Unspecified atrial fibrillation: Secondary | ICD-10-CM | POA: Diagnosis not present

## 2016-11-12 DIAGNOSIS — G20 Parkinson's disease: Secondary | ICD-10-CM | POA: Diagnosis not present

## 2016-11-12 DIAGNOSIS — G629 Polyneuropathy, unspecified: Secondary | ICD-10-CM | POA: Diagnosis not present

## 2016-11-12 DIAGNOSIS — I2510 Atherosclerotic heart disease of native coronary artery without angina pectoris: Secondary | ICD-10-CM | POA: Diagnosis not present

## 2016-11-12 DIAGNOSIS — F0280 Dementia in other diseases classified elsewhere without behavioral disturbance: Secondary | ICD-10-CM | POA: Diagnosis not present

## 2016-11-12 DIAGNOSIS — M5416 Radiculopathy, lumbar region: Secondary | ICD-10-CM | POA: Diagnosis not present

## 2016-11-14 DIAGNOSIS — I2510 Atherosclerotic heart disease of native coronary artery without angina pectoris: Secondary | ICD-10-CM | POA: Diagnosis not present

## 2016-11-14 DIAGNOSIS — F0280 Dementia in other diseases classified elsewhere without behavioral disturbance: Secondary | ICD-10-CM | POA: Diagnosis not present

## 2016-11-14 DIAGNOSIS — I4891 Unspecified atrial fibrillation: Secondary | ICD-10-CM | POA: Diagnosis not present

## 2016-11-14 DIAGNOSIS — M5416 Radiculopathy, lumbar region: Secondary | ICD-10-CM | POA: Diagnosis not present

## 2016-11-14 DIAGNOSIS — G629 Polyneuropathy, unspecified: Secondary | ICD-10-CM | POA: Diagnosis not present

## 2016-11-14 DIAGNOSIS — G20 Parkinson's disease: Secondary | ICD-10-CM | POA: Diagnosis not present

## 2016-11-19 DIAGNOSIS — I2510 Atherosclerotic heart disease of native coronary artery without angina pectoris: Secondary | ICD-10-CM | POA: Diagnosis not present

## 2016-11-19 DIAGNOSIS — I4891 Unspecified atrial fibrillation: Secondary | ICD-10-CM | POA: Diagnosis not present

## 2016-11-19 DIAGNOSIS — G20 Parkinson's disease: Secondary | ICD-10-CM | POA: Diagnosis not present

## 2016-11-19 DIAGNOSIS — F0280 Dementia in other diseases classified elsewhere without behavioral disturbance: Secondary | ICD-10-CM | POA: Diagnosis not present

## 2016-11-19 DIAGNOSIS — G629 Polyneuropathy, unspecified: Secondary | ICD-10-CM | POA: Diagnosis not present

## 2016-11-19 DIAGNOSIS — M5416 Radiculopathy, lumbar region: Secondary | ICD-10-CM | POA: Diagnosis not present

## 2016-11-23 DIAGNOSIS — F0280 Dementia in other diseases classified elsewhere without behavioral disturbance: Secondary | ICD-10-CM | POA: Diagnosis not present

## 2016-11-23 DIAGNOSIS — I4891 Unspecified atrial fibrillation: Secondary | ICD-10-CM | POA: Diagnosis not present

## 2016-11-23 DIAGNOSIS — G629 Polyneuropathy, unspecified: Secondary | ICD-10-CM | POA: Diagnosis not present

## 2016-11-23 DIAGNOSIS — I2510 Atherosclerotic heart disease of native coronary artery without angina pectoris: Secondary | ICD-10-CM | POA: Diagnosis not present

## 2016-11-23 DIAGNOSIS — G20 Parkinson's disease: Secondary | ICD-10-CM | POA: Diagnosis not present

## 2016-11-23 DIAGNOSIS — M5416 Radiculopathy, lumbar region: Secondary | ICD-10-CM | POA: Diagnosis not present

## 2016-11-27 DIAGNOSIS — I4891 Unspecified atrial fibrillation: Secondary | ICD-10-CM | POA: Diagnosis not present

## 2016-11-27 DIAGNOSIS — I2510 Atherosclerotic heart disease of native coronary artery without angina pectoris: Secondary | ICD-10-CM | POA: Diagnosis not present

## 2016-11-27 DIAGNOSIS — G20 Parkinson's disease: Secondary | ICD-10-CM | POA: Diagnosis not present

## 2016-11-27 DIAGNOSIS — G629 Polyneuropathy, unspecified: Secondary | ICD-10-CM | POA: Diagnosis not present

## 2016-11-27 DIAGNOSIS — F0280 Dementia in other diseases classified elsewhere without behavioral disturbance: Secondary | ICD-10-CM | POA: Diagnosis not present

## 2016-11-27 DIAGNOSIS — M5416 Radiculopathy, lumbar region: Secondary | ICD-10-CM | POA: Diagnosis not present

## 2016-11-29 DIAGNOSIS — G629 Polyneuropathy, unspecified: Secondary | ICD-10-CM | POA: Diagnosis not present

## 2016-11-29 DIAGNOSIS — I2510 Atherosclerotic heart disease of native coronary artery without angina pectoris: Secondary | ICD-10-CM | POA: Diagnosis not present

## 2016-11-29 DIAGNOSIS — F0280 Dementia in other diseases classified elsewhere without behavioral disturbance: Secondary | ICD-10-CM | POA: Diagnosis not present

## 2016-11-29 DIAGNOSIS — I4891 Unspecified atrial fibrillation: Secondary | ICD-10-CM | POA: Diagnosis not present

## 2016-11-29 DIAGNOSIS — M5416 Radiculopathy, lumbar region: Secondary | ICD-10-CM | POA: Diagnosis not present

## 2016-11-29 DIAGNOSIS — G20 Parkinson's disease: Secondary | ICD-10-CM | POA: Diagnosis not present

## 2016-12-03 DIAGNOSIS — M5416 Radiculopathy, lumbar region: Secondary | ICD-10-CM | POA: Diagnosis not present

## 2016-12-03 DIAGNOSIS — G20 Parkinson's disease: Secondary | ICD-10-CM | POA: Diagnosis not present

## 2016-12-03 DIAGNOSIS — I2510 Atherosclerotic heart disease of native coronary artery without angina pectoris: Secondary | ICD-10-CM | POA: Diagnosis not present

## 2016-12-03 DIAGNOSIS — G629 Polyneuropathy, unspecified: Secondary | ICD-10-CM | POA: Diagnosis not present

## 2016-12-03 DIAGNOSIS — F0280 Dementia in other diseases classified elsewhere without behavioral disturbance: Secondary | ICD-10-CM | POA: Diagnosis not present

## 2016-12-03 DIAGNOSIS — I4891 Unspecified atrial fibrillation: Secondary | ICD-10-CM | POA: Diagnosis not present

## 2016-12-06 DIAGNOSIS — F0280 Dementia in other diseases classified elsewhere without behavioral disturbance: Secondary | ICD-10-CM | POA: Diagnosis not present

## 2016-12-06 DIAGNOSIS — M5416 Radiculopathy, lumbar region: Secondary | ICD-10-CM | POA: Diagnosis not present

## 2016-12-06 DIAGNOSIS — I2510 Atherosclerotic heart disease of native coronary artery without angina pectoris: Secondary | ICD-10-CM | POA: Diagnosis not present

## 2016-12-06 DIAGNOSIS — G629 Polyneuropathy, unspecified: Secondary | ICD-10-CM | POA: Diagnosis not present

## 2016-12-06 DIAGNOSIS — I4891 Unspecified atrial fibrillation: Secondary | ICD-10-CM | POA: Diagnosis not present

## 2016-12-06 DIAGNOSIS — G20 Parkinson's disease: Secondary | ICD-10-CM | POA: Diagnosis not present

## 2016-12-10 DIAGNOSIS — F0280 Dementia in other diseases classified elsewhere without behavioral disturbance: Secondary | ICD-10-CM | POA: Diagnosis not present

## 2016-12-10 DIAGNOSIS — G629 Polyneuropathy, unspecified: Secondary | ICD-10-CM | POA: Diagnosis not present

## 2016-12-10 DIAGNOSIS — G20 Parkinson's disease: Secondary | ICD-10-CM | POA: Diagnosis not present

## 2016-12-10 DIAGNOSIS — I4891 Unspecified atrial fibrillation: Secondary | ICD-10-CM | POA: Diagnosis not present

## 2016-12-10 DIAGNOSIS — M5416 Radiculopathy, lumbar region: Secondary | ICD-10-CM | POA: Diagnosis not present

## 2016-12-10 DIAGNOSIS — I2510 Atherosclerotic heart disease of native coronary artery without angina pectoris: Secondary | ICD-10-CM | POA: Diagnosis not present

## 2016-12-12 DIAGNOSIS — F0280 Dementia in other diseases classified elsewhere without behavioral disturbance: Secondary | ICD-10-CM | POA: Diagnosis not present

## 2016-12-12 DIAGNOSIS — M5416 Radiculopathy, lumbar region: Secondary | ICD-10-CM | POA: Diagnosis not present

## 2016-12-12 DIAGNOSIS — I4891 Unspecified atrial fibrillation: Secondary | ICD-10-CM | POA: Diagnosis not present

## 2016-12-12 DIAGNOSIS — G20 Parkinson's disease: Secondary | ICD-10-CM | POA: Diagnosis not present

## 2016-12-12 DIAGNOSIS — G629 Polyneuropathy, unspecified: Secondary | ICD-10-CM | POA: Diagnosis not present

## 2016-12-12 DIAGNOSIS — I2510 Atherosclerotic heart disease of native coronary artery without angina pectoris: Secondary | ICD-10-CM | POA: Diagnosis not present

## 2016-12-18 DIAGNOSIS — F0280 Dementia in other diseases classified elsewhere without behavioral disturbance: Secondary | ICD-10-CM | POA: Diagnosis not present

## 2016-12-18 DIAGNOSIS — I4891 Unspecified atrial fibrillation: Secondary | ICD-10-CM | POA: Diagnosis not present

## 2016-12-18 DIAGNOSIS — G20 Parkinson's disease: Secondary | ICD-10-CM | POA: Diagnosis not present

## 2016-12-18 DIAGNOSIS — M5416 Radiculopathy, lumbar region: Secondary | ICD-10-CM | POA: Diagnosis not present

## 2016-12-18 DIAGNOSIS — I2510 Atherosclerotic heart disease of native coronary artery without angina pectoris: Secondary | ICD-10-CM | POA: Diagnosis not present

## 2016-12-18 DIAGNOSIS — G629 Polyneuropathy, unspecified: Secondary | ICD-10-CM | POA: Diagnosis not present

## 2016-12-21 DIAGNOSIS — F329 Major depressive disorder, single episode, unspecified: Secondary | ICD-10-CM | POA: Diagnosis not present

## 2016-12-21 DIAGNOSIS — G629 Polyneuropathy, unspecified: Secondary | ICD-10-CM | POA: Diagnosis not present

## 2016-12-21 DIAGNOSIS — Z8673 Personal history of transient ischemic attack (TIA), and cerebral infarction without residual deficits: Secondary | ICD-10-CM | POA: Diagnosis not present

## 2016-12-21 DIAGNOSIS — M5416 Radiculopathy, lumbar region: Secondary | ICD-10-CM | POA: Diagnosis not present

## 2016-12-21 DIAGNOSIS — I2510 Atherosclerotic heart disease of native coronary artery without angina pectoris: Secondary | ICD-10-CM | POA: Diagnosis not present

## 2016-12-21 DIAGNOSIS — Z7982 Long term (current) use of aspirin: Secondary | ICD-10-CM | POA: Diagnosis not present

## 2016-12-21 DIAGNOSIS — G20 Parkinson's disease: Secondary | ICD-10-CM | POA: Diagnosis not present

## 2016-12-21 DIAGNOSIS — F0280 Dementia in other diseases classified elsewhere without behavioral disturbance: Secondary | ICD-10-CM | POA: Diagnosis not present

## 2016-12-21 DIAGNOSIS — F411 Generalized anxiety disorder: Secondary | ICD-10-CM | POA: Diagnosis not present

## 2016-12-21 DIAGNOSIS — N400 Enlarged prostate without lower urinary tract symptoms: Secondary | ICD-10-CM | POA: Diagnosis not present

## 2016-12-21 DIAGNOSIS — I4891 Unspecified atrial fibrillation: Secondary | ICD-10-CM | POA: Diagnosis not present

## 2016-12-21 DIAGNOSIS — E785 Hyperlipidemia, unspecified: Secondary | ICD-10-CM | POA: Diagnosis not present

## 2016-12-25 DIAGNOSIS — F0280 Dementia in other diseases classified elsewhere without behavioral disturbance: Secondary | ICD-10-CM | POA: Diagnosis not present

## 2016-12-25 DIAGNOSIS — G20 Parkinson's disease: Secondary | ICD-10-CM | POA: Diagnosis not present

## 2016-12-25 DIAGNOSIS — I2510 Atherosclerotic heart disease of native coronary artery without angina pectoris: Secondary | ICD-10-CM | POA: Diagnosis not present

## 2016-12-25 DIAGNOSIS — M5416 Radiculopathy, lumbar region: Secondary | ICD-10-CM | POA: Diagnosis not present

## 2016-12-25 DIAGNOSIS — G629 Polyneuropathy, unspecified: Secondary | ICD-10-CM | POA: Diagnosis not present

## 2016-12-25 DIAGNOSIS — I4891 Unspecified atrial fibrillation: Secondary | ICD-10-CM | POA: Diagnosis not present

## 2016-12-26 DIAGNOSIS — I2510 Atherosclerotic heart disease of native coronary artery without angina pectoris: Secondary | ICD-10-CM | POA: Diagnosis not present

## 2016-12-26 DIAGNOSIS — F0280 Dementia in other diseases classified elsewhere without behavioral disturbance: Secondary | ICD-10-CM | POA: Diagnosis not present

## 2016-12-26 DIAGNOSIS — I4891 Unspecified atrial fibrillation: Secondary | ICD-10-CM | POA: Diagnosis not present

## 2016-12-26 DIAGNOSIS — G629 Polyneuropathy, unspecified: Secondary | ICD-10-CM | POA: Diagnosis not present

## 2016-12-26 DIAGNOSIS — G20 Parkinson's disease: Secondary | ICD-10-CM | POA: Diagnosis not present

## 2016-12-26 DIAGNOSIS — M5416 Radiculopathy, lumbar region: Secondary | ICD-10-CM | POA: Diagnosis not present

## 2016-12-28 DIAGNOSIS — G20 Parkinson's disease: Secondary | ICD-10-CM | POA: Diagnosis not present

## 2016-12-28 DIAGNOSIS — I4891 Unspecified atrial fibrillation: Secondary | ICD-10-CM | POA: Diagnosis not present

## 2016-12-28 DIAGNOSIS — I2510 Atherosclerotic heart disease of native coronary artery without angina pectoris: Secondary | ICD-10-CM | POA: Diagnosis not present

## 2016-12-28 DIAGNOSIS — G629 Polyneuropathy, unspecified: Secondary | ICD-10-CM | POA: Diagnosis not present

## 2016-12-28 DIAGNOSIS — F0280 Dementia in other diseases classified elsewhere without behavioral disturbance: Secondary | ICD-10-CM | POA: Diagnosis not present

## 2016-12-28 DIAGNOSIS — M5416 Radiculopathy, lumbar region: Secondary | ICD-10-CM | POA: Diagnosis not present

## 2016-12-31 DIAGNOSIS — I2510 Atherosclerotic heart disease of native coronary artery without angina pectoris: Secondary | ICD-10-CM | POA: Diagnosis not present

## 2016-12-31 DIAGNOSIS — F0280 Dementia in other diseases classified elsewhere without behavioral disturbance: Secondary | ICD-10-CM | POA: Diagnosis not present

## 2016-12-31 DIAGNOSIS — I4891 Unspecified atrial fibrillation: Secondary | ICD-10-CM | POA: Diagnosis not present

## 2016-12-31 DIAGNOSIS — G629 Polyneuropathy, unspecified: Secondary | ICD-10-CM | POA: Diagnosis not present

## 2016-12-31 DIAGNOSIS — G20 Parkinson's disease: Secondary | ICD-10-CM | POA: Diagnosis not present

## 2016-12-31 DIAGNOSIS — M5416 Radiculopathy, lumbar region: Secondary | ICD-10-CM | POA: Diagnosis not present

## 2017-01-03 DIAGNOSIS — F0280 Dementia in other diseases classified elsewhere without behavioral disturbance: Secondary | ICD-10-CM | POA: Diagnosis not present

## 2017-01-03 DIAGNOSIS — G629 Polyneuropathy, unspecified: Secondary | ICD-10-CM | POA: Diagnosis not present

## 2017-01-03 DIAGNOSIS — I4891 Unspecified atrial fibrillation: Secondary | ICD-10-CM | POA: Diagnosis not present

## 2017-01-03 DIAGNOSIS — G20 Parkinson's disease: Secondary | ICD-10-CM | POA: Diagnosis not present

## 2017-01-03 DIAGNOSIS — I2510 Atherosclerotic heart disease of native coronary artery without angina pectoris: Secondary | ICD-10-CM | POA: Diagnosis not present

## 2017-01-03 DIAGNOSIS — M5416 Radiculopathy, lumbar region: Secondary | ICD-10-CM | POA: Diagnosis not present

## 2017-01-04 DIAGNOSIS — I4891 Unspecified atrial fibrillation: Secondary | ICD-10-CM | POA: Diagnosis not present

## 2017-01-04 DIAGNOSIS — M5416 Radiculopathy, lumbar region: Secondary | ICD-10-CM | POA: Diagnosis not present

## 2017-01-04 DIAGNOSIS — F0280 Dementia in other diseases classified elsewhere without behavioral disturbance: Secondary | ICD-10-CM | POA: Diagnosis not present

## 2017-01-04 DIAGNOSIS — I2510 Atherosclerotic heart disease of native coronary artery without angina pectoris: Secondary | ICD-10-CM | POA: Diagnosis not present

## 2017-01-04 DIAGNOSIS — G629 Polyneuropathy, unspecified: Secondary | ICD-10-CM | POA: Diagnosis not present

## 2017-01-04 DIAGNOSIS — G20 Parkinson's disease: Secondary | ICD-10-CM | POA: Diagnosis not present

## 2017-01-11 DIAGNOSIS — M5416 Radiculopathy, lumbar region: Secondary | ICD-10-CM | POA: Diagnosis not present

## 2017-01-11 DIAGNOSIS — I4891 Unspecified atrial fibrillation: Secondary | ICD-10-CM | POA: Diagnosis not present

## 2017-01-11 DIAGNOSIS — G629 Polyneuropathy, unspecified: Secondary | ICD-10-CM | POA: Diagnosis not present

## 2017-01-11 DIAGNOSIS — I2510 Atherosclerotic heart disease of native coronary artery without angina pectoris: Secondary | ICD-10-CM | POA: Diagnosis not present

## 2017-01-11 DIAGNOSIS — F0280 Dementia in other diseases classified elsewhere without behavioral disturbance: Secondary | ICD-10-CM | POA: Diagnosis not present

## 2017-01-11 DIAGNOSIS — G20 Parkinson's disease: Secondary | ICD-10-CM | POA: Diagnosis not present

## 2017-01-12 DIAGNOSIS — G20 Parkinson's disease: Secondary | ICD-10-CM | POA: Diagnosis not present

## 2017-01-12 DIAGNOSIS — F0280 Dementia in other diseases classified elsewhere without behavioral disturbance: Secondary | ICD-10-CM | POA: Diagnosis not present

## 2017-01-12 DIAGNOSIS — I4891 Unspecified atrial fibrillation: Secondary | ICD-10-CM | POA: Diagnosis not present

## 2017-01-12 DIAGNOSIS — M5416 Radiculopathy, lumbar region: Secondary | ICD-10-CM | POA: Diagnosis not present

## 2017-01-12 DIAGNOSIS — G629 Polyneuropathy, unspecified: Secondary | ICD-10-CM | POA: Diagnosis not present

## 2017-01-12 DIAGNOSIS — I2510 Atherosclerotic heart disease of native coronary artery without angina pectoris: Secondary | ICD-10-CM | POA: Diagnosis not present

## 2017-01-14 DIAGNOSIS — I2510 Atherosclerotic heart disease of native coronary artery without angina pectoris: Secondary | ICD-10-CM | POA: Diagnosis not present

## 2017-01-14 DIAGNOSIS — F0280 Dementia in other diseases classified elsewhere without behavioral disturbance: Secondary | ICD-10-CM | POA: Diagnosis not present

## 2017-01-14 DIAGNOSIS — G20 Parkinson's disease: Secondary | ICD-10-CM | POA: Diagnosis not present

## 2017-01-14 DIAGNOSIS — G629 Polyneuropathy, unspecified: Secondary | ICD-10-CM | POA: Diagnosis not present

## 2017-01-14 DIAGNOSIS — I4891 Unspecified atrial fibrillation: Secondary | ICD-10-CM | POA: Diagnosis not present

## 2017-01-14 DIAGNOSIS — M5416 Radiculopathy, lumbar region: Secondary | ICD-10-CM | POA: Diagnosis not present

## 2017-01-15 DIAGNOSIS — G20 Parkinson's disease: Secondary | ICD-10-CM | POA: Diagnosis not present

## 2017-01-15 DIAGNOSIS — F0280 Dementia in other diseases classified elsewhere without behavioral disturbance: Secondary | ICD-10-CM | POA: Diagnosis not present

## 2017-01-15 DIAGNOSIS — I4891 Unspecified atrial fibrillation: Secondary | ICD-10-CM | POA: Diagnosis not present

## 2017-01-15 DIAGNOSIS — M5416 Radiculopathy, lumbar region: Secondary | ICD-10-CM | POA: Diagnosis not present

## 2017-01-15 DIAGNOSIS — I2510 Atherosclerotic heart disease of native coronary artery without angina pectoris: Secondary | ICD-10-CM | POA: Diagnosis not present

## 2017-01-15 DIAGNOSIS — G629 Polyneuropathy, unspecified: Secondary | ICD-10-CM | POA: Diagnosis not present

## 2017-01-18 DIAGNOSIS — F0280 Dementia in other diseases classified elsewhere without behavioral disturbance: Secondary | ICD-10-CM | POA: Diagnosis not present

## 2017-01-18 DIAGNOSIS — M5416 Radiculopathy, lumbar region: Secondary | ICD-10-CM | POA: Diagnosis not present

## 2017-01-18 DIAGNOSIS — G629 Polyneuropathy, unspecified: Secondary | ICD-10-CM | POA: Diagnosis not present

## 2017-01-18 DIAGNOSIS — G20 Parkinson's disease: Secondary | ICD-10-CM | POA: Diagnosis not present

## 2017-01-18 DIAGNOSIS — I2510 Atherosclerotic heart disease of native coronary artery without angina pectoris: Secondary | ICD-10-CM | POA: Diagnosis not present

## 2017-01-18 DIAGNOSIS — I4891 Unspecified atrial fibrillation: Secondary | ICD-10-CM | POA: Diagnosis not present

## 2017-01-21 DIAGNOSIS — I2510 Atherosclerotic heart disease of native coronary artery without angina pectoris: Secondary | ICD-10-CM | POA: Diagnosis not present

## 2017-01-21 DIAGNOSIS — G629 Polyneuropathy, unspecified: Secondary | ICD-10-CM | POA: Diagnosis not present

## 2017-01-21 DIAGNOSIS — M5416 Radiculopathy, lumbar region: Secondary | ICD-10-CM | POA: Diagnosis not present

## 2017-01-21 DIAGNOSIS — G20 Parkinson's disease: Secondary | ICD-10-CM | POA: Diagnosis not present

## 2017-01-21 DIAGNOSIS — I4891 Unspecified atrial fibrillation: Secondary | ICD-10-CM | POA: Diagnosis not present

## 2017-01-21 DIAGNOSIS — F0280 Dementia in other diseases classified elsewhere without behavioral disturbance: Secondary | ICD-10-CM | POA: Diagnosis not present

## 2017-01-23 DIAGNOSIS — G20 Parkinson's disease: Secondary | ICD-10-CM | POA: Diagnosis not present

## 2017-01-23 DIAGNOSIS — I4891 Unspecified atrial fibrillation: Secondary | ICD-10-CM | POA: Diagnosis not present

## 2017-01-23 DIAGNOSIS — I2510 Atherosclerotic heart disease of native coronary artery without angina pectoris: Secondary | ICD-10-CM | POA: Diagnosis not present

## 2017-01-23 DIAGNOSIS — F0280 Dementia in other diseases classified elsewhere without behavioral disturbance: Secondary | ICD-10-CM | POA: Diagnosis not present

## 2017-01-23 DIAGNOSIS — M5416 Radiculopathy, lumbar region: Secondary | ICD-10-CM | POA: Diagnosis not present

## 2017-01-23 DIAGNOSIS — G629 Polyneuropathy, unspecified: Secondary | ICD-10-CM | POA: Diagnosis not present

## 2017-01-28 DIAGNOSIS — F0280 Dementia in other diseases classified elsewhere without behavioral disturbance: Secondary | ICD-10-CM | POA: Diagnosis not present

## 2017-01-28 DIAGNOSIS — I4891 Unspecified atrial fibrillation: Secondary | ICD-10-CM | POA: Diagnosis not present

## 2017-01-28 DIAGNOSIS — M5416 Radiculopathy, lumbar region: Secondary | ICD-10-CM | POA: Diagnosis not present

## 2017-01-28 DIAGNOSIS — I2510 Atherosclerotic heart disease of native coronary artery without angina pectoris: Secondary | ICD-10-CM | POA: Diagnosis not present

## 2017-01-28 DIAGNOSIS — G629 Polyneuropathy, unspecified: Secondary | ICD-10-CM | POA: Diagnosis not present

## 2017-01-28 DIAGNOSIS — G20 Parkinson's disease: Secondary | ICD-10-CM | POA: Diagnosis not present

## 2017-01-30 DIAGNOSIS — G629 Polyneuropathy, unspecified: Secondary | ICD-10-CM | POA: Diagnosis not present

## 2017-01-30 DIAGNOSIS — I4891 Unspecified atrial fibrillation: Secondary | ICD-10-CM | POA: Diagnosis not present

## 2017-01-30 DIAGNOSIS — M5416 Radiculopathy, lumbar region: Secondary | ICD-10-CM | POA: Diagnosis not present

## 2017-01-30 DIAGNOSIS — I2510 Atherosclerotic heart disease of native coronary artery without angina pectoris: Secondary | ICD-10-CM | POA: Diagnosis not present

## 2017-01-30 DIAGNOSIS — F0280 Dementia in other diseases classified elsewhere without behavioral disturbance: Secondary | ICD-10-CM | POA: Diagnosis not present

## 2017-01-30 DIAGNOSIS — G20 Parkinson's disease: Secondary | ICD-10-CM | POA: Diagnosis not present

## 2017-02-05 DIAGNOSIS — G20 Parkinson's disease: Secondary | ICD-10-CM | POA: Diagnosis not present

## 2017-02-05 DIAGNOSIS — F0280 Dementia in other diseases classified elsewhere without behavioral disturbance: Secondary | ICD-10-CM | POA: Diagnosis not present

## 2017-02-05 DIAGNOSIS — I4891 Unspecified atrial fibrillation: Secondary | ICD-10-CM | POA: Diagnosis not present

## 2017-02-05 DIAGNOSIS — M5416 Radiculopathy, lumbar region: Secondary | ICD-10-CM | POA: Diagnosis not present

## 2017-02-05 DIAGNOSIS — I2510 Atherosclerotic heart disease of native coronary artery without angina pectoris: Secondary | ICD-10-CM | POA: Diagnosis not present

## 2017-02-05 DIAGNOSIS — G629 Polyneuropathy, unspecified: Secondary | ICD-10-CM | POA: Diagnosis not present

## 2017-02-07 DIAGNOSIS — M5416 Radiculopathy, lumbar region: Secondary | ICD-10-CM | POA: Diagnosis not present

## 2017-02-07 DIAGNOSIS — G20 Parkinson's disease: Secondary | ICD-10-CM | POA: Diagnosis not present

## 2017-02-07 DIAGNOSIS — F0280 Dementia in other diseases classified elsewhere without behavioral disturbance: Secondary | ICD-10-CM | POA: Diagnosis not present

## 2017-02-07 DIAGNOSIS — G629 Polyneuropathy, unspecified: Secondary | ICD-10-CM | POA: Diagnosis not present

## 2017-02-07 DIAGNOSIS — I2510 Atherosclerotic heart disease of native coronary artery without angina pectoris: Secondary | ICD-10-CM | POA: Diagnosis not present

## 2017-02-07 DIAGNOSIS — I4891 Unspecified atrial fibrillation: Secondary | ICD-10-CM | POA: Diagnosis not present

## 2017-02-12 DIAGNOSIS — I4891 Unspecified atrial fibrillation: Secondary | ICD-10-CM | POA: Diagnosis not present

## 2017-02-12 DIAGNOSIS — G20 Parkinson's disease: Secondary | ICD-10-CM | POA: Diagnosis not present

## 2017-02-12 DIAGNOSIS — I2510 Atherosclerotic heart disease of native coronary artery without angina pectoris: Secondary | ICD-10-CM | POA: Diagnosis not present

## 2017-02-12 DIAGNOSIS — F0280 Dementia in other diseases classified elsewhere without behavioral disturbance: Secondary | ICD-10-CM | POA: Diagnosis not present

## 2017-02-12 DIAGNOSIS — G629 Polyneuropathy, unspecified: Secondary | ICD-10-CM | POA: Diagnosis not present

## 2017-02-12 DIAGNOSIS — M5416 Radiculopathy, lumbar region: Secondary | ICD-10-CM | POA: Diagnosis not present

## 2017-02-14 DIAGNOSIS — F0280 Dementia in other diseases classified elsewhere without behavioral disturbance: Secondary | ICD-10-CM | POA: Diagnosis not present

## 2017-02-14 DIAGNOSIS — I4891 Unspecified atrial fibrillation: Secondary | ICD-10-CM | POA: Diagnosis not present

## 2017-02-14 DIAGNOSIS — G629 Polyneuropathy, unspecified: Secondary | ICD-10-CM | POA: Diagnosis not present

## 2017-02-14 DIAGNOSIS — M5416 Radiculopathy, lumbar region: Secondary | ICD-10-CM | POA: Diagnosis not present

## 2017-02-14 DIAGNOSIS — I2510 Atherosclerotic heart disease of native coronary artery without angina pectoris: Secondary | ICD-10-CM | POA: Diagnosis not present

## 2017-02-14 DIAGNOSIS — G20 Parkinson's disease: Secondary | ICD-10-CM | POA: Diagnosis not present

## 2017-02-19 ENCOUNTER — Emergency Department: Payer: Medicare Other

## 2017-02-19 ENCOUNTER — Encounter: Payer: Self-pay | Admitting: Radiology

## 2017-02-19 ENCOUNTER — Emergency Department
Admission: EM | Admit: 2017-02-19 | Discharge: 2017-02-19 | Disposition: A | Discharge status: Home / Self Care | Payer: Medicare Other | Attending: Emergency Medicine | Admitting: Emergency Medicine

## 2017-02-19 DIAGNOSIS — F0280 Dementia in other diseases classified elsewhere without behavioral disturbance: Secondary | ICD-10-CM | POA: Diagnosis not present

## 2017-02-19 DIAGNOSIS — F411 Generalized anxiety disorder: Secondary | ICD-10-CM | POA: Diagnosis not present

## 2017-02-19 DIAGNOSIS — Z8673 Personal history of transient ischemic attack (TIA), and cerebral infarction without residual deficits: Secondary | ICD-10-CM | POA: Diagnosis not present

## 2017-02-19 DIAGNOSIS — Z743 Need for continuous supervision: Secondary | ICD-10-CM | POA: Diagnosis not present

## 2017-02-19 DIAGNOSIS — E785 Hyperlipidemia, unspecified: Secondary | ICD-10-CM | POA: Diagnosis not present

## 2017-02-19 DIAGNOSIS — N400 Enlarged prostate without lower urinary tract symptoms: Secondary | ICD-10-CM | POA: Diagnosis not present

## 2017-02-19 DIAGNOSIS — Z7982 Long term (current) use of aspirin: Secondary | ICD-10-CM | POA: Diagnosis not present

## 2017-02-19 DIAGNOSIS — R51 Headache: Secondary | ICD-10-CM | POA: Diagnosis not present

## 2017-02-19 DIAGNOSIS — I2510 Atherosclerotic heart disease of native coronary artery without angina pectoris: Secondary | ICD-10-CM | POA: Diagnosis not present

## 2017-02-19 DIAGNOSIS — I110 Hypertensive heart disease with heart failure: Secondary | ICD-10-CM | POA: Insufficient documentation

## 2017-02-19 DIAGNOSIS — I509 Heart failure, unspecified: Secondary | ICD-10-CM | POA: Diagnosis not present

## 2017-02-19 DIAGNOSIS — I4891 Unspecified atrial fibrillation: Secondary | ICD-10-CM | POA: Diagnosis not present

## 2017-02-19 DIAGNOSIS — Z87891 Personal history of nicotine dependence: Secondary | ICD-10-CM | POA: Diagnosis not present

## 2017-02-19 DIAGNOSIS — F329 Major depressive disorder, single episode, unspecified: Secondary | ICD-10-CM | POA: Diagnosis not present

## 2017-02-19 DIAGNOSIS — Z79899 Other long term (current) drug therapy: Secondary | ICD-10-CM | POA: Diagnosis not present

## 2017-02-19 DIAGNOSIS — R1013 Epigastric pain: Secondary | ICD-10-CM

## 2017-02-19 DIAGNOSIS — K5900 Constipation, unspecified: Secondary | ICD-10-CM | POA: Diagnosis not present

## 2017-02-19 DIAGNOSIS — G629 Polyneuropathy, unspecified: Secondary | ICD-10-CM | POA: Diagnosis not present

## 2017-02-19 DIAGNOSIS — G20 Parkinson's disease: Secondary | ICD-10-CM | POA: Diagnosis not present

## 2017-02-19 DIAGNOSIS — R109 Unspecified abdominal pain: Secondary | ICD-10-CM | POA: Diagnosis not present

## 2017-02-19 DIAGNOSIS — M5416 Radiculopathy, lumbar region: Secondary | ICD-10-CM | POA: Diagnosis not present

## 2017-02-19 DIAGNOSIS — R1030 Lower abdominal pain, unspecified: Secondary | ICD-10-CM | POA: Diagnosis not present

## 2017-02-19 LAB — COMPREHENSIVE METABOLIC PANEL
ALK PHOS: 43 U/L (ref 38–126)
ALT: 8 U/L — ABNORMAL LOW (ref 17–63)
ANION GAP: 7 (ref 5–15)
AST: 21 U/L (ref 15–41)
Albumin: 3.6 g/dL (ref 3.5–5.0)
BILIRUBIN TOTAL: 1.4 mg/dL — AB (ref 0.3–1.2)
BUN: 25 mg/dL — ABNORMAL HIGH (ref 6–20)
CALCIUM: 8.9 mg/dL (ref 8.9–10.3)
CO2: 27 mmol/L (ref 22–32)
Chloride: 104 mmol/L (ref 101–111)
Creatinine, Ser: 0.95 mg/dL (ref 0.61–1.24)
GFR calc non Af Amer: >60 mL/min (ref >60)
Glucose, Bld: 104 mg/dL — ABNORMAL HIGH (ref 65–99)
POTASSIUM: 3.3 mmol/L — AB (ref 3.5–5.1)
Sodium: 138 mmol/L (ref 135–145)
TOTAL PROTEIN: 6.6 g/dL (ref 6.5–8.1)

## 2017-02-19 LAB — URINALYSIS, COMPLETE (UACMP) WITH MICROSCOPIC
Bacteria, UA: NONE SEEN
Bilirubin Urine: NEGATIVE
GLUCOSE, UA: NEGATIVE mg/dL
Hgb urine dipstick: NEGATIVE
Ketones, ur: 5 mg/dL — AB
Leukocytes, UA: NEGATIVE
NITRITE: NEGATIVE
PH: 5 (ref 5.0–8.0)
PROTEIN: NEGATIVE mg/dL
RBC / HPF: NONE SEEN RBC/hpf (ref 0–5)
SPECIFIC GRAVITY, URINE: 1.042 — AB (ref 1.005–1.030)

## 2017-02-19 LAB — CBC
HEMATOCRIT: 37.9 % — AB (ref 40.0–52.0)
HEMOGLOBIN: 12.9 g/dL — AB (ref 13.0–18.0)
MCH: 30.9 pg (ref 26.0–34.0)
MCHC: 34.1 g/dL (ref 32.0–36.0)
MCV: 90.8 fL (ref 80.0–100.0)
Platelets: 152 10*3/uL (ref 150–440)
RBC: 4.17 MIL/uL — ABNORMAL LOW (ref 4.40–5.90)
RDW: 15.5 % — AB (ref 11.5–14.5)
WBC: 5.9 10*3/uL (ref 3.8–10.6)

## 2017-02-19 LAB — TROPONIN I

## 2017-02-19 LAB — LIPASE, BLOOD: LIPASE: 39 U/L (ref 11–51)

## 2017-02-19 MED ORDER — MORPHINE SULFATE (PF) 2 MG/ML IV SOLN
2.0000 mg | Freq: Once | INTRAVENOUS | Status: AC
  Administered 2017-02-19: 2 mg via INTRAVENOUS
  Filled 2017-02-19: qty 1

## 2017-02-19 MED ORDER — ACETAMINOPHEN 325 MG PO TABS
650.0000 mg | ORAL_TABLET | Freq: Once | ORAL | Status: AC
  Administered 2017-02-19: 650 mg via ORAL
  Filled 2017-02-19: qty 2

## 2017-02-19 MED ORDER — IOPAMIDOL (ISOVUE-300) INJECTION 61%
30.0000 mL | Freq: Once | INTRAVENOUS | Status: AC
  Administered 2017-02-19: 30 mL via ORAL

## 2017-02-19 MED ORDER — ONDANSETRON HCL 4 MG/2ML IJ SOLN
4.0000 mg | Freq: Once | INTRAMUSCULAR | Status: AC
  Administered 2017-02-19: 4 mg via INTRAVENOUS
  Filled 2017-02-19: qty 2

## 2017-02-19 MED ORDER — IOPAMIDOL (ISOVUE-300) INJECTION 61%
100.0000 mL | Freq: Once | INTRAVENOUS | Status: AC | PRN
  Administered 2017-02-19: 100 mL via INTRAVENOUS

## 2017-02-19 NOTE — ED Notes (Signed)
Pt asking for his daily tylenol for HA and abd pain, states his stomach hurts when he is hungry and will eat once he returns to [employer].. Paced rhythm noted on the monitor, will continue to monitor the pt.

## 2017-02-19 NOTE — ED Triage Notes (Signed)
Pt in with co mid abd pain for few weeks, states tonight he could not sleep. Has seen pmd for the same in the past and the patient states "they want to put a tube down my throat to look". Has not had procedure done yet, has had nausea and 1 episode of diarrhea today.

## 2017-02-19 NOTE — ED Notes (Signed)
Pt went to CT

## 2017-02-19 NOTE — ED Provider Notes (Signed)
[facility] Emergency Department Provider Note   First MD Initiated Contact with Patient 02/19/17 [id number]     (approximate)  I have reviewed the triage vital signs and the nursing notes.   HISTORY  Chief Complaint Abdominal Pain   HPI Danny Grimes is a 83 y.o. male with below list of chronic medical conditions presents to the emergency department generalized abdominal pain which patient states has been going on for "weeks". Patient states he presented to the emergency department tonight secondary to worsening pain when he was trying to sleep. Patient denies any vomiting no diarrhea no fever.   Past Medical History:  Diagnosis Date  . Adenocarcinoma ([place])   . Anxiety   . Bilateral renal masses   . CHF (congestive heart failure) ([place])   . Colon polyp   . Degenerative joint disease   . Depression   . DNR (do not resuscitate)   . Fitting and adjustment of cardiac pacemaker   . Hemiparesis affecting left side as late effect of cerebrovascular accident (CVA) ([place])   . Hyperlipemia   . Hypertension   . Parkinson disease ([place])   . Paroxysmal atrial fibrillation (HCC)   . Peripheral arterial disease ([place])   . Skin cancer     Patient Active Problem List   Diagnosis Date Noted  . 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 CHF (congestive heart failure) ([place]) 06/24/2016  . A-fib ([place]) 06/24/2016  . HTN (hypertension) 06/24/2016  . Hyperlipidemia 06/24/2016    Past Surgical History:  Procedure Laterality Date  . insert / replace/ remove peacemaker      Prior to Admission medications   Medication Sig Start Date End Date Taking? Authorizing Provider  acetaminophen (TYLENOL) 325 MG tablet Take 650 mg by mouth 3 (three) times daily as needed for mild pain.   Yes Historical Provider, MD  apixaban (ELIQUIS) 2.5 MG TABS tablet Take 2.5 mg by mouth 2 (two) times daily.   Yes Historical Provider, MD  aspirin EC 81 MG tablet Take 81 mg by mouth daily.    Yes Historical Provider, MD  atorvastatin (LIPITOR) 40 MG tablet Take 40 mg by mouth daily.   Yes Historical Provider, MD  calcium-vitamin D (OSCAL WITH D) 500-200 MG-UNIT tablet Take 2 tablets by mouth daily with breakfast.   Yes Historical Provider, MD  carbidopa-levodopa (SINEMET IR) 25-100 MG tablet Take 2 tablets by mouth 3 (three) times daily.    Yes Historical Provider, MD  Cholecalciferol 10000 units TABS Take 1,000 Units by mouth daily.    Yes Historical Provider, MD  docusate sodium (COLACE) 100 MG capsule Take 200 mg by mouth 2 (two) times daily as needed for mild constipation.   Yes Historical Provider, MD  DULoxetine (CYMBALTA) 30 MG capsule Take 90 mg by mouth daily.   Yes Historical Provider, MD  finasteride (PROSCAR) 5 MG tablet Take 5 mg by mouth daily.   Yes Historical Provider, MD  folic acid (FOLVITE) 1 MG tablet Take 1 mg by mouth daily.   Yes Historical Provider, MD  furosemide (LASIX) 20 MG tablet Take 20 mg by mouth daily as needed for fluid.   Yes Historical Provider, MD  Melatonin 3 MG TABS Take 6 mg by mouth at bedtime as needed (sleep).   Yes Historical Provider, MD  metoprolol succinate (TOPROL-XL) 25 MG 24 hr tablet Take 25 mg by mouth daily.   Yes Historical Provider, MD  OLANZapine (ZYPREXA) 5 MG tablet Take 2.5 mg (half tablet)  each morning and 5 mg (1 tablet) each night at bedtime. Patient taking differently: Take 2.5 mg by mouth at bedtime.  06/24/16  Yes Carrie Mew, MD  Omega-3 1000 MG CAPS Take 2,000 mg by mouth daily.   Yes Historical Provider, MD  pantoprazole (PROTONIX) 40 MG tablet Take 40 mg by mouth 2 (two) times daily.   Yes Historical Provider, MD  psyllium (HYDROCIL/METAMUCIL) 95 % PACK Take 1 packet by mouth daily.   Yes Historical Provider, MD  senna-docusate (SENOKOT-S) 8.6-50 MG tablet Take 1 tablet by mouth 2 (two) times daily.   Yes Historical Provider, MD  terazosin (HYTRIN) 5 MG capsule Take 5 mg by mouth at bedtime.   Yes Historical  Provider, MD  vitamin B-12 (CYANOCOBALAMIN) 1000 MCG tablet Take 1,000 mcg by mouth daily.   Yes Historical Provider, MD  alfuzosin (UROXATRAL) 10 MG 24 hr tablet Take 10 mg by mouth daily with breakfast.    Historical Provider, MD  phenazopyridine (PYRIDIUM) 200 MG tablet Take 1 tablet (200 mg total) by mouth 3 (three) times daily. Patient not taking: Reported on 02/19/2017 05/29/16   Lorin Picket, PA-C  tamsulosin (FLOMAX) 0.4 MG CAPS capsule Take 1 capsule (0.4 mg total) by mouth every morning. Patient not taking: Reported on 02/19/2017 05/29/16   Lorin Picket, PA-C    Allergies Bee venom  No family history on file.  Social History Social History  Substance Use Topics  . Smoking status: Former Research scientist (life sciences)  . Smokeless tobacco: Not on file  . Alcohol use No    Review of Systems Constitutional: No fever/chills Eyes: No visual changes. ENT: No sore throat. Cardiovascular: Denies chest pain. Respiratory: Denies shortness of breath. Gastrointestinal: Positive for abdominal pain.  No nausea, no vomiting.  No diarrhea.  No constipation. Genitourinary: Negative for dysuria. Musculoskeletal: Negative for back pain. Skin: Negative for rash. Neurological: Negative for headaches, focal weakness or numbness.  10-point ROS otherwise negative.  ____________________________________________   PHYSICAL EXAM:  VITAL SIGNS: ED Triage Vitals  Enc Vitals Group     BP 02/19/17 0224 127/68     Pulse Rate 02/19/17 0224 88     Resp 02/19/17 0224 (!) 22     Temp 02/19/17 0224 98.2 F (36.8 C)     Temp Source 02/19/17 0224 Oral     SpO2 02/19/17 0224 97 %     Weight 02/19/17 0225 186 lb (84.4 kg)     Height 02/19/17 0225 5\' 11"  (1.803 m)     Head Circumference --      Peak Flow --      Pain Score 02/19/17 0225 8     Pain Loc --      Pain Edu? --      Excl. in [place]? --     Constitutional: Alert and oriented. Well appearing and in no acute distress. Eyes: Conjunctivae are normal. PERRL.  EOMI. Head: Atraumatic. Mouth/Throat: Mucous membranes are moist. Oropharynx non-erythematous. Neck: No stridor.  No meningeal signs.  No cervical spine tenderness to palpation. Cardiovascular: Normal rate, regular rhythm. Good peripheral circulation. Grossly normal heart sounds. Respiratory: Normal respiratory effort.  No retractions. Lungs CTAB. Gastrointestinal: Soft and nontender. No distention.  Musculoskeletal: No lower extremity tenderness nor edema. No gross deformities of extremities. Neurologic:  Normal speech and language. No gross focal neurologic deficits are appreciated.  Skin:  Skin is warm, dry and intact. No rash noted. Psychiatric: Mood and affect are normal. Speech and behavior are normal.  ____________________________________________  LABS (all labs ordered are listed, but only abnormal results are displayed)  Labs Reviewed  CBC - Abnormal; Notable for the following:       Result Value   RBC 4.17 (*)    Hemoglobin 12.9 (*)    HCT 37.9 (*)    RDW 15.5 (*)    All other components within normal limits  COMPREHENSIVE METABOLIC PANEL - Abnormal; Notable for the following:    Potassium 3.3 (*)    Glucose, Bld 104 (*)    BUN 25 (*)    ALT 8 (*)    Total Bilirubin 1.4 (*)    All other components within normal limits  URINALYSIS, COMPLETE (UACMP) WITH MICROSCOPIC - Abnormal; Notable for the following:    Color, Urine YELLOW (*)    APPearance CLEAR (*)    Specific Gravity, Urine 1.042 (*)    Ketones, ur 5 (*)    Squamous Epithelial / LPF 0-5 (*)    All other components within normal limits  LIPASE, BLOOD  TROPONIN I   ____________________________________________  EKG  ED ECG REPORT I, Atkins N Beaux Wedemeyer, the attending physician, personally viewed and interpreted this ECG.   Date: 02/19/2017  EKG Time: 2:26 AM  Rate: 87  Rhythm: Atrial sensed ventricular paced rhythm  Axis: Normal  Intervals: Normal  ST&T Change:  None  ____________________________________________  RADIOLOGY I, Hardee N Dalinda Heidt, personally viewed and evaluated these images (plain radiographs) as part of my medical decision making, as well as reviewing the written report by the radiologist.  Ct Abdomen Pelvis W Contrast  Result Date: 02/19/2017 CLINICAL DATA:  Chronic generalized abdominal pain. Diarrhea and constipation. Initial encounter. EXAM: CT ABDOMEN AND PELVIS WITH CONTRAST TECHNIQUE: Multidetector CT imaging of the abdomen and pelvis was performed using the standard protocol following bolus administration of intravenous contrast. CONTRAST:  132mL ISOVUE-300 IOPAMIDOL (ISOVUE-300) INJECTION 61% COMPARISON:  None. FINDINGS: Lower chest: A trace right pleural effusion is noted. Minimal right basilar atelectasis is noted. Diffuse coronary artery calcifications are seen. Pacemaker leads are partially imaged. Calcification is noted at the aortic and mitral valves. The heart is mildly enlarged. Hepatobiliary: There is mild prominence of the intrahepatic biliary ducts, of uncertain significance. The liver is otherwise unremarkable. The gallbladder is unremarkable in appearance. The common bile duct remains normal in caliber. Pancreas: The pancreas is within normal limits. Spleen: The spleen is unremarkable in appearance. Adrenals/Urinary Tract: The adrenal glands are unremarkable in appearance. Numerous bilateral renal cysts are seen, particularly on the left. Postoperative change and scarring is noted at the lower pole of the right kidney. Nonspecific perinephric stranding is noted bilaterally. There is no evidence of hydronephrosis. No renal or ureteral stones are identified. Stomach/Bowel: The stomach is unremarkable in appearance. The small bowel is within normal limits. The appendix is not visualized; there is no evidence for appendicitis. The colon is largely filled with fluid. A small amount of stool is noted in the sigmoid colon. There is  mild sigmoid colon wall thickening, raising question for a mild infectious or inflammatory process. Vascular/Lymphatic: Scattered calcification is seen along the abdominal aorta and its branches. There is mild ectasia of the infrarenal abdominal aorta. The inferior vena cava is grossly unremarkable. No retroperitoneal lymphadenopathy is seen. No pelvic sidewall lymphadenopathy is identified. Reproductive: The bladder is mildly distended. There is impression on the base of the bladder by the prostate. The prostate measures 5.6 cm in diameter, with mild heterogeneity. Other: No additional soft tissue abnormalities are seen. Musculoskeletal: No  acute osseous abnormalities are identified. There is chronic loss of height at vertebral body L1, with partial osseous fusion at T12-L1. Vacuum phenomenon is noted at the lower lumbar spine. The visualized musculature is unremarkable in appearance. IMPRESSION: 1. Mild thickening of the wall of the sigmoid colon, raising question for a mild infectious or inflammatory process. Small amount of stool within the sigmoid colon. The colon is otherwise largely filled with fluid. 2. Trace right pleural effusion, with minimal right basilar atelectasis. 3. Numerous bilateral renal cysts, particularly on the left. Postoperative change and scarring at the lower pole of the right kidney. 4. Diffuse coronary artery calcifications seen. Calcification at the aortic and mitral valves. Mild cardiomegaly. 5. Scattered aortic atherosclerosis. Mild ectasia of the infrarenal abdominal aorta. 6. Enlarged prostate, with mild heterogeneity. Impression on the base of the bladder by the prostate. Would correlate with PSA. 7. Chronic loss of height at vertebral body L1, with partial osseous fusion at T12-L1. 8. Electronically Signed   By: Garald Balding M.D.   On: 02/19/2017 05:07      Procedures    INITIAL IMPRESSION / ASSESSMENT AND PLAN / ED COURSE  Pertinent labs & imaging results that were  available during my care of the patient were reviewed by me and considered in my medical decision making (see chart for details).        ____________________________________________  FINAL CLINICAL IMPRESSION(S) / ED DIAGNOSES  Final diagnoses:  Epigastric pain     MEDICATIONS GIVEN DURING THIS VISIT:  Medications  morphine 2 MG/ML injection 2 mg (2 mg Intravenous Given 02/19/17 0320)  ondansetron (ZOFRAN) injection 4 mg (4 mg Intravenous Given 02/19/17 0320)  iopamidol (ISOVUE-300) 61 % injection 30 mL (30 mLs Oral Contrast Given 02/19/17 0351)  iopamidol (ISOVUE-300) 61 % injection 100 mL (100 mLs Intravenous Contrast Given 02/19/17 0446)  acetaminophen (TYLENOL) tablet 650 mg (650 mg Oral Given 02/19/17 0805)     NEW OUTPATIENT MEDICATIONS STARTED DURING THIS VISIT:  Discharge Medication List as of 02/19/2017  5:49 AM      Discharge Medication List as of 02/19/2017  5:49 AM      Discharge Medication List as of 02/19/2017  5:49 AM       Note:  This document was prepared using Dragon voice recognition software and may include unintentional dictation errors.    Gregor Hams, MD 02/20/17 [phone]

## 2017-02-22 DIAGNOSIS — G629 Polyneuropathy, unspecified: Secondary | ICD-10-CM | POA: Diagnosis not present

## 2017-02-22 DIAGNOSIS — F0280 Dementia in other diseases classified elsewhere without behavioral disturbance: Secondary | ICD-10-CM | POA: Diagnosis not present

## 2017-02-22 DIAGNOSIS — I2510 Atherosclerotic heart disease of native coronary artery without angina pectoris: Secondary | ICD-10-CM | POA: Diagnosis not present

## 2017-02-22 DIAGNOSIS — G20 Parkinson's disease: Secondary | ICD-10-CM | POA: Diagnosis not present

## 2017-02-22 DIAGNOSIS — I4891 Unspecified atrial fibrillation: Secondary | ICD-10-CM | POA: Diagnosis not present

## 2017-02-22 DIAGNOSIS — M5416 Radiculopathy, lumbar region: Secondary | ICD-10-CM | POA: Diagnosis not present

## 2017-02-26 DIAGNOSIS — M5416 Radiculopathy, lumbar region: Secondary | ICD-10-CM | POA: Diagnosis not present

## 2017-02-26 DIAGNOSIS — F0280 Dementia in other diseases classified elsewhere without behavioral disturbance: Secondary | ICD-10-CM | POA: Diagnosis not present

## 2017-02-26 DIAGNOSIS — G629 Polyneuropathy, unspecified: Secondary | ICD-10-CM | POA: Diagnosis not present

## 2017-02-26 DIAGNOSIS — G20 Parkinson's disease: Secondary | ICD-10-CM | POA: Diagnosis not present

## 2017-02-26 DIAGNOSIS — I2510 Atherosclerotic heart disease of native coronary artery without angina pectoris: Secondary | ICD-10-CM | POA: Diagnosis not present

## 2017-02-26 DIAGNOSIS — I4891 Unspecified atrial fibrillation: Secondary | ICD-10-CM | POA: Diagnosis not present

## 2017-02-28 DIAGNOSIS — F0280 Dementia in other diseases classified elsewhere without behavioral disturbance: Secondary | ICD-10-CM | POA: Diagnosis not present

## 2017-02-28 DIAGNOSIS — I4891 Unspecified atrial fibrillation: Secondary | ICD-10-CM | POA: Diagnosis not present

## 2017-02-28 DIAGNOSIS — G629 Polyneuropathy, unspecified: Secondary | ICD-10-CM | POA: Diagnosis not present

## 2017-02-28 DIAGNOSIS — G20 Parkinson's disease: Secondary | ICD-10-CM | POA: Diagnosis not present

## 2017-02-28 DIAGNOSIS — I2510 Atherosclerotic heart disease of native coronary artery without angina pectoris: Secondary | ICD-10-CM | POA: Diagnosis not present

## 2017-02-28 DIAGNOSIS — M5416 Radiculopathy, lumbar region: Secondary | ICD-10-CM | POA: Diagnosis not present

## 2017-03-08 DIAGNOSIS — G629 Polyneuropathy, unspecified: Secondary | ICD-10-CM | POA: Diagnosis not present

## 2017-03-08 DIAGNOSIS — F0280 Dementia in other diseases classified elsewhere without behavioral disturbance: Secondary | ICD-10-CM | POA: Diagnosis not present

## 2017-03-08 DIAGNOSIS — I2510 Atherosclerotic heart disease of native coronary artery without angina pectoris: Secondary | ICD-10-CM | POA: Diagnosis not present

## 2017-03-08 DIAGNOSIS — M5416 Radiculopathy, lumbar region: Secondary | ICD-10-CM | POA: Diagnosis not present

## 2017-03-08 DIAGNOSIS — I4891 Unspecified atrial fibrillation: Secondary | ICD-10-CM | POA: Diagnosis not present

## 2017-03-08 DIAGNOSIS — G20 Parkinson's disease: Secondary | ICD-10-CM | POA: Diagnosis not present

## 2017-03-11 DIAGNOSIS — F0280 Dementia in other diseases classified elsewhere without behavioral disturbance: Secondary | ICD-10-CM | POA: Diagnosis not present

## 2017-03-11 DIAGNOSIS — M5416 Radiculopathy, lumbar region: Secondary | ICD-10-CM | POA: Diagnosis not present

## 2017-03-11 DIAGNOSIS — I4891 Unspecified atrial fibrillation: Secondary | ICD-10-CM | POA: Diagnosis not present

## 2017-03-11 DIAGNOSIS — I2510 Atherosclerotic heart disease of native coronary artery without angina pectoris: Secondary | ICD-10-CM | POA: Diagnosis not present

## 2017-03-11 DIAGNOSIS — G629 Polyneuropathy, unspecified: Secondary | ICD-10-CM | POA: Diagnosis not present

## 2017-03-11 DIAGNOSIS — G20 Parkinson's disease: Secondary | ICD-10-CM | POA: Diagnosis not present

## 2017-03-14 DIAGNOSIS — I2510 Atherosclerotic heart disease of native coronary artery without angina pectoris: Secondary | ICD-10-CM | POA: Diagnosis not present

## 2017-03-14 DIAGNOSIS — M5416 Radiculopathy, lumbar region: Secondary | ICD-10-CM | POA: Diagnosis not present

## 2017-03-14 DIAGNOSIS — G20 Parkinson's disease: Secondary | ICD-10-CM | POA: Diagnosis not present

## 2017-03-14 DIAGNOSIS — F0280 Dementia in other diseases classified elsewhere without behavioral disturbance: Secondary | ICD-10-CM | POA: Diagnosis not present

## 2017-03-14 DIAGNOSIS — G629 Polyneuropathy, unspecified: Secondary | ICD-10-CM | POA: Diagnosis not present

## 2017-03-14 DIAGNOSIS — I4891 Unspecified atrial fibrillation: Secondary | ICD-10-CM | POA: Diagnosis not present

## 2017-03-18 DIAGNOSIS — G629 Polyneuropathy, unspecified: Secondary | ICD-10-CM | POA: Diagnosis not present

## 2017-03-18 DIAGNOSIS — F0280 Dementia in other diseases classified elsewhere without behavioral disturbance: Secondary | ICD-10-CM | POA: Diagnosis not present

## 2017-03-18 DIAGNOSIS — M5416 Radiculopathy, lumbar region: Secondary | ICD-10-CM | POA: Diagnosis not present

## 2017-03-18 DIAGNOSIS — I2510 Atherosclerotic heart disease of native coronary artery without angina pectoris: Secondary | ICD-10-CM | POA: Diagnosis not present

## 2017-03-18 DIAGNOSIS — I4891 Unspecified atrial fibrillation: Secondary | ICD-10-CM | POA: Diagnosis not present

## 2017-03-18 DIAGNOSIS — G20 Parkinson's disease: Secondary | ICD-10-CM | POA: Diagnosis not present

## 2017-04-01 DIAGNOSIS — G629 Polyneuropathy, unspecified: Secondary | ICD-10-CM | POA: Diagnosis not present

## 2017-04-01 DIAGNOSIS — I2510 Atherosclerotic heart disease of native coronary artery without angina pectoris: Secondary | ICD-10-CM | POA: Diagnosis not present

## 2017-04-01 DIAGNOSIS — L89311 Pressure ulcer of right buttock, stage 1: Secondary | ICD-10-CM | POA: Diagnosis not present

## 2017-04-01 DIAGNOSIS — F0280 Dementia in other diseases classified elsewhere without behavioral disturbance: Secondary | ICD-10-CM | POA: Diagnosis not present

## 2017-04-01 DIAGNOSIS — E785 Hyperlipidemia, unspecified: Secondary | ICD-10-CM | POA: Diagnosis not present

## 2017-04-01 DIAGNOSIS — Z7901 Long term (current) use of anticoagulants: Secondary | ICD-10-CM | POA: Diagnosis not present

## 2017-04-01 DIAGNOSIS — F411 Generalized anxiety disorder: Secondary | ICD-10-CM | POA: Diagnosis not present

## 2017-04-01 DIAGNOSIS — I4891 Unspecified atrial fibrillation: Secondary | ICD-10-CM | POA: Diagnosis not present

## 2017-04-01 DIAGNOSIS — G20 Parkinson's disease: Secondary | ICD-10-CM | POA: Diagnosis not present

## 2017-04-01 DIAGNOSIS — K210 Gastro-esophageal reflux disease with esophagitis: Secondary | ICD-10-CM | POA: Diagnosis not present

## 2017-04-01 DIAGNOSIS — F329 Major depressive disorder, single episode, unspecified: Secondary | ICD-10-CM | POA: Diagnosis not present

## 2017-04-01 DIAGNOSIS — M5416 Radiculopathy, lumbar region: Secondary | ICD-10-CM | POA: Diagnosis not present

## 2017-04-03 DIAGNOSIS — L89311 Pressure ulcer of right buttock, stage 1: Secondary | ICD-10-CM | POA: Diagnosis not present

## 2017-04-03 DIAGNOSIS — I2510 Atherosclerotic heart disease of native coronary artery without angina pectoris: Secondary | ICD-10-CM | POA: Diagnosis not present

## 2017-04-03 DIAGNOSIS — G20 Parkinson's disease: Secondary | ICD-10-CM | POA: Diagnosis not present

## 2017-04-03 DIAGNOSIS — G629 Polyneuropathy, unspecified: Secondary | ICD-10-CM | POA: Diagnosis not present

## 2017-04-03 DIAGNOSIS — I4891 Unspecified atrial fibrillation: Secondary | ICD-10-CM | POA: Diagnosis not present

## 2017-04-03 DIAGNOSIS — F0280 Dementia in other diseases classified elsewhere without behavioral disturbance: Secondary | ICD-10-CM | POA: Diagnosis not present

## 2017-04-04 DIAGNOSIS — G629 Polyneuropathy, unspecified: Secondary | ICD-10-CM | POA: Diagnosis not present

## 2017-04-04 DIAGNOSIS — I2510 Atherosclerotic heart disease of native coronary artery without angina pectoris: Secondary | ICD-10-CM | POA: Diagnosis not present

## 2017-04-04 DIAGNOSIS — I4891 Unspecified atrial fibrillation: Secondary | ICD-10-CM | POA: Diagnosis not present

## 2017-04-04 DIAGNOSIS — L89311 Pressure ulcer of right buttock, stage 1: Secondary | ICD-10-CM | POA: Diagnosis not present

## 2017-04-04 DIAGNOSIS — F0280 Dementia in other diseases classified elsewhere without behavioral disturbance: Secondary | ICD-10-CM | POA: Diagnosis not present

## 2017-04-04 DIAGNOSIS — G20 Parkinson's disease: Secondary | ICD-10-CM | POA: Diagnosis not present

## 2017-04-08 DIAGNOSIS — I4891 Unspecified atrial fibrillation: Secondary | ICD-10-CM | POA: Diagnosis not present

## 2017-04-08 DIAGNOSIS — L89311 Pressure ulcer of right buttock, stage 1: Secondary | ICD-10-CM | POA: Diagnosis not present

## 2017-04-08 DIAGNOSIS — F0280 Dementia in other diseases classified elsewhere without behavioral disturbance: Secondary | ICD-10-CM | POA: Diagnosis not present

## 2017-04-08 DIAGNOSIS — I2510 Atherosclerotic heart disease of native coronary artery without angina pectoris: Secondary | ICD-10-CM | POA: Diagnosis not present

## 2017-04-08 DIAGNOSIS — G20 Parkinson's disease: Secondary | ICD-10-CM | POA: Diagnosis not present

## 2017-04-08 DIAGNOSIS — G629 Polyneuropathy, unspecified: Secondary | ICD-10-CM | POA: Diagnosis not present

## 2017-04-09 DIAGNOSIS — G629 Polyneuropathy, unspecified: Secondary | ICD-10-CM | POA: Diagnosis not present

## 2017-04-09 DIAGNOSIS — I2510 Atherosclerotic heart disease of native coronary artery without angina pectoris: Secondary | ICD-10-CM | POA: Diagnosis not present

## 2017-04-09 DIAGNOSIS — L89311 Pressure ulcer of right buttock, stage 1: Secondary | ICD-10-CM | POA: Diagnosis not present

## 2017-04-09 DIAGNOSIS — F0280 Dementia in other diseases classified elsewhere without behavioral disturbance: Secondary | ICD-10-CM | POA: Diagnosis not present

## 2017-04-09 DIAGNOSIS — I4891 Unspecified atrial fibrillation: Secondary | ICD-10-CM | POA: Diagnosis not present

## 2017-04-09 DIAGNOSIS — G20 Parkinson's disease: Secondary | ICD-10-CM | POA: Diagnosis not present

## 2017-04-11 DIAGNOSIS — L89311 Pressure ulcer of right buttock, stage 1: Secondary | ICD-10-CM | POA: Diagnosis not present

## 2017-04-11 DIAGNOSIS — I2510 Atherosclerotic heart disease of native coronary artery without angina pectoris: Secondary | ICD-10-CM | POA: Diagnosis not present

## 2017-04-11 DIAGNOSIS — G629 Polyneuropathy, unspecified: Secondary | ICD-10-CM | POA: Diagnosis not present

## 2017-04-11 DIAGNOSIS — F0280 Dementia in other diseases classified elsewhere without behavioral disturbance: Secondary | ICD-10-CM | POA: Diagnosis not present

## 2017-04-11 DIAGNOSIS — G20 Parkinson's disease: Secondary | ICD-10-CM | POA: Diagnosis not present

## 2017-04-11 DIAGNOSIS — I4891 Unspecified atrial fibrillation: Secondary | ICD-10-CM | POA: Diagnosis not present

## 2017-04-12 DIAGNOSIS — I4891 Unspecified atrial fibrillation: Secondary | ICD-10-CM | POA: Diagnosis not present

## 2017-04-12 DIAGNOSIS — F0280 Dementia in other diseases classified elsewhere without behavioral disturbance: Secondary | ICD-10-CM | POA: Diagnosis not present

## 2017-04-12 DIAGNOSIS — G629 Polyneuropathy, unspecified: Secondary | ICD-10-CM | POA: Diagnosis not present

## 2017-04-12 DIAGNOSIS — L89311 Pressure ulcer of right buttock, stage 1: Secondary | ICD-10-CM | POA: Diagnosis not present

## 2017-04-12 DIAGNOSIS — G20 Parkinson's disease: Secondary | ICD-10-CM | POA: Diagnosis not present

## 2017-04-12 DIAGNOSIS — I2510 Atherosclerotic heart disease of native coronary artery without angina pectoris: Secondary | ICD-10-CM | POA: Diagnosis not present

## 2017-04-16 DIAGNOSIS — G20 Parkinson's disease: Secondary | ICD-10-CM | POA: Diagnosis not present

## 2017-04-16 DIAGNOSIS — F0280 Dementia in other diseases classified elsewhere without behavioral disturbance: Secondary | ICD-10-CM | POA: Diagnosis not present

## 2017-04-16 DIAGNOSIS — I2510 Atherosclerotic heart disease of native coronary artery without angina pectoris: Secondary | ICD-10-CM | POA: Diagnosis not present

## 2017-04-16 DIAGNOSIS — L89311 Pressure ulcer of right buttock, stage 1: Secondary | ICD-10-CM | POA: Diagnosis not present

## 2017-04-16 DIAGNOSIS — I4891 Unspecified atrial fibrillation: Secondary | ICD-10-CM | POA: Diagnosis not present

## 2017-04-16 DIAGNOSIS — G629 Polyneuropathy, unspecified: Secondary | ICD-10-CM | POA: Diagnosis not present

## 2017-04-17 DIAGNOSIS — I2510 Atherosclerotic heart disease of native coronary artery without angina pectoris: Secondary | ICD-10-CM | POA: Diagnosis not present

## 2017-04-17 DIAGNOSIS — I4891 Unspecified atrial fibrillation: Secondary | ICD-10-CM | POA: Diagnosis not present

## 2017-04-17 DIAGNOSIS — F0280 Dementia in other diseases classified elsewhere without behavioral disturbance: Secondary | ICD-10-CM | POA: Diagnosis not present

## 2017-04-17 DIAGNOSIS — L89311 Pressure ulcer of right buttock, stage 1: Secondary | ICD-10-CM | POA: Diagnosis not present

## 2017-04-17 DIAGNOSIS — G20 Parkinson's disease: Secondary | ICD-10-CM | POA: Diagnosis not present

## 2017-04-17 DIAGNOSIS — G629 Polyneuropathy, unspecified: Secondary | ICD-10-CM | POA: Diagnosis not present

## 2017-04-23 DIAGNOSIS — F0280 Dementia in other diseases classified elsewhere without behavioral disturbance: Secondary | ICD-10-CM | POA: Diagnosis not present

## 2017-04-23 DIAGNOSIS — I2510 Atherosclerotic heart disease of native coronary artery without angina pectoris: Secondary | ICD-10-CM | POA: Diagnosis not present

## 2017-04-23 DIAGNOSIS — I4891 Unspecified atrial fibrillation: Secondary | ICD-10-CM | POA: Diagnosis not present

## 2017-04-23 DIAGNOSIS — L89311 Pressure ulcer of right buttock, stage 1: Secondary | ICD-10-CM | POA: Diagnosis not present

## 2017-04-23 DIAGNOSIS — G629 Polyneuropathy, unspecified: Secondary | ICD-10-CM | POA: Diagnosis not present

## 2017-04-23 DIAGNOSIS — G20 Parkinson's disease: Secondary | ICD-10-CM | POA: Diagnosis not present

## 2017-05-19 DIAGNOSIS — L570 Actinic keratosis: Secondary | ICD-10-CM | POA: Diagnosis not present

## 2017-05-19 DIAGNOSIS — L89212 Pressure ulcer of right hip, stage 2: Secondary | ICD-10-CM | POA: Diagnosis not present

## 2017-05-19 DIAGNOSIS — E785 Hyperlipidemia, unspecified: Secondary | ICD-10-CM | POA: Diagnosis not present

## 2017-05-19 DIAGNOSIS — F0280 Dementia in other diseases classified elsewhere without behavioral disturbance: Secondary | ICD-10-CM | POA: Diagnosis not present

## 2017-05-19 DIAGNOSIS — S50312D Abrasion of left elbow, subsequent encounter: Secondary | ICD-10-CM | POA: Diagnosis not present

## 2017-05-19 DIAGNOSIS — M5416 Radiculopathy, lumbar region: Secondary | ICD-10-CM | POA: Diagnosis not present

## 2017-05-19 DIAGNOSIS — F411 Generalized anxiety disorder: Secondary | ICD-10-CM | POA: Diagnosis not present

## 2017-05-19 DIAGNOSIS — K210 Gastro-esophageal reflux disease with esophagitis: Secondary | ICD-10-CM | POA: Diagnosis not present

## 2017-05-19 DIAGNOSIS — G20 Parkinson's disease: Secondary | ICD-10-CM | POA: Diagnosis not present

## 2017-05-19 DIAGNOSIS — I2510 Atherosclerotic heart disease of native coronary artery without angina pectoris: Secondary | ICD-10-CM | POA: Diagnosis not present

## 2017-05-19 DIAGNOSIS — F329 Major depressive disorder, single episode, unspecified: Secondary | ICD-10-CM | POA: Diagnosis not present

## 2017-05-19 DIAGNOSIS — Z7901 Long term (current) use of anticoagulants: Secondary | ICD-10-CM | POA: Diagnosis not present

## 2017-05-19 DIAGNOSIS — Z87891 Personal history of nicotine dependence: Secondary | ICD-10-CM | POA: Diagnosis not present

## 2017-05-19 DIAGNOSIS — Z85828 Personal history of other malignant neoplasm of skin: Secondary | ICD-10-CM | POA: Diagnosis not present

## 2017-05-19 DIAGNOSIS — I4891 Unspecified atrial fibrillation: Secondary | ICD-10-CM | POA: Diagnosis not present

## 2017-05-19 DIAGNOSIS — G629 Polyneuropathy, unspecified: Secondary | ICD-10-CM | POA: Diagnosis not present

## 2017-05-22 DIAGNOSIS — G20 Parkinson's disease: Secondary | ICD-10-CM | POA: Diagnosis not present

## 2017-05-22 DIAGNOSIS — M5416 Radiculopathy, lumbar region: Secondary | ICD-10-CM | POA: Diagnosis not present

## 2017-05-22 DIAGNOSIS — L89212 Pressure ulcer of right hip, stage 2: Secondary | ICD-10-CM | POA: Diagnosis not present

## 2017-05-22 DIAGNOSIS — L570 Actinic keratosis: Secondary | ICD-10-CM | POA: Diagnosis not present

## 2017-05-22 DIAGNOSIS — F329 Major depressive disorder, single episode, unspecified: Secondary | ICD-10-CM | POA: Diagnosis not present

## 2017-05-22 DIAGNOSIS — S50312D Abrasion of left elbow, subsequent encounter: Secondary | ICD-10-CM | POA: Diagnosis not present

## 2017-05-28 DIAGNOSIS — F329 Major depressive disorder, single episode, unspecified: Secondary | ICD-10-CM | POA: Diagnosis not present

## 2017-05-28 DIAGNOSIS — S50312D Abrasion of left elbow, subsequent encounter: Secondary | ICD-10-CM | POA: Diagnosis not present

## 2017-05-28 DIAGNOSIS — M5416 Radiculopathy, lumbar region: Secondary | ICD-10-CM | POA: Diagnosis not present

## 2017-05-28 DIAGNOSIS — L570 Actinic keratosis: Secondary | ICD-10-CM | POA: Diagnosis not present

## 2017-05-28 DIAGNOSIS — L89212 Pressure ulcer of right hip, stage 2: Secondary | ICD-10-CM | POA: Diagnosis not present

## 2017-05-28 DIAGNOSIS — G20 Parkinson's disease: Secondary | ICD-10-CM | POA: Diagnosis not present

## 2017-06-04 DIAGNOSIS — L570 Actinic keratosis: Secondary | ICD-10-CM | POA: Diagnosis not present

## 2017-06-04 DIAGNOSIS — L89212 Pressure ulcer of right hip, stage 2: Secondary | ICD-10-CM | POA: Diagnosis not present

## 2017-06-04 DIAGNOSIS — S50312D Abrasion of left elbow, subsequent encounter: Secondary | ICD-10-CM | POA: Diagnosis not present

## 2017-06-04 DIAGNOSIS — M5416 Radiculopathy, lumbar region: Secondary | ICD-10-CM | POA: Diagnosis not present

## 2017-06-04 DIAGNOSIS — G20 Parkinson's disease: Secondary | ICD-10-CM | POA: Diagnosis not present

## 2017-06-04 DIAGNOSIS — F329 Major depressive disorder, single episode, unspecified: Secondary | ICD-10-CM | POA: Diagnosis not present

## 2017-06-10 DIAGNOSIS — F329 Major depressive disorder, single episode, unspecified: Secondary | ICD-10-CM | POA: Diagnosis not present

## 2017-06-10 DIAGNOSIS — M5416 Radiculopathy, lumbar region: Secondary | ICD-10-CM | POA: Diagnosis not present

## 2017-06-10 DIAGNOSIS — S50312D Abrasion of left elbow, subsequent encounter: Secondary | ICD-10-CM | POA: Diagnosis not present

## 2017-06-10 DIAGNOSIS — L570 Actinic keratosis: Secondary | ICD-10-CM | POA: Diagnosis not present

## 2017-06-10 DIAGNOSIS — L89212 Pressure ulcer of right hip, stage 2: Secondary | ICD-10-CM | POA: Diagnosis not present

## 2017-06-10 DIAGNOSIS — G20 Parkinson's disease: Secondary | ICD-10-CM | POA: Diagnosis not present

## 2017-06-17 DIAGNOSIS — M5416 Radiculopathy, lumbar region: Secondary | ICD-10-CM | POA: Diagnosis not present

## 2017-06-17 DIAGNOSIS — L570 Actinic keratosis: Secondary | ICD-10-CM | POA: Diagnosis not present

## 2017-06-17 DIAGNOSIS — F329 Major depressive disorder, single episode, unspecified: Secondary | ICD-10-CM | POA: Diagnosis not present

## 2017-06-17 DIAGNOSIS — L89212 Pressure ulcer of right hip, stage 2: Secondary | ICD-10-CM | POA: Diagnosis not present

## 2017-06-17 DIAGNOSIS — G20 Parkinson's disease: Secondary | ICD-10-CM | POA: Diagnosis not present

## 2017-06-17 DIAGNOSIS — S50312D Abrasion of left elbow, subsequent encounter: Secondary | ICD-10-CM | POA: Diagnosis not present

## 2017-06-24 DIAGNOSIS — S50312D Abrasion of left elbow, subsequent encounter: Secondary | ICD-10-CM | POA: Diagnosis not present

## 2017-06-24 DIAGNOSIS — L570 Actinic keratosis: Secondary | ICD-10-CM | POA: Diagnosis not present

## 2017-06-24 DIAGNOSIS — F329 Major depressive disorder, single episode, unspecified: Secondary | ICD-10-CM | POA: Diagnosis not present

## 2017-06-24 DIAGNOSIS — L89212 Pressure ulcer of right hip, stage 2: Secondary | ICD-10-CM | POA: Diagnosis not present

## 2017-06-24 DIAGNOSIS — M5416 Radiculopathy, lumbar region: Secondary | ICD-10-CM | POA: Diagnosis not present

## 2017-06-24 DIAGNOSIS — G20 Parkinson's disease: Secondary | ICD-10-CM | POA: Diagnosis not present

## 2017-07-02 DIAGNOSIS — S50312D Abrasion of left elbow, subsequent encounter: Secondary | ICD-10-CM | POA: Diagnosis not present

## 2017-07-02 DIAGNOSIS — M5416 Radiculopathy, lumbar region: Secondary | ICD-10-CM | POA: Diagnosis not present

## 2017-07-02 DIAGNOSIS — F329 Major depressive disorder, single episode, unspecified: Secondary | ICD-10-CM | POA: Diagnosis not present

## 2017-07-02 DIAGNOSIS — G20 Parkinson's disease: Secondary | ICD-10-CM | POA: Diagnosis not present

## 2017-07-02 DIAGNOSIS — L570 Actinic keratosis: Secondary | ICD-10-CM | POA: Diagnosis not present

## 2017-07-02 DIAGNOSIS — L89212 Pressure ulcer of right hip, stage 2: Secondary | ICD-10-CM | POA: Diagnosis not present

## 2017-07-09 DIAGNOSIS — G20 Parkinson's disease: Secondary | ICD-10-CM | POA: Diagnosis not present

## 2017-07-09 DIAGNOSIS — L570 Actinic keratosis: Secondary | ICD-10-CM | POA: Diagnosis not present

## 2017-07-09 DIAGNOSIS — M5416 Radiculopathy, lumbar region: Secondary | ICD-10-CM | POA: Diagnosis not present

## 2017-07-09 DIAGNOSIS — F329 Major depressive disorder, single episode, unspecified: Secondary | ICD-10-CM | POA: Diagnosis not present

## 2017-07-09 DIAGNOSIS — S50312D Abrasion of left elbow, subsequent encounter: Secondary | ICD-10-CM | POA: Diagnosis not present

## 2017-07-09 DIAGNOSIS — L89212 Pressure ulcer of right hip, stage 2: Secondary | ICD-10-CM | POA: Diagnosis not present

## 2017-08-21 DIAGNOSIS — H25013 Cortical age-related cataract, bilateral: Secondary | ICD-10-CM | POA: Diagnosis not present

## 2017-10-23 ENCOUNTER — Encounter: Payer: Self-pay | Admitting: Intensive Care

## 2017-10-23 ENCOUNTER — Emergency Department
Admission: EM | Admit: 2017-10-23 | Discharge: 2017-10-23 | Disposition: A | Discharge status: Home / Self Care | Payer: Medicare Other | Attending: Student in an Organized Health Care Education/Training Program | Admitting: Student in an Organized Health Care Education/Training Program

## 2017-10-23 ENCOUNTER — Emergency Department: Payer: Medicare Other

## 2017-10-23 DIAGNOSIS — I110 Hypertensive heart disease with heart failure: Secondary | ICD-10-CM | POA: Insufficient documentation

## 2017-10-23 DIAGNOSIS — Z7901 Long term (current) use of anticoagulants: Secondary | ICD-10-CM | POA: Diagnosis not present

## 2017-10-23 DIAGNOSIS — I509 Heart failure, unspecified: Secondary | ICD-10-CM | POA: Insufficient documentation

## 2017-10-23 DIAGNOSIS — E860 Dehydration: Secondary | ICD-10-CM | POA: Insufficient documentation

## 2017-10-23 DIAGNOSIS — R4020 Unspecified coma: Secondary | ICD-10-CM | POA: Diagnosis not present

## 2017-10-23 DIAGNOSIS — Z87891 Personal history of nicotine dependence: Secondary | ICD-10-CM | POA: Diagnosis not present

## 2017-10-23 DIAGNOSIS — R278 Other lack of coordination: Secondary | ICD-10-CM | POA: Diagnosis not present

## 2017-10-23 DIAGNOSIS — R55 Syncope and collapse: Secondary | ICD-10-CM | POA: Diagnosis not present

## 2017-10-23 DIAGNOSIS — Z7401 Bed confinement status: Secondary | ICD-10-CM | POA: Diagnosis not present

## 2017-10-23 DIAGNOSIS — G20 Parkinson's disease: Secondary | ICD-10-CM | POA: Insufficient documentation

## 2017-10-23 DIAGNOSIS — Z79899 Other long term (current) drug therapy: Secondary | ICD-10-CM | POA: Insufficient documentation

## 2017-10-23 DIAGNOSIS — F0390 Unspecified dementia without behavioral disturbance: Secondary | ICD-10-CM | POA: Insufficient documentation

## 2017-10-23 DIAGNOSIS — R531 Weakness: Secondary | ICD-10-CM | POA: Diagnosis not present

## 2017-10-23 DIAGNOSIS — Z7982 Long term (current) use of aspirin: Secondary | ICD-10-CM | POA: Insufficient documentation

## 2017-10-23 DIAGNOSIS — Z9181 History of falling: Secondary | ICD-10-CM | POA: Diagnosis not present

## 2017-10-23 DIAGNOSIS — R4182 Altered mental status, unspecified: Secondary | ICD-10-CM | POA: Diagnosis not present

## 2017-10-23 LAB — BASIC METABOLIC PANEL
ANION GAP: 7 (ref 5–15)
BUN: 22 mg/dL — AB (ref 6–20)
CHLORIDE: 104 mmol/L (ref 101–111)
CO2: 27 mmol/L (ref 22–32)
Calcium: 9 mg/dL (ref 8.9–10.3)
Creatinine, Ser: 1.3 mg/dL — ABNORMAL HIGH (ref 0.61–1.24)
GFR calc Af Amer: 55 mL/min — ABNORMAL LOW (ref >60)
GFR calc non Af Amer: 48 mL/min — ABNORMAL LOW (ref >60)
GLUCOSE: 130 mg/dL — AB (ref 65–99)
POTASSIUM: 4.5 mmol/L (ref 3.5–5.1)
Sodium: 138 mmol/L (ref 135–145)

## 2017-10-23 LAB — CBC WITH DIFFERENTIAL/PLATELET
Basophils Absolute: 0 10*3/uL (ref 0–0.1)
Basophils Relative: 1 %
EOS PCT: 2 %
Eosinophils Absolute: 0.1 10*3/uL (ref 0–0.7)
HEMATOCRIT: 36.1 % — AB (ref 40.0–52.0)
Hemoglobin: 11.9 g/dL — ABNORMAL LOW (ref 13.0–18.0)
LYMPHS ABS: 1.3 10*3/uL (ref 1.0–3.6)
LYMPHS PCT: 28 %
MCH: 29.6 pg (ref 26.0–34.0)
MCHC: 33 g/dL (ref 32.0–36.0)
MCV: 89.9 fL (ref 80.0–100.0)
MONO ABS: 0.5 10*3/uL (ref 0.2–1.0)
Monocytes Relative: 10 %
NEUTROS ABS: 2.6 10*3/uL (ref 1.4–6.5)
Neutrophils Relative %: 59 %
PLATELETS: 148 10*3/uL — AB (ref 150–440)
RBC: 4.01 MIL/uL — ABNORMAL LOW (ref 4.40–5.90)
RDW: 15.1 % — AB (ref 11.5–14.5)
WBC: 4.4 10*3/uL (ref 3.8–10.6)

## 2017-10-23 LAB — TROPONIN I: Troponin I: <0.03 ng/mL (ref <0.03)

## 2017-10-23 MED ORDER — SODIUM CHLORIDE 0.9 % IV BOLUS (SEPSIS)
500.0000 mL | Freq: Once | INTRAVENOUS | Status: AC
  Administered 2017-10-23: 500 mL via INTRAVENOUS

## 2017-10-23 NOTE — ED Notes (Signed)
Pt unable to sign e-signature due to signautre pad failure. Pt in NAD at time of trasnfer to nursing facility, VS stable.

## 2017-10-23 NOTE — ED Notes (Signed)
Pt offered sandwich tray at this time but denies

## 2017-10-23 NOTE — ED Triage Notes (Signed)
Patient arrived from [facility] by EMS. Patient c/o feeling "swimminess in the head and felt weak" EMS vitals 143/74 b/p, HR 82, 135 blood sugar, 20RR, 98.6oral HX stroke, pacemaker placement

## 2017-10-23 NOTE — ED Notes (Addendum)
Assisted patient with urinal.

## 2017-10-23 NOTE — ED Provider Notes (Signed)
[facility] Emergency Department Provider Note    None    (approximate)  I have reviewed the triage vital signs and the nursing notes.   HISTORY  Chief Complaint Near Syncope    HPI Danny Grimes is a 83 y.o. male with below listed chronic medical issues as well as a history of Parkinson disease and left-sided deficit CVA presents with chief complaint of weakness and lightheadedness when the patient was walking at his facility.  Patient states that he was feeling "swimmy headed" but when asked to further let elaborate patient states that he was feeling weak and lightheaded like he needed to sit down.  He denies any numbness or tingling.  No chest pain.  No shortness of breath.  No changes in medications.  Denies any pain or discomfort at this time.  Past Medical History:  Diagnosis Date  . Adenocarcinoma ([place])   . Anxiety   . Bilateral renal masses   . CHF (congestive heart failure) ([place])   . Colon polyp   . Degenerative joint disease   . Depression   . DNR (do not resuscitate)   . Fitting and adjustment of cardiac pacemaker   . Hemiparesis affecting left side as late effect of cerebrovascular accident (CVA) ([place])   . Hyperlipemia   . Hypertension   . Parkinson disease ([place])   . Paroxysmal atrial fibrillation (HCC)   . Peripheral arterial disease ([place])   . Skin cancer    History reviewed. No pertinent family history. Past Surgical History:  Procedure Laterality Date  . insert / replace/ remove peacemaker     Patient Active Problem List   Diagnosis Date Noted  . 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 CHF (congestive heart failure) ([place]) 06/24/2016  . A-fib ([place]) 06/24/2016  . HTN (hypertension) 06/24/2016  . Hyperlipidemia 06/24/2016      Prior to Admission medications   Medication Sig Start Date End Date Taking? Authorizing Provider  acetaminophen (TYLENOL) 325 MG tablet Take 650 mg by mouth 3 (three) times daily as needed  for mild pain.    [provider]  alfuzosin (UROXATRAL) 10 MG 24 hr tablet Take 10 mg by mouth daily with breakfast.    [provider]  apixaban (ELIQUIS) 2.5 MG TABS tablet Take 2.5 mg by mouth 2 (two) times daily.    [provider]  aspirin EC 81 MG tablet Take 81 mg by mouth daily.    [provider]  atorvastatin (LIPITOR) 40 MG tablet Take 40 mg by mouth daily.    [provider]  calcium-vitamin D (OSCAL WITH D) 500-200 MG-UNIT tablet Take 2 tablets by mouth daily with breakfast.    [provider]  carbidopa-levodopa (SINEMET IR) 25-100 MG tablet Take 2 tablets by mouth 3 (three) times daily.     [provider]  Cholecalciferol 10000 units TABS Take 1,000 Units by mouth daily.     [provider]  docusate sodium (COLACE) 100 MG capsule Take 200 mg by mouth 2 (two) times daily as needed for mild constipation.    [provider]  DULoxetine (CYMBALTA) 30 MG capsule Take 90 mg by mouth daily.    [provider]  finasteride (PROSCAR) 5 MG tablet Take 5 mg by mouth daily.    [provider]  folic acid (FOLVITE) 1 MG tablet Take 1 mg by mouth daily.    [provider]  furosemide (LASIX) 20 MG tablet Take 20 mg by  mouth daily as needed for fluid.    [provider]  Melatonin 3 MG TABS Take 6 mg by mouth at bedtime as needed (sleep).    [provider]  metoprolol succinate (TOPROL-XL) 25 MG 24 hr tablet Take 25 mg by mouth daily.    [provider]  OLANZapine (ZYPREXA) 5 MG tablet Take 2.5 mg (half tablet) each morning and 5 mg (1 tablet) each night at bedtime. Patient taking differently: Take 2.5 mg by mouth at bedtime.  06/24/16   Carrie Mew, MD  Omega-3 1000 MG CAPS Take 2,000 mg by mouth daily.    [provider]  pantoprazole (PROTONIX) 40 MG tablet Take 40 mg by mouth 2 (two) times daily.    [provider]  phenazopyridine  (PYRIDIUM) 200 MG tablet Take 1 tablet (200 mg total) by mouth 3 (three) times daily. Patient not taking: Reported on 02/19/2017 05/29/16   Crecencio Mc P, PA-C  psyllium (HYDROCIL/METAMUCIL) 95 % PACK Take 1 packet by mouth daily.    [provider]  senna-docusate (SENOKOT-S) 8.6-50 MG tablet Take 1 tablet by mouth 2 (two) times daily.    [provider]  tamsulosin (FLOMAX) 0.4 MG CAPS capsule Take 1 capsule (0.4 mg total) by mouth every morning. Patient not taking: Reported on 02/19/2017 05/29/16   Lorin Picket, PA-C  terazosin (HYTRIN) 5 MG capsule Take 5 mg by mouth at bedtime.    [provider]  vitamin B-12 (CYANOCOBALAMIN) 1000 MCG tablet Take 1,000 mcg by mouth daily.    [provider]    Allergies Bee venom    Social History Social History  Substance Use Topics  . Smoking status: Former Research scientist (life sciences)  . Smokeless tobacco: Never Used  . Alcohol use No    Review of Systems Patient denies headaches, rhinorrhea, blurry vision, numbness, shortness of breath, chest pain, edema, cough, abdominal pain, nausea, vomiting, diarrhea, dysuria, fevers, rashes or hallucinations unless otherwise stated above in HPI. ____________________________________________   PHYSICAL EXAM:  VITAL SIGNS: Vitals:   10/23/17 1527 10/23/17 1530  BP: 124/66   Pulse:  81  Resp:  (!) 23  Temp:    SpO2:  96%    Constitutional: Alert and in no acute distress. Eyes: Conjunctivae are normal.  Head: Atraumatic. Nose: No congestion/rhinnorhea. Mouth/Throat: Mucous membranes are moist.   Neck: No stridor. Painless ROM.  Cardiovascular: Normal rate, regular rhythm. Grossly normal heart sounds.  Good peripheral circulation. Respiratory: Normal respiratory effort.  No retractions. Lungs CTAB. Gastrointestinal: Soft and nontender. No distention. No abdominal bruits. No CVA tenderness. Musculoskeletal: No lower extremity tenderness nor edema.  No joint  effusions. Neurologic:  CN- intact.  No facial droop, Normal FNF.  Normal heel to shin.  Sensation intact bilaterally. Normal speech and language. No gross focal neurologic deficits are appreciated.  Skin:  Skin is warm, dry and intact. No rash noted. Psychiatric: Mood and affect are approp ____________________________________________   LABS (all labs ordered are listed, but only abnormal results are displayed)  Results for orders placed or performed during the hospital encounter of 10/23/17 (from the past 24 hour(s))  Troponin I     Status: None   Collection Time: 10/23/17  3:27 PM  Result Value Ref Range   Troponin I <0.03 <0.03 ng/mL  Basic metabolic panel     Status: Abnormal   Collection Time: 10/23/17  3:27 PM  Result Value Ref Range   Sodium 138 135 - 145 mmol/L   Potassium 4.5  3.5 - 5.1 mmol/L   Chloride 104 101 - 111 mmol/L   CO2 27 22 - 32 mmol/L   Glucose, Bld 130 (H) 65 - 99 mg/dL   BUN 22 (H) 6 - 20 mg/dL   Creatinine, Ser 1.30 (H) 0.61 - 1.24 mg/dL   Calcium 9.0 8.9 - 10.3 mg/dL   GFR calc non Af Amer 48 (L) >60 mL/min   GFR calc Af Amer 55 (L) >60 mL/min   Anion gap 7 5 - 15  CBC with Differential/Platelet     Status: Abnormal   Collection Time: 10/23/17  4:03 PM  Result Value Ref Range   WBC 4.4 3.8 - 10.6 K/uL   RBC 4.01 (L) 4.40 - 5.90 MIL/uL   Hemoglobin 11.9 (L) 13.0 - 18.0 g/dL   HCT 36.1 (L) 40.0 - 52.0 %   MCV 89.9 80.0 - 100.0 fL   MCH 29.6 26.0 - 34.0 pg   MCHC 33.0 32.0 - 36.0 g/dL   RDW 15.1 (H) 11.5 - 14.5 %   Platelets 148 (L) 150 - 440 K/uL   Neutrophils Relative % 59 %   Neutro Abs 2.6 1.4 - 6.5 K/uL   Lymphocytes Relative 28 %   Lymphs Abs 1.3 1.0 - 3.6 K/uL   Monocytes Relative 10 %   Monocytes Absolute 0.5 0.2 - 1.0 K/uL   Eosinophils Relative 2 %   Eosinophils Absolute 0.1 0 - 0.7 K/uL   Basophils Relative 1 %   Basophils Absolute 0.0 0 - 0.1 K/uL   ____________________________________________  EKG My review and personal  interpretation at Time: 15:24   Indication: lightheadedness  Rate: 70  Rhythm: a-v paced Axis: normal Other: no sgrabossa, non specific st changes in the setting of paced rhythm ____________________________________________  RADIOLOGY  I personally reviewed all radiographic images ordered to evaluate for the above acute complaints and reviewed radiology reports and findings.  These findings were personally discussed with the patient.  Please see medical record for radiology report.  ____________________________________________   PROCEDURES  Procedure(s) performed:  Procedures    Critical Care performed: no ____________________________________________   INITIAL IMPRESSION / ASSESSMENT AND PLAN / ED COURSE  Pertinent labs & imaging results that were available during my care of the patient were reviewed by me and considered in my medical decision making (see chart for details).  DDX: cva, tia, hypoglycemia, dehydration, electrolyte abnormality, orthostasis. sepsis   Azim Gillingham is a 83 y.o. who presents to the ED with lightheadedness as described above.  Patient arrives in no acute distress.  Symptoms appear most clinically consistent with some component of dehydration or orthostasis.  Symptoms have resolved.  He has no focal neuro deficits at this time.  CT head ordered to evaluate for acute abnormality shows chronic ischemic changes but no mass or bleed.  EKG shows no acute ischemia.  Blood work does show mild dehydration without significant acute anemia or leukocytosis.  Patient remains hemodynamically stable is tolerating oral aeration and had improvement in symptoms after small bolus of IV fluids.  I do believe the patient is stable and appropriate for discharge back to facility.      ____________________________________________   FINAL CLINICAL IMPRESSION(S) / ED DIAGNOSES  Final diagnoses:  Near syncope  Dehydration      NEW MEDICATIONS STARTED DURING THIS  VISIT:  New Prescriptions   No medications on file     Note:  This document was prepared using Dragon voice recognition software and may include unintentional dictation errors.  Merlyn Lot, MD 10/23/17 [phone]

## 2017-12-26 ENCOUNTER — Encounter: Payer: Self-pay | Admitting: Emergency Medicine

## 2017-12-26 ENCOUNTER — Other Ambulatory Visit: Payer: Self-pay

## 2017-12-26 ENCOUNTER — Emergency Department
Admission: EM | Admit: 2017-12-26 | Discharge: 2017-12-26 | Disposition: A | Discharge status: Home / Self Care | Payer: Medicare Other | Attending: Emergency Medicine | Admitting: Emergency Medicine

## 2017-12-26 ENCOUNTER — Emergency Department: Payer: Medicare Other

## 2017-12-26 DIAGNOSIS — Z85828 Personal history of other malignant neoplasm of skin: Secondary | ICD-10-CM | POA: Diagnosis not present

## 2017-12-26 DIAGNOSIS — I110 Hypertensive heart disease with heart failure: Secondary | ICD-10-CM | POA: Insufficient documentation

## 2017-12-26 DIAGNOSIS — I509 Heart failure, unspecified: Secondary | ICD-10-CM | POA: Insufficient documentation

## 2017-12-26 DIAGNOSIS — R319 Hematuria, unspecified: Secondary | ICD-10-CM | POA: Diagnosis not present

## 2017-12-26 DIAGNOSIS — N210 Calculus in bladder: Secondary | ICD-10-CM | POA: Insufficient documentation

## 2017-12-26 DIAGNOSIS — Z8679 Personal history of other diseases of the circulatory system: Secondary | ICD-10-CM | POA: Insufficient documentation

## 2017-12-26 DIAGNOSIS — Z87891 Personal history of nicotine dependence: Secondary | ICD-10-CM | POA: Diagnosis not present

## 2017-12-26 DIAGNOSIS — R339 Retention of urine, unspecified: Secondary | ICD-10-CM | POA: Diagnosis not present

## 2017-12-26 DIAGNOSIS — R1030 Lower abdominal pain, unspecified: Secondary | ICD-10-CM | POA: Insufficient documentation

## 2017-12-26 DIAGNOSIS — Z950 Presence of cardiac pacemaker: Secondary | ICD-10-CM | POA: Diagnosis not present

## 2017-12-26 DIAGNOSIS — F0280 Dementia in other diseases classified elsewhere without behavioral disturbance: Secondary | ICD-10-CM | POA: Diagnosis not present

## 2017-12-26 DIAGNOSIS — Z743 Need for continuous supervision: Secondary | ICD-10-CM | POA: Diagnosis not present

## 2017-12-26 DIAGNOSIS — G20 Parkinson's disease: Secondary | ICD-10-CM | POA: Diagnosis not present

## 2017-12-26 DIAGNOSIS — F05 Delirium due to known physiological condition: Secondary | ICD-10-CM | POA: Diagnosis not present

## 2017-12-26 DIAGNOSIS — N200 Calculus of kidney: Secondary | ICD-10-CM | POA: Diagnosis not present

## 2017-12-26 LAB — CBC WITH DIFFERENTIAL/PLATELET
BASOS ABS: 0 10*3/uL (ref 0–0.1)
BASOS PCT: 1 %
EOS ABS: 0 10*3/uL (ref 0–0.7)
EOS PCT: 1 %
HCT: 36.4 % — ABNORMAL LOW (ref 40.0–52.0)
Hemoglobin: 11.8 g/dL — ABNORMAL LOW (ref 13.0–18.0)
Lymphocytes Relative: 15 %
Lymphs Abs: 0.8 10*3/uL — ABNORMAL LOW (ref 1.0–3.6)
MCH: 28.8 pg (ref 26.0–34.0)
MCHC: 32.6 g/dL (ref 32.0–36.0)
MCV: 88.3 fL (ref 80.0–100.0)
Monocytes Absolute: 0.3 10*3/uL (ref 0.2–1.0)
Monocytes Relative: 6 %
NEUTROS PCT: 77 %
Neutro Abs: 4.2 10*3/uL (ref 1.4–6.5)
PLATELETS: 164 10*3/uL (ref 150–440)
RBC: 4.12 MIL/uL — AB (ref 4.40–5.90)
RDW: 15.7 % — ABNORMAL HIGH (ref 11.5–14.5)
WBC: 5.4 10*3/uL (ref 3.8–10.6)

## 2017-12-26 LAB — URINALYSIS, COMPLETE (UACMP) WITH MICROSCOPIC
BILIRUBIN URINE: NEGATIVE
Bacteria, UA: NONE SEEN
Glucose, UA: NEGATIVE mg/dL
Ketones, ur: 5 mg/dL — AB
Leukocytes, UA: NEGATIVE
NITRITE: NEGATIVE
Protein, ur: 100 mg/dL — AB
Specific Gravity, Urine: 1.023 (ref 1.005–1.030)
Squamous Epithelial / LPF: NONE SEEN
pH: 5 (ref 5.0–8.0)

## 2017-12-26 LAB — COMPREHENSIVE METABOLIC PANEL
ALBUMIN: 3.7 g/dL (ref 3.5–5.0)
AST: 26 U/L (ref 15–41)
Alkaline Phosphatase: 49 U/L (ref 38–126)
Anion gap: 9 (ref 5–15)
BUN: 16 mg/dL (ref 6–20)
CHLORIDE: 105 mmol/L (ref 101–111)
CO2: 25 mmol/L (ref 22–32)
CREATININE: 1.08 mg/dL (ref 0.61–1.24)
Calcium: 9.2 mg/dL (ref 8.9–10.3)
GFR calc non Af Amer: 60 mL/min — ABNORMAL LOW (ref >60)
Glucose, Bld: 138 mg/dL — ABNORMAL HIGH (ref 65–99)
Potassium: 3.6 mmol/L (ref 3.5–5.1)
Sodium: 139 mmol/L (ref 135–145)
Total Bilirubin: 1.5 mg/dL — ABNORMAL HIGH (ref 0.3–1.2)
Total Protein: 6.8 g/dL (ref 6.5–8.1)

## 2017-12-26 MED ORDER — ACETAMINOPHEN 500 MG PO TABS
ORAL_TABLET | ORAL | Status: AC
  Filled 2017-12-26: qty 2

## 2017-12-26 MED ORDER — ACETAMINOPHEN 500 MG PO TABS
1000.0000 mg | ORAL_TABLET | Freq: Once | ORAL | Status: AC
  Administered 2017-12-26: 1000 mg via ORAL

## 2017-12-26 MED ORDER — CEFTRIAXONE SODIUM IN DEXTROSE 20 MG/ML IV SOLN
1.0000 g | Freq: Once | INTRAVENOUS | Status: AC
  Administered 2017-12-26: 1 g via INTRAVENOUS
  Filled 2017-12-26: qty 50

## 2017-12-26 MED ORDER — CEPHALEXIN 250 MG PO CAPS: 250.0000 mg | ORAL_CAPSULE | Freq: Four times a day (QID) | ORAL | 0 refills | Status: AC

## 2017-12-26 NOTE — ED Notes (Signed)
Pt stood to urinate about 150 ml dark urine.  He says he would like his tylenol now, as he normally takes 2 ex str tylenol every day for a headache.  Pt in nad.  Says he is being discharged.

## 2017-12-26 NOTE — ED Provider Notes (Signed)
[facility] Emergency Department Provider Note       Time seen: ----------------------------------------- 9:20 AM on 12/26/2017 ----------------------------------------- Level V caveat: History/ROS limited by dementia  I have reviewed the triage vital signs and the nursing notes.  HISTORY   Chief Complaint Hematuria and Abdominal Pain    HPI Danny Grimes is a 84 y.o. male with a history of CHF, depression, Parkinson's disease with dementia, A. fib who presents to the ED for possible abdominal pain and hematuria.  Patient states he feels "rough".  He denies fevers, chills, chest pain, shortness of breath.  Patient states he has had some lower abdominal pain and has had trouble urinating.  There were reports that he has had hematuria.  Past Medical History:  Diagnosis Date  . Adenocarcinoma ([place])   . Anxiety   . Bilateral renal masses   . CHF (congestive heart failure) ([place])   . Colon polyp   . Degenerative joint disease   . Depression   . DNR (do not resuscitate)   . Fitting and adjustment of cardiac pacemaker   . Hemiparesis affecting left side as late effect of cerebrovascular accident (CVA) ([place])   . Hyperlipemia   . Hypertension   . Parkinson disease ([place])   . Paroxysmal atrial fibrillation (HCC)   . Peripheral arterial disease ([place])   . Skin cancer     Patient Active Problem List   Diagnosis Date Noted  . Dementia in Parkinson's disease ([place]) 06/24/2016  . CHF (congestive heart failure) ([place]) 06/24/2016  . A-fib ([place]) 06/24/2016  . HTN (hypertension) 06/24/2016  . Hyperlipidemia 06/24/2016    Past Surgical History:  Procedure Laterality Date  . insert / replace/ remove peacemaker      Allergies Bee venom  Social History Social History   Tobacco Use  . Smoking status: Former Research scientist (life sciences)  . Smokeless tobacco: Never Used  Substance Use Topics  . Alcohol use: No  . Drug use: No    Review of Systems Constitutional: Negative for  fever. Cardiovascular: Negative for chest pain. Respiratory: Negative for shortness of breath. Gastrointestinal: Positive for abdominal pain Genitourinary: Positive for dysuria, hematuria Musculoskeletal: Negative for back pain. Skin: Negative for rash. Neurological: Negative for headaches, focal weakness or numbness.  All systems negative/normal/unremarkable except as stated in the HPI  ____________________________________________   PHYSICAL EXAM:  VITAL SIGNS: ED Triage Vitals  Enc Vitals Group     BP 12/26/17 0919 (!) 144/76     Pulse Rate 12/26/17 0919 71     Resp 12/26/17 0919 16     Temp 12/26/17 0919 97.8 F (36.6 C)     Temp Source 12/26/17 0919 Oral     SpO2 12/26/17 0919 97 %     Weight --      Height --      Head Circumference --      Peak Flow --      Pain Score 12/26/17 0918 8     Pain Loc --      Pain Edu? --      Excl. in [place]? --     Constitutional: Alert but disoriented.  Well appearing and in no distress. Eyes: Conjunctivae are normal. Normal extraocular movements. ENT   Head: Normocephalic and atraumatic.   Nose: No congestion/rhinnorhea.   Mouth/Throat: Mucous membranes are moist.   Neck: No stridor. Cardiovascular: Normal rate, regular rhythm. No murmurs, rubs, or gallops. Respiratory: Normal respiratory effort without tachypnea nor retractions. Breath sounds are clear and equal bilaterally.  No wheezes/rales/rhonchi. Gastrointestinal: Soft and nontender. Normal bowel sounds Musculoskeletal: Nontender with normal range of motion in extremities. No lower extremity tenderness nor edema. Neurologic:  Normal speech and language. No gross focal neurologic deficits are appreciated.  Skin:  Skin is warm, dry and intact. No rash noted. Psychiatric: Mood and affect are normal.  ____________________________________________  ED COURSE:  As part of my medical decision making, I reviewed the following data within the [place]  History obtained from family if available, nursing notes, old chart and ekg, as well as notes from prior ED visits. Patient presented for abdominal pain and possible hematuria, we will assess with labs and imaging as indicated at this time.   Procedures ____________________________________________   LABS (pertinent positives/negatives)  Labs Reviewed  CBC WITH DIFFERENTIAL/PLATELET - Abnormal; Notable for the following components:      Result Value   RBC 4.12 (*)    Hemoglobin 11.8 (*)    HCT 36.4 (*)    RDW 15.7 (*)    Lymphs Abs 0.8 (*)    All other components within normal limits  COMPREHENSIVE METABOLIC PANEL - Abnormal; Notable for the following components:   Glucose, Bld 138 (*)    ALT <5 (*)    Total Bilirubin 1.5 (*)    GFR calc non Af Amer 60 (*)    All other components within normal limits  URINALYSIS, COMPLETE (UACMP) WITH MICROSCOPIC - Abnormal; Notable for the following components:   Color, Urine AMBER (*)    APPearance CLOUDY (*)    Hgb urine dipstick LARGE (*)    Ketones, ur 5 (*)    Protein, ur 100 (*)    All other components within normal limits  URINE CULTURE    RADIOLOGY Images were viewed by me  CT renal protocol IMPRESSION: Multiple cystic lesions within the kidneys which cannot be fully characterized without intravenous contrast but appear overall stable since prior contrast CT. No hydronephrosis.  Small 2-3 mm bladder stone layering dependently in the urinary bladder.  Prostate enlargement.  Aortoiliac atherosclerosis. ____________________________________________  DIFFERENTIAL DIAGNOSIS   UTI, BPH, renal cyst, renal cell carcinoma, anticoagulation  FINAL ASSESSMENT AND PLAN  Abdominal pain, hematuria   Plan: Patient had presented for abdominal pain and possible hematuria.  Patient's labs do reveal hematuria.  There is borderline evidence for UTI, he was given IV Rocephin for this.. Patient's imaging revealed a bladder stone and renal  cysts.  He will be referred to urology for outpatient follow-up.   Earleen Newport, MD   Note: This note was generated in part or whole with voice recognition software. Voice recognition is usually quite accurate but there are transcription errors that can and very often do occur. I apologize for any typographical errors that were not detected and corrected.     Earleen Newport, MD 12/26/17 1239

## 2017-12-26 NOTE — ED Triage Notes (Signed)
Patient from [facility] complaining of blood in urine and lower abdominal pain. Patient has history of BPH and a renal mass. Patient alert to person and place. Denies N/V/D.

## 2017-12-28 LAB — URINE CULTURE
Culture: 50000 — AB
SPECIAL REQUESTS: NORMAL

## 2018-01-21 DIAGNOSIS — I509 Heart failure, unspecified: Secondary | ICD-10-CM | POA: Diagnosis not present

## 2018-01-21 DIAGNOSIS — G20 Parkinson's disease: Secondary | ICD-10-CM | POA: Diagnosis not present

## 2018-01-21 DIAGNOSIS — I130 Hypertensive heart and chronic kidney disease with heart failure and stage 1 through stage 4 chronic kidney disease, or unspecified chronic kidney disease: Secondary | ICD-10-CM | POA: Diagnosis not present

## 2018-01-21 DIAGNOSIS — M1991 Primary osteoarthritis, unspecified site: Secondary | ICD-10-CM | POA: Diagnosis not present

## 2018-01-21 DIAGNOSIS — N182 Chronic kidney disease, stage 2 (mild): Secondary | ICD-10-CM | POA: Diagnosis not present

## 2018-01-21 DIAGNOSIS — I480 Paroxysmal atrial fibrillation: Secondary | ICD-10-CM | POA: Diagnosis not present

## 2018-01-23 DIAGNOSIS — N182 Chronic kidney disease, stage 2 (mild): Secondary | ICD-10-CM | POA: Diagnosis not present

## 2018-01-23 DIAGNOSIS — G20 Parkinson's disease: Secondary | ICD-10-CM | POA: Diagnosis not present

## 2018-01-23 DIAGNOSIS — I130 Hypertensive heart and chronic kidney disease with heart failure and stage 1 through stage 4 chronic kidney disease, or unspecified chronic kidney disease: Secondary | ICD-10-CM | POA: Diagnosis not present

## 2018-01-23 DIAGNOSIS — M1991 Primary osteoarthritis, unspecified site: Secondary | ICD-10-CM | POA: Diagnosis not present

## 2018-01-23 DIAGNOSIS — I480 Paroxysmal atrial fibrillation: Secondary | ICD-10-CM | POA: Diagnosis not present

## 2018-01-23 DIAGNOSIS — I509 Heart failure, unspecified: Secondary | ICD-10-CM | POA: Diagnosis not present

## 2018-01-28 DIAGNOSIS — M1991 Primary osteoarthritis, unspecified site: Secondary | ICD-10-CM | POA: Diagnosis not present

## 2018-01-28 DIAGNOSIS — I509 Heart failure, unspecified: Secondary | ICD-10-CM | POA: Diagnosis not present

## 2018-01-28 DIAGNOSIS — I130 Hypertensive heart and chronic kidney disease with heart failure and stage 1 through stage 4 chronic kidney disease, or unspecified chronic kidney disease: Secondary | ICD-10-CM | POA: Diagnosis not present

## 2018-01-28 DIAGNOSIS — I480 Paroxysmal atrial fibrillation: Secondary | ICD-10-CM | POA: Diagnosis not present

## 2018-01-28 DIAGNOSIS — N182 Chronic kidney disease, stage 2 (mild): Secondary | ICD-10-CM | POA: Diagnosis not present

## 2018-01-28 DIAGNOSIS — G20 Parkinson's disease: Secondary | ICD-10-CM | POA: Diagnosis not present

## 2018-01-30 DIAGNOSIS — I509 Heart failure, unspecified: Secondary | ICD-10-CM | POA: Diagnosis not present

## 2018-01-30 DIAGNOSIS — G20 Parkinson's disease: Secondary | ICD-10-CM | POA: Diagnosis not present

## 2018-01-30 DIAGNOSIS — N182 Chronic kidney disease, stage 2 (mild): Secondary | ICD-10-CM | POA: Diagnosis not present

## 2018-01-30 DIAGNOSIS — M1991 Primary osteoarthritis, unspecified site: Secondary | ICD-10-CM | POA: Diagnosis not present

## 2018-01-30 DIAGNOSIS — I130 Hypertensive heart and chronic kidney disease with heart failure and stage 1 through stage 4 chronic kidney disease, or unspecified chronic kidney disease: Secondary | ICD-10-CM | POA: Diagnosis not present

## 2018-01-30 DIAGNOSIS — I480 Paroxysmal atrial fibrillation: Secondary | ICD-10-CM | POA: Diagnosis not present

## 2018-02-04 DIAGNOSIS — I509 Heart failure, unspecified: Secondary | ICD-10-CM | POA: Diagnosis not present

## 2018-02-04 DIAGNOSIS — G20 Parkinson's disease: Secondary | ICD-10-CM | POA: Diagnosis not present

## 2018-02-04 DIAGNOSIS — I480 Paroxysmal atrial fibrillation: Secondary | ICD-10-CM | POA: Diagnosis not present

## 2018-02-04 DIAGNOSIS — M1991 Primary osteoarthritis, unspecified site: Secondary | ICD-10-CM | POA: Diagnosis not present

## 2018-02-04 DIAGNOSIS — I130 Hypertensive heart and chronic kidney disease with heart failure and stage 1 through stage 4 chronic kidney disease, or unspecified chronic kidney disease: Secondary | ICD-10-CM | POA: Diagnosis not present

## 2018-02-04 DIAGNOSIS — N182 Chronic kidney disease, stage 2 (mild): Secondary | ICD-10-CM | POA: Diagnosis not present

## 2018-02-06 DIAGNOSIS — N182 Chronic kidney disease, stage 2 (mild): Secondary | ICD-10-CM | POA: Diagnosis not present

## 2018-02-06 DIAGNOSIS — G20 Parkinson's disease: Secondary | ICD-10-CM | POA: Diagnosis not present

## 2018-02-06 DIAGNOSIS — I509 Heart failure, unspecified: Secondary | ICD-10-CM | POA: Diagnosis not present

## 2018-02-06 DIAGNOSIS — M1991 Primary osteoarthritis, unspecified site: Secondary | ICD-10-CM | POA: Diagnosis not present

## 2018-02-06 DIAGNOSIS — I130 Hypertensive heart and chronic kidney disease with heart failure and stage 1 through stage 4 chronic kidney disease, or unspecified chronic kidney disease: Secondary | ICD-10-CM | POA: Diagnosis not present

## 2018-02-06 DIAGNOSIS — I480 Paroxysmal atrial fibrillation: Secondary | ICD-10-CM | POA: Diagnosis not present

## 2018-02-12 DIAGNOSIS — I480 Paroxysmal atrial fibrillation: Secondary | ICD-10-CM | POA: Diagnosis not present

## 2018-02-12 DIAGNOSIS — I509 Heart failure, unspecified: Secondary | ICD-10-CM | POA: Diagnosis not present

## 2018-02-12 DIAGNOSIS — G20 Parkinson's disease: Secondary | ICD-10-CM | POA: Diagnosis not present

## 2018-02-12 DIAGNOSIS — I130 Hypertensive heart and chronic kidney disease with heart failure and stage 1 through stage 4 chronic kidney disease, or unspecified chronic kidney disease: Secondary | ICD-10-CM | POA: Diagnosis not present

## 2018-02-12 DIAGNOSIS — M1991 Primary osteoarthritis, unspecified site: Secondary | ICD-10-CM | POA: Diagnosis not present

## 2018-02-12 DIAGNOSIS — N182 Chronic kidney disease, stage 2 (mild): Secondary | ICD-10-CM | POA: Diagnosis not present

## 2018-02-18 DIAGNOSIS — G20 Parkinson's disease: Secondary | ICD-10-CM | POA: Diagnosis not present

## 2018-02-18 DIAGNOSIS — I509 Heart failure, unspecified: Secondary | ICD-10-CM | POA: Diagnosis not present

## 2018-02-18 DIAGNOSIS — I480 Paroxysmal atrial fibrillation: Secondary | ICD-10-CM | POA: Diagnosis not present

## 2018-02-18 DIAGNOSIS — I130 Hypertensive heart and chronic kidney disease with heart failure and stage 1 through stage 4 chronic kidney disease, or unspecified chronic kidney disease: Secondary | ICD-10-CM | POA: Diagnosis not present

## 2018-02-18 DIAGNOSIS — N182 Chronic kidney disease, stage 2 (mild): Secondary | ICD-10-CM | POA: Diagnosis not present

## 2018-02-18 DIAGNOSIS — M1991 Primary osteoarthritis, unspecified site: Secondary | ICD-10-CM | POA: Diagnosis not present

## 2018-02-21 ENCOUNTER — Emergency Department: Payer: Medicare Other

## 2018-02-21 ENCOUNTER — Encounter: Payer: Self-pay | Admitting: Emergency Medicine

## 2018-02-21 ENCOUNTER — Other Ambulatory Visit: Payer: Self-pay

## 2018-02-21 ENCOUNTER — Observation Stay (HOSPITAL_BASED_OUTPATIENT_CLINIC_OR_DEPARTMENT_OTHER)
Admit: 2018-02-21 | Discharge: 2018-02-21 | Disposition: A | Discharge status: Home / Self Care | Payer: Medicare Other | Attending: Internal Medicine | Admitting: Internal Medicine

## 2018-02-21 ENCOUNTER — Observation Stay
Admission: EM | Admit: 2018-02-21 | Discharge: 2018-02-22 | Disposition: A | Discharge status: Nursing Home (Includes ICF) | Payer: Medicare Other | Attending: Family Medicine | Admitting: Family Medicine

## 2018-02-21 DIAGNOSIS — I340 Nonrheumatic mitral (valve) insufficiency: Secondary | ICD-10-CM | POA: Diagnosis not present

## 2018-02-21 DIAGNOSIS — Z950 Presence of cardiac pacemaker: Secondary | ICD-10-CM | POA: Insufficient documentation

## 2018-02-21 DIAGNOSIS — I69354 Hemiplegia and hemiparesis following cerebral infarction affecting left non-dominant side: Secondary | ICD-10-CM | POA: Insufficient documentation

## 2018-02-21 DIAGNOSIS — R079 Chest pain, unspecified: Secondary | ICD-10-CM | POA: Diagnosis not present

## 2018-02-21 DIAGNOSIS — E785 Hyperlipidemia, unspecified: Secondary | ICD-10-CM | POA: Diagnosis not present

## 2018-02-21 DIAGNOSIS — Z7982 Long term (current) use of aspirin: Secondary | ICD-10-CM | POA: Diagnosis not present

## 2018-02-21 DIAGNOSIS — M791 Myalgia, unspecified site: Secondary | ICD-10-CM | POA: Diagnosis not present

## 2018-02-21 DIAGNOSIS — G20 Parkinson's disease: Secondary | ICD-10-CM | POA: Insufficient documentation

## 2018-02-21 DIAGNOSIS — F0280 Dementia in other diseases classified elsewhere without behavioral disturbance: Secondary | ICD-10-CM | POA: Insufficient documentation

## 2018-02-21 DIAGNOSIS — I480 Paroxysmal atrial fibrillation: Secondary | ICD-10-CM | POA: Insufficient documentation

## 2018-02-21 DIAGNOSIS — K922 Gastrointestinal hemorrhage, unspecified: Secondary | ICD-10-CM

## 2018-02-21 DIAGNOSIS — F329 Major depressive disorder, single episode, unspecified: Secondary | ICD-10-CM | POA: Insufficient documentation

## 2018-02-21 DIAGNOSIS — F419 Anxiety disorder, unspecified: Secondary | ICD-10-CM | POA: Insufficient documentation

## 2018-02-21 DIAGNOSIS — Z87891 Personal history of nicotine dependence: Secondary | ICD-10-CM | POA: Diagnosis not present

## 2018-02-21 DIAGNOSIS — Z79899 Other long term (current) drug therapy: Secondary | ICD-10-CM | POA: Insufficient documentation

## 2018-02-21 DIAGNOSIS — I5023 Acute on chronic systolic (congestive) heart failure: Secondary | ICD-10-CM

## 2018-02-21 DIAGNOSIS — K219 Gastro-esophageal reflux disease without esophagitis: Secondary | ICD-10-CM | POA: Diagnosis not present

## 2018-02-21 DIAGNOSIS — I509 Heart failure, unspecified: Secondary | ICD-10-CM | POA: Diagnosis not present

## 2018-02-21 DIAGNOSIS — D509 Iron deficiency anemia, unspecified: Secondary | ICD-10-CM | POA: Diagnosis not present

## 2018-02-21 DIAGNOSIS — R531 Weakness: Principal | ICD-10-CM | POA: Insufficient documentation

## 2018-02-21 DIAGNOSIS — I5021 Acute systolic (congestive) heart failure: Secondary | ICD-10-CM | POA: Insufficient documentation

## 2018-02-21 DIAGNOSIS — D649 Anemia, unspecified: Secondary | ICD-10-CM | POA: Diagnosis not present

## 2018-02-21 DIAGNOSIS — R601 Generalized edema: Secondary | ICD-10-CM

## 2018-02-21 DIAGNOSIS — Z7901 Long term (current) use of anticoagulants: Secondary | ICD-10-CM | POA: Insufficient documentation

## 2018-02-21 DIAGNOSIS — I4891 Unspecified atrial fibrillation: Secondary | ICD-10-CM | POA: Diagnosis not present

## 2018-02-21 DIAGNOSIS — R0602 Shortness of breath: Secondary | ICD-10-CM | POA: Diagnosis not present

## 2018-02-21 DIAGNOSIS — I110 Hypertensive heart disease with heart failure: Secondary | ICD-10-CM | POA: Diagnosis not present

## 2018-02-21 DIAGNOSIS — R52 Pain, unspecified: Secondary | ICD-10-CM | POA: Diagnosis not present

## 2018-02-21 LAB — URINALYSIS, COMPLETE (UACMP) WITH MICROSCOPIC
BILIRUBIN URINE: NEGATIVE
Bacteria, UA: NONE SEEN
GLUCOSE, UA: NEGATIVE mg/dL
Hgb urine dipstick: NEGATIVE
KETONES UR: 5 mg/dL — AB
LEUKOCYTES UA: NEGATIVE
NITRITE: NEGATIVE
Protein, ur: NEGATIVE mg/dL
Specific Gravity, Urine: 1.018 (ref 1.005–1.030)
pH: 6 (ref 5.0–8.0)

## 2018-02-21 LAB — ECHOCARDIOGRAM COMPLETE
Height: 72 in
WEIGHTICAEL: 2853.6342 [oz_av]

## 2018-02-21 LAB — CBC
HCT: 30.4 % — ABNORMAL LOW (ref 40.0–52.0)
Hemoglobin: 10 g/dL — ABNORMAL LOW (ref 13.0–18.0)
MCH: 28.2 pg (ref 26.0–34.0)
MCHC: 32.9 g/dL (ref 32.0–36.0)
MCV: 85.8 fL (ref 80.0–100.0)
Platelets: 346 10*3/uL (ref 150–440)
RBC: 3.54 MIL/uL — AB (ref 4.40–5.90)
RDW: 16.6 % — ABNORMAL HIGH (ref 11.5–14.5)
WBC: 7.3 10*3/uL (ref 3.8–10.6)

## 2018-02-21 LAB — FOLATE: FOLATE: 36.4 ng/mL (ref >5.9)

## 2018-02-21 LAB — BASIC METABOLIC PANEL
Anion gap: 9 (ref 5–15)
BUN: 24 mg/dL — ABNORMAL HIGH (ref 6–20)
CALCIUM: 9 mg/dL (ref 8.9–10.3)
CO2: 27 mmol/L (ref 22–32)
CREATININE: 1.02 mg/dL (ref 0.61–1.24)
Chloride: 104 mmol/L (ref 101–111)
GFR calc Af Amer: >60 mL/min (ref >60)
Glucose, Bld: 133 mg/dL — ABNORMAL HIGH (ref 65–99)
Potassium: 3.6 mmol/L (ref 3.5–5.1)
SODIUM: 140 mmol/L (ref 135–145)

## 2018-02-21 LAB — IRON AND TIBC
IRON: 18 ug/dL — AB (ref 45–182)
Saturation Ratios: 7 % — ABNORMAL LOW (ref 17.9–39.5)
TIBC: 254 ug/dL (ref 250–450)
UIBC: 236 ug/dL

## 2018-02-21 LAB — TSH: TSH: 0.657 u[IU]/mL (ref 0.350–4.500)

## 2018-02-21 LAB — MRSA PCR SCREENING: MRSA by PCR: NEGATIVE

## 2018-02-21 LAB — INFLUENZA PANEL BY PCR (TYPE A & B)
Influenza A By PCR: NEGATIVE
Influenza B By PCR: NEGATIVE

## 2018-02-21 LAB — FERRITIN: FERRITIN: 64 ng/mL (ref 24–336)

## 2018-02-21 LAB — RETICULOCYTES
RBC.: 3.32 MIL/uL — ABNORMAL LOW (ref 4.40–5.90)
RETIC COUNT ABSOLUTE: 43.2 10*3/uL (ref 19.0–183.0)
Retic Ct Pct: 1.3 % (ref 0.4–3.1)

## 2018-02-21 LAB — TROPONIN I: Troponin I: 0.03 ng/mL (ref <0.03)

## 2018-02-21 LAB — VITAMIN B12: Vitamin B-12: 1222 pg/mL — ABNORMAL HIGH (ref 180–914)

## 2018-02-21 LAB — CK: Total CK: 36 U/L — ABNORMAL LOW (ref 49–397)

## 2018-02-21 MED ORDER — APIXABAN 5 MG PO TABS
5.0000 mg | ORAL_TABLET | Freq: Two times a day (BID) | ORAL | Status: DC
  Administered 2018-02-21 – 2018-02-22 (×3): 5 mg via ORAL
  Filled 2018-02-21 (×3): qty 1

## 2018-02-21 MED ORDER — CARBIDOPA-LEVODOPA 25-100 MG PO TABS
2.0000 | ORAL_TABLET | Freq: Three times a day (TID) | ORAL | Status: DC
  Administered 2018-02-21 – 2018-02-22 (×4): 2 via ORAL
  Filled 2018-02-21 (×6): qty 2

## 2018-02-21 MED ORDER — ZINC OXIDE 40 % EX OINT
TOPICAL_OINTMENT | Freq: Two times a day (BID) | CUTANEOUS | Status: DC
Start: 2018-02-21 — End: 2018-02-22
  Administered 2018-02-21 – 2018-02-22 (×2): via TOPICAL
  Filled 2018-02-21: qty 114

## 2018-02-21 MED ORDER — PETROLATUM-ZINC OXIDE 49-15 % EX OINT: TOPICAL_OINTMENT | Freq: Two times a day (BID) | CUTANEOUS | Status: DC

## 2018-02-21 MED ORDER — TERAZOSIN HCL 5 MG PO CAPS
5.0000 mg | ORAL_CAPSULE | Freq: Every day | ORAL | Status: DC
  Filled 2018-02-21 (×3): qty 1

## 2018-02-21 MED ORDER — ONDANSETRON HCL 4 MG PO TABS: 4.0000 mg | ORAL_TABLET | Freq: Four times a day (QID) | ORAL | Status: DC | PRN

## 2018-02-21 MED ORDER — ACETAMINOPHEN 650 MG RE SUPP: 650.0000 mg | Freq: Four times a day (QID) | RECTAL | Status: DC | PRN

## 2018-02-21 MED ORDER — ACETAMINOPHEN 325 MG PO TABS: 650.0000 mg | ORAL_TABLET | Freq: Four times a day (QID) | ORAL | Status: DC | PRN

## 2018-02-21 MED ORDER — TAMSULOSIN HCL 0.4 MG PO CAPS
0.4000 mg | ORAL_CAPSULE | Freq: Every morning | ORAL | Status: DC
  Administered 2018-02-21 – 2018-02-22 (×2): 0.4 mg via ORAL
  Filled 2018-02-21 (×2): qty 1

## 2018-02-21 MED ORDER — POLYETHYLENE GLYCOL 3350 17 G PO PACK
17.0000 g | PACK | Freq: Every day | ORAL | Status: DC
  Administered 2018-02-21: 17 g via ORAL
  Filled 2018-02-21: qty 1

## 2018-02-21 MED ORDER — DULOXETINE HCL 30 MG PO CPEP
90.0000 mg | ORAL_CAPSULE | Freq: Every day | ORAL | Status: DC
  Administered 2018-02-21 – 2018-02-22 (×2): 90 mg via ORAL
  Filled 2018-02-21 (×2): qty 3

## 2018-02-21 MED ORDER — FUROSEMIDE 10 MG/ML IJ SOLN
40.0000 mg | Freq: Once | INTRAMUSCULAR | Status: AC
  Administered 2018-02-21: 40 mg via INTRAVENOUS
  Filled 2018-02-21: qty 4

## 2018-02-21 MED ORDER — FUROSEMIDE 10 MG/ML IJ SOLN
20.0000 mg | Freq: Once | INTRAMUSCULAR | Status: DC
Start: 2018-02-21 — End: 2018-02-21

## 2018-02-21 MED ORDER — PANTOPRAZOLE SODIUM 40 MG PO TBEC
40.0000 mg | DELAYED_RELEASE_TABLET | Freq: Two times a day (BID) | ORAL | Status: DC
  Administered 2018-02-21 – 2018-02-22 (×3): 40 mg via ORAL
  Filled 2018-02-21 (×3): qty 1

## 2018-02-21 MED ORDER — VITAMIN B-12 1000 MCG PO TABS
1000.0000 ug | ORAL_TABLET | Freq: Every day | ORAL | Status: DC
  Administered 2018-02-21 – 2018-02-22 (×2): 1000 ug via ORAL
  Filled 2018-02-21 (×2): qty 1

## 2018-02-21 MED ORDER — FINASTERIDE 5 MG PO TABS: 5.0000 mg | ORAL_TABLET | ORAL | Status: DC

## 2018-02-21 MED ORDER — FOLIC ACID 1 MG PO TABS
1.0000 mg | ORAL_TABLET | Freq: Every day | ORAL | Status: DC
Start: 2018-02-21 — End: 2018-02-22
  Administered 2018-02-21 – 2018-02-22 (×2): 1 mg via ORAL
  Filled 2018-02-21 (×2): qty 1

## 2018-02-21 MED ORDER — SENNOSIDES-DOCUSATE SODIUM 8.6-50 MG PO TABS
1.0000 | ORAL_TABLET | Freq: Two times a day (BID) | ORAL | Status: DC
  Administered 2018-02-21 (×2): 1 via ORAL
  Filled 2018-02-21 (×2): qty 1

## 2018-02-21 MED ORDER — ATORVASTATIN CALCIUM 20 MG PO TABS
40.0000 mg | ORAL_TABLET | Freq: Every day | ORAL | Status: DC
  Administered 2018-02-21: 40 mg via ORAL
  Filled 2018-02-21: qty 2

## 2018-02-21 MED ORDER — ACETAMINOPHEN 325 MG PO TABS
650.0000 mg | ORAL_TABLET | Freq: Four times a day (QID) | ORAL | Status: DC | PRN
  Administered 2018-02-21: 650 mg via ORAL
  Filled 2018-02-21: qty 2

## 2018-02-21 MED ORDER — ONDANSETRON HCL 4 MG/2ML IJ SOLN: 4.0000 mg | Freq: Four times a day (QID) | INTRAMUSCULAR | Status: DC | PRN

## 2018-02-21 MED ORDER — FUROSEMIDE 10 MG/ML IJ SOLN
20.0000 mg | Freq: Two times a day (BID) | INTRAMUSCULAR | Status: DC
  Administered 2018-02-21 – 2018-02-22 (×2): 20 mg via INTRAVENOUS
  Filled 2018-02-21 (×2): qty 4

## 2018-02-21 MED ORDER — ASPIRIN EC 81 MG PO TBEC
81.0000 mg | DELAYED_RELEASE_TABLET | Freq: Every day | ORAL | Status: DC
  Administered 2018-02-21 – 2018-02-22 (×2): 81 mg via ORAL
  Filled 2018-02-21 (×2): qty 1

## 2018-02-21 MED ORDER — MELATONIN 5 MG PO TABS
5.0000 mg | ORAL_TABLET | Freq: Every evening | ORAL | Status: DC | PRN
  Administered 2018-02-21: 5 mg via ORAL
  Filled 2018-02-21 (×2): qty 1

## 2018-02-21 MED ORDER — OLANZAPINE 5 MG PO TABS
2.5000 mg | ORAL_TABLET | Freq: Every day | ORAL | Status: DC
  Administered 2018-02-21: 2.5 mg via ORAL
  Filled 2018-02-21: qty 1

## 2018-02-21 MED ORDER — CALCIUM CARBONATE-VITAMIN D 500-200 MG-UNIT PO TABS
2.0000 | ORAL_TABLET | Freq: Every day | ORAL | Status: DC
  Administered 2018-02-22: 2 via ORAL
  Filled 2018-02-21 (×2): qty 2

## 2018-02-21 MED ORDER — METOPROLOL SUCCINATE ER 25 MG PO TB24
25.0000 mg | ORAL_TABLET | Freq: Every day | ORAL | Status: DC
  Administered 2018-02-21 – 2018-02-22 (×2): 25 mg via ORAL
  Filled 2018-02-21 (×2): qty 1

## 2018-02-21 MED ORDER — VITAMIN D 1000 UNITS PO TABS
1000.0000 [IU] | ORAL_TABLET | Freq: Every day | ORAL | Status: DC
  Administered 2018-02-21 – 2018-02-22 (×2): 1000 [IU] via ORAL
  Filled 2018-02-21 (×2): qty 1

## 2018-02-21 NOTE — H&P (Addendum)
[place] [facility] [place] NAME: Danny Grimes    MR#:  [id number]  DATE OF BIRTH:  03-Aug-1928  DATE OF ADMISSION:  02/21/2018  PRIMARY CARE PHYSICIAN: System, Pcp Not In   REQUESTING/REFERRING PHYSICIAN:  Orbie Pyo, MD     CHIEF COMPLAINT:   Chief Complaint  Patient presents with  . Generalized Body Aches    HISTORY OF PRESENT ILLNESS: Danny Grimes  is a 84 y.o. male with a known history of  Parkinson's disease, chronic abdominal pain, history of congestive heart failure type unknown, previous CVA, essential hypertension , hyperlipidemia and Parkinson's disease presenting with generalized body aches and weakness.  He reports he had fevers a few days ago.  And felt like he had the flu.  He denies any chest pain or palpitations denies any nausea vomiting complains of chronic abdominal pain    PAST MEDICAL HISTORY:   Past Medical History:  Diagnosis Date  . Adenocarcinoma ([place])   . Anxiety   . Bilateral renal masses   . CHF (congestive heart failure) ([place])   . Colon polyp   . Degenerative joint disease   . Depression   . DNR (do not resuscitate)   . Fitting and adjustment of cardiac pacemaker   . Hemiparesis affecting left side as late effect of cerebrovascular accident (CVA) ([place])   . Hyperlipemia   . Hypertension   . Parkinson disease ([place])   . Paroxysmal atrial fibrillation (HCC)   . Peripheral arterial disease ([place])   . Skin cancer     PAST SURGICAL HISTORY:  Past Surgical History:  Procedure Laterality Date  . insert / replace/ remove peacemaker      SOCIAL HISTORY:  Social History   Tobacco Use  . Smoking status: Former Research scientist (life sciences)  . Smokeless tobacco: Never Used  Substance Use Topics  . Alcohol use: No    FAMILY HISTORY: History reviewed. No pertinent family history.  DRUG ALLERGIES:  Allergies  Allergen Reactions  . Bee Venom     REVIEW OF SYSTEMS:   CONSTITUTIONAL: No fever, positive fatigue or  positive weakness.  EYES: No blurred or double vision.  EARS, NOSE, AND THROAT: No tinnitus or ear pain.  RESPIRATORY: No cough, shortness of breath, wheezing or hemoptysis.  CARDIOVASCULAR: No chest pain, orthopnea, edema.  GASTROINTESTINAL: No nausea, vomiting, diarrhea or abdominal pain.  GENITOURINARY: No dysuria, hematuria.  ENDOCRINE: No polyuria, nocturia,  HEMATOLOGY: No anemia, easy bruising or bleeding SKIN: No rash or lesion. MUSCULOSKELETAL: No joint pain or arthritis.   NEUROLOGIC: No tingling, numbness, weakness.  PSYCHIATRY: No anxiety or depression.   MEDICATIONS AT HOME:  Prior to Admission medications   Medication Sig Start Date End Date Taking? Authorizing Provider  acetaminophen (TYLENOL) 325 MG tablet Take 650 mg by mouth. Take 650mg  by mouth 3 times daily (scheduled) and 650mg  by mouth every 6 hours (as needed)   Yes [provider]  apixaban (ELIQUIS) 5 MG TABS tablet Take 5 mg by mouth 2 (two) times daily.    Yes [provider]  aspirin EC 81 MG tablet Take 81 mg by mouth daily.   Yes [provider]  atorvastatin (LIPITOR) 40 MG tablet Take 40 mg by mouth daily.   Yes [provider]  Calcium Carbonate-Vitamin D 600-400 MG-UNIT tablet Take 2 tablets by mouth daily with breakfast.   Yes [provider]  carbidopa-levodopa (SINEMET IR) 25-100 MG tablet Take 2 tablets by mouth 3 (three) times  daily.    Yes [provider]  Cholecalciferol 10000 units TABS Take 1,000 Units by mouth daily.    Yes [provider]  DULoxetine (CYMBALTA) 30 MG capsule Take 90 mg by mouth daily.   Yes [provider]  finasteride (PROSCAR) 5 MG tablet Take 5 mg by mouth every other day.    Yes [provider]  folic acid (FOLVITE) 1 MG tablet Take 1 mg by mouth daily.   Yes [provider]  Melatonin 3 MG TABS Take 6 mg by mouth at bedtime as needed (sleep).   Yes [provider]  metoprolol  succinate (TOPROL-XL) 25 MG 24 hr tablet Take 25 mg by mouth daily.   Yes [provider]  OLANZapine (ZYPREXA) 2.5 MG tablet Take 2.5 mg by mouth at bedtime.   Yes [provider]  pantoprazole (PROTONIX) 40 MG tablet Take 40 mg by mouth 2 (two) times daily.   Yes [provider]  Petrolatum-Zinc Oxide (SENSI-CARE PROTECTIVE BARRIER EX) Apply 1 application topically 2 (two) times daily.   Yes [provider]  polyethylene glycol (MIRALAX / GLYCOLAX) packet Take 17 g by mouth daily.   Yes [provider]  senna-docusate (SENOKOT-S) 8.6-50 MG tablet Take 1 tablet by mouth 2 (two) times daily.   Yes [provider]  terazosin (HYTRIN) 5 MG capsule Take 5 mg by mouth at bedtime.   Yes [provider]  vitamin B-12 (CYANOCOBALAMIN) 1000 MCG tablet Take 1,000 mcg by mouth daily.   Yes [provider]  tamsulosin (FLOMAX) 0.4 MG CAPS capsule Take 1 capsule (0.4 mg total) by mouth every morning. Patient not taking: Reported on 02/19/2017 05/29/16   Lorin Picket, PA-C      PHYSICAL EXAMINATION:   VITAL SIGNS: Blood pressure 112/64, pulse 81, temperature 97.6 F (36.4 C), temperature source Oral, resp. rate (!) 26, weight 185 lb (83.9 kg), SpO2 100 %.  GENERAL:  84 y.o.-year-old patient lying in the bed with no acute distress.  EYES: Pupils equal, round, reactive to light and accommodation. No scleral icterus. Extraocular muscles intact.  HEENT: Head atraumatic, normocephalic. Oropharynx and nasopharynx clear.  NECK:  Supple, no jugular venous distention. No thyroid enlargement, no tenderness.  LUNGS: Occasional crackles at the base  cARDIOVASCULAR: S1, S2 normal.  Positive murmurs, rubs, or gallops.  ABDOMEN: Soft, nontender, nondistended. Bowel sounds present. No organomegaly or mass.  EXTREMITIES: 1+ pedal edema, cyanosis, or clubbing.  NEUROLOGIC: Cranial nerves II through XII are intact. Muscle strength 5/5 in all  extremities. Sensation intact. Gait not checked.  PSYCHIATRIC: The patient is alert and oriented x 3.  SKIN: No obvious rash, lesion, or ulcer.   LABORATORY PANEL:   CBC Recent Labs  Lab 02/21/18 0624  WBC 7.3  HGB 10.0*  HCT 30.4*  PLT 346  MCV 85.8  MCH 28.2  MCHC 32.9  RDW 16.6*   ------------------------------------------------------------------------------------------------------------------  Chemistries  Recent Labs  Lab 02/21/18 0624  NA 140  K 3.6  CL 104  CO2 27  GLUCOSE 133*  BUN 24*  CREATININE 1.02  CALCIUM 9.0   ------------------------------------------------------------------------------------------------------------------ estimated creatinine clearance is 56 mL/min (by C-G formula based on SCr of 1.02 mg/dL). ------------------------------------------------------------------------------------------------------------------ No results for input(s): TSH, T4TOTAL, T3FREE, THYROIDAB in the last 72 hours.  Invalid input(s): FREET3   Coagulation profile No results for input(s): INR, PROTIME in the last 168 hours. ------------------------------------------------------------------------------------------------------------------- No results for input(s): DDIMER in the last 72 hours. -------------------------------------------------------------------------------------------------------------------  Cardiac Enzymes  No results for input(s): CKMB, TROPONINI, MYOGLOBIN in the last 168 hours.  Invalid input(s): CK ------------------------------------------------------------------------------------------------------------------ Invalid input(s): POCBNP  ---------------------------------------------------------------------------------------------------------------  Urinalysis    Component Value Date/Time   COLORURINE YELLOW (A) 02/21/2018 0624   APPEARANCEUR CLEAR (A) 02/21/2018 0624   LABSPEC 1.018 02/21/2018 0624   PHURINE 6.0 02/21/2018 0624   GLUCOSEU  NEGATIVE 02/21/2018 0624   HGBUR NEGATIVE 02/21/2018 0624   BILIRUBINUR NEGATIVE 02/21/2018 0624   KETONESUR 5 (A) 02/21/2018 0624   PROTEINUR NEGATIVE 02/21/2018 0624   NITRITE NEGATIVE 02/21/2018 0624   LEUKOCYTESUR NEGATIVE 02/21/2018 0624     RADIOLOGY: Dg Chest Portable 1 View  Result Date: 02/21/2018 CLINICAL DATA:  Shortness of breath and chest pain EXAM: PORTABLE CHEST 1 VIEW COMPARISON:  October 23, 2017 FINDINGS: There is cardiomegaly with pulmonary venous hypertension. There is slight interstitial edema. No airspace consolidation. Pacemaker leads are attached the right atrium and right ventricle. There is aortic atherosclerosis. No adenopathy. No evident bone lesions. IMPRESSION: Pulmonary vascular congestion with mild interstitial edema. No consolidation. Pacemaker leads attached to right atrium and right ventricle. There is aortic atherosclerosis. Aortic Atherosclerosis (ICD10-I70.0). Electronically Signed   By: Lowella Grip III M.D.   On: 02/21/2018 07:02    EKG: Orders placed or performed during the hospital encounter of 02/21/18  . ED EKG  . ED EKG  . EKG 12-Lead  . EKG 12-Lead    IMPRESSION AND PLAN: Patient is a 84 year old white male presenting with generalized weakness body aches  1.  Generalized weakness and body aches possibly related to flu We will check the flu test Also has worsening anemia could be contributing to some of the symptoms  2.  Anemia: Guaiac stools Hemoglobin is not that significantly low no need for transfusion Do anemia workup  3.  Acute CHF type unknown I will give patient  low-dose IV Lasix follow renal function Check echocardiogram of the heart  4.  Paroxysmal atrial fibrillation we will continue Eliquis unless his anemia worsens Continue metoprolol  5.  Consents disease continue carbidopa levodopa, Zyprexa  6.  GERD continue Protonix  7.  Hyperlipidemia continue Lipitor  8.  CODE STATUS: In 1 of the problem list and his  past history is as DNR, however patient unable to confirm this, no documentation provided from the facility where he is so at this point he will be a full code, I also called his contact number is son he did not pick up the phone   All the records are reviewed and case discussed with ED provider. Management plans discussed with the patient, family and they are in agreement.  CODE STATUS: Code Status History    This patient does not have a recorded code status. Please follow your organizational policy for patients in this situation.       TOTAL TIME TAKING CARE OF THIS PATIENT:55 minutes.    Dustin Flock M.D on 02/21/2018 at 9:35 AM  Between 7am to 6pm - Pager - [phone]  After 6pm go to [email or web address] - password [email or web address] [facility]  Office  [phone]  CC: Primary care physician; System, Pcp Not In

## 2018-02-21 NOTE — Care Management Obs Status (Signed)
[place] NOTIFICATION   Patient Details  Name: Danny Grimes MRN: [id number] Date of Birth: [birth year]   Medicare Observation Status Notification Given:  Yes    Jolly Mango, RN 02/21/2018, 2:03 PM

## 2018-02-21 NOTE — Progress Notes (Signed)
Notified Dr. Dustin Flock that patient received 40 mg IV lasix at 0744 in the ED and has Lasix 20mg  IV scheduled at 10 am. Order received to hold this dose and give the evening dose as scheduled. Continue off unit telemetry.

## 2018-02-21 NOTE — Progress Notes (Signed)
Note written on wrong patient.

## 2018-02-21 NOTE — Progress Notes (Signed)
*  PRELIMINARY RESULTS* Echocardiogram 2D Echocardiogram has been performed.  Danny Grimes 02/21/2018, 1:37 PM

## 2018-02-21 NOTE — ED Provider Notes (Signed)
[facility] Emergency Department Provider Note  ___________________________________________   First MD Initiated Contact with Patient 02/21/18 0703     (approximate)  I have reviewed the triage vital signs and the nursing notes.   HISTORY  Chief Complaint Generalized Body Aches   HPI Danny Grimes is a 84 y.o. male with a history of Parkinson's disease, atrial fibrillation on Eliquis as well as depression and CHF who is presenting to the emergency department with 1 week of worsening swelling around his eyes as well as body aches especially to his upper back.  Patient also says that he has had increased weakness.  Denies any blood in his stool.  Denies any chest pain or shortness of breath.  Past Medical History:  Diagnosis Date  . Adenocarcinoma ([place])   . Anxiety   . Bilateral renal masses   . CHF (congestive heart failure) ([place])   . Colon polyp   . Degenerative joint disease   . Depression   . DNR (do not resuscitate)   . Fitting and adjustment of cardiac pacemaker   . Hemiparesis affecting left side as late effect of cerebrovascular accident (CVA) ([place])   . Hyperlipemia   . Hypertension   . Parkinson disease ([place])   . Paroxysmal atrial fibrillation (HCC)   . Peripheral arterial disease ([place])   . Skin cancer     Patient Active Problem List   Diagnosis Date Noted  . Dementia in Parkinson's disease ([place]) 06/24/2016  . CHF (congestive heart failure) ([place]) 06/24/2016  . A-fib ([place]) 06/24/2016  . HTN (hypertension) 06/24/2016  . Hyperlipidemia 06/24/2016    Past Surgical History:  Procedure Laterality Date  . insert / replace/ remove peacemaker      Prior to Admission medications   Medication Sig Start Date End Date Taking? Authorizing Provider  ACETAMINOPHEN PO Take 500-650 mg by mouth as directed. Take 650mg  by mouth three times daily as needed for pain - may take 500mg  by mouth as every six hours as needed for pain/fever    [provider]  apixaban (ELIQUIS) 5 MG TABS tablet Take 5 mg by mouth 2 (two) times daily.     [provider]  aspirin EC 81 MG tablet Take 81 mg by mouth daily.    [provider]  atorvastatin (LIPITOR) 40 MG tablet Take 40 mg by mouth daily.    [provider]  Calcium Carbonate-Vitamin D 600-400 MG-UNIT tablet Take 2 tablets by mouth daily with breakfast.    [provider]  carbidopa-levodopa (SINEMET IR) 25-100 MG tablet Take 2 tablets by mouth 3 (three) times daily.     [provider]  Cholecalciferol 10000 units TABS Take 1,000 Units by mouth daily.     [provider]  DULoxetine (CYMBALTA) 30 MG capsule Take 90 mg by mouth daily.    [provider]  finasteride (PROSCAR) 5 MG tablet Take 5 mg by mouth every other day.     [provider]  fluticasone (FLONASE) 50 MCG/ACT nasal spray Place 2 sprays into both nostrils daily.    [provider]  folic acid (FOLVITE) 1 MG tablet Take 1 mg by mouth daily.    [provider]  furosemide (LASIX) 20 MG tablet Take 20 mg by mouth daily as needed for fluid.    [provider]  guaiFENesin (ROBITUSSIN) 100 MG/5ML liquid Take 200 mg by mouth every 6 (six) hours as needed for cough. Max 4 doses per 24  hours    [provider]  hydroxypropyl methylcellulose / hypromellose (ISOPTO TEARS / GONIOVISC) 2.5 % ophthalmic solution Place 2 drops into both eyes every 3 (three) hours as needed for dry eyes.    [provider]  ketotifen (ZADITOR) 0.025 % ophthalmic solution Place 1 drop into both eyes 2 (two) times daily as needed.    [provider]  loperamide (IMODIUM) 2 MG capsule Take 2 mg by mouth as needed for diarrhea or loose stools. With each loose stool - max 8 doses in 24 hours    [provider]  loratadine (CLARITIN) 10 MG tablet Take 10 mg by mouth daily.    [provider]  magnesium hydroxide (MILK OF  MAGNESIA) 400 MG/5ML suspension Take 30 mLs by mouth at bedtime as needed for mild constipation.    [provider]  Melatonin 3 MG TABS Take 6 mg by mouth at bedtime as needed (sleep).    [provider]  Menthol-Methyl Salicylate (BEN GAY GREASELESS) 10-15 % greaseless cream Apply 1 application topically 3 (three) times daily as needed for pain.    [provider]  metoprolol succinate (TOPROL-XL) 25 MG 24 hr tablet Take 25 mg by mouth daily.    [provider]  neomycin-bacitracin-polymyxin (NEOSPORIN) [phone] ointment Apply 1 application topically as directed.    [provider]  OLANZapine (ZYPREXA) 5 MG tablet Take 2.5 mg (half tablet) each morning and 5 mg (1 tablet) each night at bedtime. Patient taking differently: Take 2.5 mg by mouth at bedtime.  06/24/16   Carrie Mew, MD  Omega-3 1000 MG CAPS Take 2,000 mg by mouth daily.    [provider]  pantoprazole (PROTONIX) 40 MG tablet Take 40 mg by mouth 2 (two) times daily.    [provider]  psyllium (HYDROCIL/METAMUCIL) 95 % PACK Take 1 packet by mouth daily.    [provider]  salicyclic acid-sulfur (SEBULEX) 2-2 % shampoo Apply 1 application topically every Monday, Wednesday, and Friday.    [provider]  senna-docusate (SENOKOT-S) 8.6-50 MG tablet Take 1 tablet by mouth 2 (two) times daily.    [provider]  tamsulosin (FLOMAX) 0.4 MG CAPS capsule Take 1 capsule (0.4 mg total) by mouth every morning. Patient not taking: Reported on 02/19/2017 05/29/16   Lorin Picket, PA-C  terazosin (HYTRIN) 5 MG capsule Take 5 mg by mouth at bedtime.    [provider]  vitamin B-12 (CYANOCOBALAMIN) 1000 MCG tablet Take 1,000 mcg by mouth daily.    [provider]    Allergies Bee venom  History reviewed. No pertinent family history.  Social History Social History   Tobacco Use  . Smoking status: Former Research scientist (life sciences)  .  Smokeless tobacco: Never Used  Substance Use Topics  . Alcohol use: No  . Drug use: No    Review of Systems  Constitutional: No fever/chills Eyes: No visual changes. ENT: No sore throat. Cardiovascular: Denies chest pain. Respiratory: Denies shortness of breath. Gastrointestinal: No abdominal pain.  No nausea, no vomiting.  No diarrhea.  No constipation. Genitourinary: Negative for dysuria. Musculoskeletal: As above Skin: Negative for rash. Neurological: Negative for headaches, focal weakness or numbness.   ____________________________________________   PHYSICAL EXAM:  VITAL SIGNS: ED Triage Vitals  Enc Vitals Group     BP 02/21/18 0623 (!) 151/71     Pulse Rate 02/21/18 0623 81     Resp 02/21/18 0623 (!) 21     Temp 02/21/18  0623 97.6 F (36.4 C)     Temp Source 02/21/18 0623 Oral     SpO2 02/21/18 0623 97 %     Weight 02/21/18 0624 185 lb (83.9 kg)     Height --      Head Circumference --      Peak Flow --      Pain Score --      Pain Loc --      Pain Edu? --      Excl. in [place]? --     Constitutional: Alert and oriented. Well appearing and in no acute distress. Eyes: Conjunctivae are normal.  Edema periorbitally.  No erythema or induration. Head: Atraumatic. Nose: No congestion/rhinnorhea. Mouth/Throat: Mucous membranes are moist.  Neck: No stridor.   Cardiovascular: Normal rate, regular rhythm. Grossly normal heart sounds.   Respiratory: Normal respiratory effort.  No retractions. Lungs CTAB. Gastrointestinal: Soft and nontender. No distention. No CVA tenderness. Heme positive brown stool.  Also with multiple external hemorrhoids which are non-engorged. Musculoskeletal: No lower extremity tenderness but with mild edema to the bilateral lower extremities.  No joint effusions. Neurologic:  Normal speech and language. No gross focal neurologic deficits are appreciated. Skin:  Skin is warm, dry and intact. No rash noted. Psychiatric: Mood and affect are normal.  Speech and behavior are normal.  ____________________________________________   LABS (all labs ordered are listed, but only abnormal results are displayed)  Labs Reviewed  BASIC METABOLIC PANEL - Abnormal; Notable for the following components:      Result Value   Glucose, Bld 133 (*)    BUN 24 (*)    All other components within normal limits  CBC - Abnormal; Notable for the following components:   RBC 3.54 (*)    Hemoglobin 10.0 (*)    HCT 30.4 (*)    RDW 16.6 (*)    All other components within normal limits  URINALYSIS, COMPLETE (UACMP) WITH MICROSCOPIC  TROPONIN I  CK   ____________________________________________  EKG  ED ECG REPORT I, Doran Stabler, the attending physician, personally viewed and interpreted this ECG.   Date: 02/21/2018  EKG Time: 0626  Rate: 76  Rhythm: Ventricular paced rhythm  Axis: Normal for pacing  Intervals:Wide-complex consistent with ventricular pacing  ST&T Change: ST segment elevations consistent with ventricular pacing.  T wave inversions in V5 and V6.  T wave inversions in aVL.  ____________________________________________  RADIOLOGY  Chest x-ray with pulmonary vascular congestion with mild interstitial edema. ____________________________________________   PROCEDURES  Procedure(s) performed:   Procedures  Critical Care performed:   ____________________________________________   INITIAL IMPRESSION / ASSESSMENT AND PLAN / ED COURSE  Pertinent labs & imaging results that were available during my care of the patient were reviewed by me and considered in my medical decision making (see chart for details).  DDX: Electrolyte abnormality, ACS, rhabdomyolysis, GI bleeding, symptomatic anemia, anasarca, CHF As part of my medical decision making, I reviewed the following data within the [place] chart reviewed  ----------------------------------------- 8:11 AM on  02/21/2018 -----------------------------------------  Patient dropped his hemoglobin almost 2 g over the past month and is heme positive on Eliquis.  Will be admitted for further workup and observation.  Signed out to Dr. Darvin Neighbours.  Given Lasix for anasarca.  Patient aware of need for admission. ____________________________________________   FINAL CLINICAL IMPRESSION(S) / ED DIAGNOSES  GI bleeding.  Anemia.  Anasarca.  CHF.    NEW MEDICATIONS STARTED DURING THIS VISIT:  New Prescriptions  No medications on file     Note:  This document was prepared using Dragon voice recognition software and may include unintentional dictation errors.     Orbie Pyo, MD 02/21/18 [phone]

## 2018-02-21 NOTE — ED Triage Notes (Signed)
Pt arrived via EMS from [place] where staff reported that pt woke this AM and requested to come to ED due to pain all over. Staff reported pt having more swelling to face than his usual from his hyperlipidemia. Pt is A&O to person, place and situation.

## 2018-02-21 NOTE — ED Notes (Signed)
The patient stated that he was wet, changed his adult diaper and performed a full linen change. Pt alert and oriented to all.

## 2018-02-21 NOTE — Evaluation (Signed)
Physical Therapy Evaluation Patient Details Name: Danny Grimes MRN: [id number] DOB: 1928/08/13 Today's Date: 02/21/2018   History of Present Illness  Pt is a 84 y/o M who presented from ALF with reports of feeling like he had the flu with generalized body aches and weakness. Pt reports chronic abdominal pain.  Pt's PMH inculdes Parkinson's disease, CVA, CHF, skin cancer, pacemaker.    Clinical Impression  Pt admitted with above diagnosis. Pt currently with functional limitations due to the deficits listed below (see PT Problem List). Danny Grimes currently is able to perform sit<>stand with min guard assist for safety and ambulated in room 50 ft with RW with min guard assist for safety.  Ambulatory distance limited due to back pain which is chronic.  Pt appears close to his baseline level of mobility.  Suspect once he is treated for his medical presentation he will be appropriate to d/c back to his ALF.  Pt will benefit from skilled PT to increase their independence and safety with mobility to allow discharge to the venue listed below.      Follow Up Recommendations Home health PT    Equipment Recommendations  None recommended by PT    Recommendations for Other Services       Precautions / Restrictions Precautions Precautions: Fall Precaution Comments: Very HOH Restrictions Weight Bearing Restrictions: No      Mobility  Bed Mobility Overal bed mobility: Modified Independent             General bed mobility comments: No cues or physical assist required.  Pt requires increased time.   Transfers Overall transfer level: Needs assistance Equipment used: Rolling walker (2 wheeled) Transfers: Sit to/from Stand Sit to Stand: Min guard         General transfer comment: Pt requires several attempt to stand from bed, using his momentum.  Pt with proper hand placement for sit<>stand.  Well controlled descent to sit.   Ambulation/Gait Ambulation/Gait assistance: Min  guard Ambulation Distance (Feet): 50 Feet Assistive device: Rolling walker (2 wheeled) Gait Pattern/deviations: Step-through pattern;Decreased stride length;Trunk flexed   Gait velocity interpretation: at or above normal speed for age/gender General Gait Details: Ambulatory distance limited due to back pain.  No signs of instability using RW.  Flexed posture.    Stairs            Wheelchair Mobility    Modified Rankin (Stroke Patients Only)       Balance Overall balance assessment: Needs assistance Sitting-balance support: No upper extremity supported;Feet supported Sitting balance-Leahy Scale: Good     Standing balance support: No upper extremity supported;During functional activity Standing balance-Leahy Scale: Fair Standing balance comment: Pt able to perform static standing without UE support but relies on UE support for dynamic activity                             Pertinent Vitals/Pain Pain Assessment: Faces Faces Pain Scale: Hurts even more Pain Location: chronic back pain, neck pain, stomach pain Pain Descriptors / Indicators: Aching;Grimacing;Discomfort Pain Intervention(s): Limited activity within patient's tolerance;Monitored during session;Repositioned    Home Living Family/patient expects to be discharged to:: Assisted living               Home Equipment: Walker - 2 wheels;Wheelchair - manual(unsure if WC belongs to pt or his ALF)      Prior Function Level of Independence: Needs assistance   Gait / Transfers Assistance Needed: Pt independent amulator  at ALF.  He ambulates with RW at all times and denies any falls in the past 6 months.  Ambulates to cafeteria which pt reports is ~40 yards away.  Pt does report that he uses the WC at times but this might just be recently due to his not feeling well.   ADL's / Homemaking Assistance Needed: Pt dresses independently at baseline.  He requires assist for bathing (gets in the shower).  Meals  provided by ALF.         Hand Dominance        Extremity/Trunk Assessment   Upper Extremity Assessment Upper Extremity Assessment: (BUE strength grossly 4/5)    Lower Extremity Assessment Lower Extremity Assessment: (BLE strength grossly 4/5)    Cervical / Trunk Assessment Cervical / Trunk Assessment: Kyphotic;Other exceptions Cervical / Trunk Exceptions: h/o chronic back pain  Communication   Communication: HOH(Very HOH)  Cognition Arousal/Alertness: Awake/alert Behavior During Therapy: WFL for tasks assessed/performed Overall Cognitive Status: Difficult to assess                                        General Comments      Exercises     Assessment/Plan    PT Assessment Patient needs continued PT services  PT Problem List Decreased strength;Decreased activity tolerance;Decreased balance;Decreased knowledge of use of DME;Decreased safety awareness;Pain       PT Treatment Interventions DME instruction;Gait training;Functional mobility training;Therapeutic activities;Therapeutic exercise;Balance training;Neuromuscular re-education;Patient/family education    PT Goals (Current goals can be found in the Care Plan section)  Acute Rehab PT Goals Patient Stated Goal: to feel better and return to PLOF PT Goal Formulation: With patient Time For Goal Achievement: 03/07/18 Potential to Achieve Goals: Good    Frequency Min 2X/week   Barriers to discharge        Co-evaluation               AM-PAC PT "6 Clicks" Daily Activity  Outcome Measure Difficulty turning over in bed (including adjusting bedclothes, sheets and blankets)?: A Little Difficulty moving from lying on back to sitting on the side of the bed? : A Little Difficulty sitting down on and standing up from a chair with arms (e.g., wheelchair, bedside commode, etc,.)?: A Lot Help needed moving to and from a bed to chair (including a wheelchair)?: A Little Help needed walking in  hospital room?: A Little Help needed climbing 3-5 steps with a railing? : A Lot 6 Click Score: 16    End of Session Equipment Utilized During Treatment: Gait belt Activity Tolerance: Patient tolerated treatment well Patient left: in chair;with call bell/phone within reach;with chair alarm set;with SCD's reapplied Nurse Communication: Mobility status PT Visit Diagnosis: Unsteadiness on feet (R26.81);Other abnormalities of gait and mobility (R26.89);Muscle weakness (generalized) (M62.81);Pain Pain - Right/Left: (lower) Pain - part of body: (back)    Time: 5284-1324 PT Time Calculation (min) (ACUTE ONLY): 26 min   Charges:   PT Evaluation $PT Eval Low Complexity: 1 Low PT Treatments $Gait Training: 8-22 mins   PT G Codes:        Collie Siad PT, DPT 02/21/2018, 11:36 AM

## 2018-02-21 NOTE — NC FL2 (Addendum)
[place] LEVEL OF CARE SCREENING TOOL     IDENTIFICATION  Patient Name: Danny Grimes Birthdate: 09-Dec-1928 Sex: male Admission Date (Current Location): 02/21/2018  [place] and [place] Number:  Engineering geologist and Address:  [facility], [address], [place],  [address]      Provider Number: [id number]  Attending Physician Name and Address:  Dustin Flock, MD  Relative Name and Phone Number:       Current Level of Care: Hospital Recommended Level of Care: [place] Prior Approval Number:    Date Approved/Denied:   PASRR Number: [id number])  Discharge Plan: Domiciliary (Rest home)    Current Diagnoses: Patient Active Problem List   Diagnosis Date Noted  . Generalized weakness 02/21/2018  . Dementia in Parkinson's disease ([place]) 06/24/2016  . CHF (congestive heart failure) ([place]) 06/24/2016  . A-fib ([place]) 06/24/2016  . HTN (hypertension) 06/24/2016  . Hyperlipidemia 06/24/2016    Orientation RESPIRATION BLADDER Height & Weight     Self, Time, Place  Normal Continent Weight: 178 lb 5.6 oz (80.9 kg) Height:  6' (182.9 cm)  BEHAVIORAL SYMPTOMS/MOOD NEUROLOGICAL BOWEL NUTRITION STATUS      Continent Diet(Diet: Soft )  AMBULATORY STATUS COMMUNICATION OF NEEDS Skin   Limited Assist Verbally Normal                       Personal Care Assistance Level of Assistance  Bathing, Feeding, Dressing Bathing Assistance: Limited assistance Feeding assistance: Independent Dressing Assistance: Limited assistance     Functional Limitations Info  Sight, Hearing, Speech Sight Info: Adequate Hearing Info: Adequate Speech Info: Adequate    SPECIAL CARE FACTORS FREQUENCY  PT (By licensed PT)     PT Frequency: (2-3 home health PT. )              Contractures      Additional Factors Info  Code Status, Allergies Code Status Info: (Partial Code. ) Allergies Info: (Bee Venom)            Current Medications (02/21/2018):  This is the current hospital active medication list Current Facility-Administered Medications  Medication Dose Route Frequency Provider Last Rate Last Dose  . acetaminophen (TYLENOL) tablet 650 mg  650 mg Oral Q6H PRN Dustin Flock, MD   650 mg at 02/21/18 1151   Or  . acetaminophen (TYLENOL) suppository 650 mg  650 mg Rectal Q6H PRN Dustin Flock, MD      . acetaminophen (TYLENOL) tablet 650 mg  650 mg Oral Q6H PRN Dustin Flock, MD      . apixaban [facility]ne [place]) tablet 5 mg  5 mg Oral BID Dustin Flock, MD   5 mg at 02/21/18 1147  . aspirin EC tablet 81 mg  81 mg Oral Daily Dustin Flock, MD   81 mg at 02/21/18 1146  . atorvastatin (LIPITOR) tablet 40 mg  40 mg Oral q1800 Dustin Flock, MD      . Derrill Memo ON 02/22/2018] calcium-vitamin D (OSCAL WITH D) 500-200 MG-UNIT per tablet 2 tablet  2 tablet Oral Q breakfast Dustin Flock, MD      . carbidopa-levodopa (SINEMET IR) 25-100 MG per tablet immediate release 2 tablet  2 tablet Oral TID Dustin Flock, MD   2 tablet at 02/21/18 1147  . cholecalciferol (VITAMIN D) tablet 1,000 Units  1,000 Units Oral Daily Dustin Flock, MD   1,000 Units at 02/21/18 1146  . DULoxetine (CYMBALTA) DR capsule 90 mg  90 mg  Oral Daily Dustin Flock, MD   90 mg at 02/21/18 1147  . [START ON 02/22/2018] finasteride (PROSCAR) tablet 5 mg  5 mg Oral Mathis Fare, MD      . folic acid (FOLVITE) tablet 1 mg  1 mg Oral Daily Dustin Flock, MD   1 mg at 02/21/18 1147  . furosemide (LASIX) injection 20 mg  20 mg Intravenous Q12H Dustin Flock, MD      . Melatonin TABS 5 mg  5 mg Oral QHS PRN Dustin Flock, MD      . metoprolol succinate (TOPROL-XL) 24 hr tablet 25 mg  25 mg Oral Daily Dustin Flock, MD   25 mg at 02/21/18 1147  . OLANZapine (ZYPREXA) tablet 2.5 mg  2.5 mg Oral QHS Dustin Flock, MD      . ondansetron [facility]) tablet 4 mg  4 mg Oral Q6H PRN Dustin Flock, MD       Or  . ondansetron (ZOFRAN)  injection 4 mg  4 mg Intravenous Q6H PRN Dustin Flock, MD      . pantoprazole (PROTONIX) EC tablet 40 mg  40 mg Oral BID Dustin Flock, MD   40 mg at 02/21/18 1150  . Petrolatum-Zinc Oxide 49-15 % OINT   Apply externally BID Dustin Flock, MD      . polyethylene glycol (MIRALAX / GLYCOLAX) packet 17 g  17 g Oral Daily Dustin Flock, MD   17 g at 02/21/18 1147  . senna-docusate (Senokot-S) tablet 1 tablet  1 tablet Oral BID Dustin Flock, MD   1 tablet at 02/21/18 1153  . tamsulosin (FLOMAX) capsule 0.4 mg  0.4 mg Oral q morning - 10a Dustin Flock, MD   0.4 mg at 02/21/18 1146  . terazosin (HYTRIN) capsule 5 mg  5 mg Oral QHS Dustin Flock, MD      . vitamin B-12 (CYANOCOBALAMIN) tablet 1,000 mcg  1,000 mcg Oral Daily Dustin Flock, MD   1,000 mcg at 02/21/18 1146     Discharge Medications: Medication List     TAKE these medications   acetaminophen 325 MG tablet Commonly known as:  TYLENOL Take 650 mg by mouth. Take 650mg  by mouth 3 times daily (scheduled) and 650mg  by mouth every 6 hours (as needed)   apixaban 5 MG Tabs tablet Commonly known as:  ELIQUIS Take 5 mg by mouth 2 (two) times daily.   aspirin EC 81 MG tablet Take 81 mg by mouth daily.   atorvastatin 40 MG tablet Commonly known as:  LIPITOR Take 40 mg by mouth daily.   Calcium Carbonate-Vitamin D 600-400 MG-UNIT tablet Take 2 tablets by mouth daily with breakfast.   carbidopa-levodopa 25-100 MG tablet Commonly known as:  SINEMET IR Take 2 tablets by mouth 3 (three) times daily.   Cholecalciferol 10000 units Tabs Take 1,000 Units by mouth daily.   DULoxetine 30 MG capsule Commonly known as:  CYMBALTA Take 90 mg by mouth daily.   finasteride 5 MG tablet Commonly known as:  PROSCAR Take 5 mg by mouth every other day.   folic acid 1 MG tablet Commonly known as:  FOLVITE Take 1 mg by mouth daily.   furosemide 20 MG tablet Commonly known as:  LASIX Take 1 tablet (20 mg total) by  mouth daily.   Melatonin 3 MG Tabs Take 6 mg by mouth at bedtime as needed (sleep).   metoprolol succinate 25 MG 24 hr tablet Commonly known as:  TOPROL-XL Take 25 mg by mouth daily.  OLANZapine 2.5 MG tablet Commonly known as:  ZYPREXA Take 2.5 mg by mouth at bedtime.   pantoprazole 40 MG tablet Commonly known as:  PROTONIX Take 40 mg by mouth 2 (two) times daily.   polyethylene glycol packet Commonly known as:  MIRALAX / GLYCOLAX Take 17 g by mouth daily.   senna-docusate 8.6-50 MG tablet Commonly known as:  Senokot-S Take 1 tablet by mouth 2 (two) times daily.   SENSI-CARE PROTECTIVE BARRIER EX Apply 1 application topically 2 (two) times daily.   tamsulosin 0.4 MG Caps capsule Commonly known as:  FLOMAX Take 1 capsule (0.4 mg total) by mouth every morning.   terazosin 5 MG capsule Commonly known as:  HYTRIN Take 5 mg by mouth at bedtime.   vitamin B-12 1000 MCG tablet Commonly known as:  CYANOCOBALAMIN Take 1,000 mcg by mouth daily.       Relevant Imaging Results:  Relevant Lab Results:   Additional Information (SSN: [id number])  Sample, Veronia Beets, LCSW

## 2018-02-21 NOTE — Progress Notes (Signed)
Advanced care plan.  Purpose of the Encounter: CODE STATUS  Parties in Attendance: d/w patient son on phone Danny Grimes, [phone] Patient's Decision Capacity: not intact due to dementia  Subjective/Patient's story: She is 84 year old history of Parkinson's disease, anxiety and congestive heart failure presenting to the ED with pain   Objective/Medical story Discussed with the son regarding patient's CODE STATUS he states that he does not want his father to be on mechanical ventilator he would want him to be resuscitated chest compressions   Goals of care determination:  Partial code no intubation   CODE STATUS: Partial code no intubation   Time spent discussing advanced care planning: 16 minutes

## 2018-02-21 NOTE — Care Management Note (Signed)
Case Management Note  Patient Details  Name: Danny Grimes MRN: [id number] Date of Birth: January 13, 1928  Subjective/Objective:  Spoke with son, Sherod Cisse by phone to discuss discharge planning. Discussed PT recommendations. Offered choice of home health agencies at [facility] for [place]. Referral to [facility] with [facility] for HHPT. It is anticipated patient will discharge back tomorrow.                   Action/Plan: [facility] for HHPT  Expected Discharge Date:                  Expected Discharge Plan:  Assisted Living / Rest Home  In-House Referral:  Clinical Social Work  Discharge planning Services  CM Consult  Post Acute Care Choice:  Home Health Choice offered to:  Adult Children  DME Arranged:    DME Agency:     HH Arranged:  PT HH Agency:  [place]  Status of Service:  In process, will continue to follow  If discussed at Long Length of Stay Meetings, dates discussed:    Additional Comments:  Jolly Mango, RN 02/21/2018, 2:08 PM

## 2018-02-21 NOTE — ED Notes (Signed)
PT in NAD at time of transfer, VS stable. Pt transferred by Waunita Schooner, [employer]

## 2018-02-21 NOTE — Progress Notes (Signed)
Patient has been alert and oriented this shift but has slept most of the shift. Tylenol given x1 at patient's request for pain "all over." patient voiding incontinent. Up to the chair with PT and was able to get back to bed with standby assistance with a walker.

## 2018-02-21 NOTE — Clinical Social Work Note (Signed)
Clinical Social Work Assessment  Patient Details  Name: Jann Ra MRN: [id number] Date of Birth: [birth year]  Date of referral:  02/21/18               Reason for consult:  Other (Comment Required)(From [facility] )                Permission sought to share information with:  Chartered certified accountant granted to share information::  Yes, Verbal Permission Granted  Name::      [facility]   Agency::     Relationship::     Contact Information:     Housing/Transportation Living arrangements for the past 2 months:  Hall of Information:  Patient, Facility, Adult Children Patient Interpreter Needed:  None Criminal Activity/Legal Involvement Pertinent to Current Situation/Hospitalization:  No - Comment as needed Significant Relationships:  Adult Children Lives with:  Facility Resident Do you feel safe going back to the place where you live?  Yes Need for family participation in patient care:  Yes (Comment)  Care giving concerns:  Patient is a resident at [facility] (fax: [phone]).    Social Worker assessment / plan:  Holiday representative (CSW) received consult that patient is from [facility]. CSW contacted [place] to get additional information. Per Theadora Rama patient has been at [facility] for 1 year, walks with a walker sometimes, and uses a wheel chair often. Per [facility] patient is on room air and can return to the ALF when stable. CSW met with patient and made him aware of above. Patient was hard of hearing but was alert and oriented X4. Patient confirmed that he lives at [place] and his son [name] [phone] is his HPOA and lives in [place]. Patient gave CSW permission to contact his son.   CSW contacted patient's son [name] and made him aware that PT is recommending home health and that patient will likely D/C back to [facility] tomorrow. Son verbalized his  understanding and stated that he would transport patient. RN case manager aware of above. CSW will continue to follow and assist as needed.   Employment status:  Retired Forensic scientist:  Medicare PT Recommendations:  Home with [place] / Referral to community resources:  Other (Comment Required)(Patient will return to [facility] )  Patient/Family's Response to care:  Patient is agreeable to return to [facility].   Patient/Family's Understanding of and Emotional Response to Diagnosis, Current Treatment, and Prognosis:  Patient and his son [name] were very pleasant and thanked CSW for assistance.   Emotional Assessment Appearance:  Appears stated age Attitude/Demeanor/Rapport:    Affect (typically observed):  Pleasant Orientation:  Oriented to Self, Oriented to Place, Fluctuating Orientation (Suspected and/or reported Sundowners), Oriented to  Time Alcohol / Substance use:  Not Applicable Psych involvement (Current and /or in the community):  No (Comment)  Discharge Needs  Concerns to be addressed:  Discharge Planning Concerns Readmission within the last 30 days:  No Current discharge risk:  Chronically ill Barriers to Discharge:  Continued Medical Work up   [employer], Veronia Beets, LCSW 02/21/2018, 1:59 PM

## 2018-02-22 DIAGNOSIS — D649 Anemia, unspecified: Secondary | ICD-10-CM | POA: Diagnosis not present

## 2018-02-22 DIAGNOSIS — I509 Heart failure, unspecified: Secondary | ICD-10-CM | POA: Diagnosis not present

## 2018-02-22 DIAGNOSIS — I4891 Unspecified atrial fibrillation: Secondary | ICD-10-CM | POA: Diagnosis not present

## 2018-02-22 DIAGNOSIS — R531 Weakness: Secondary | ICD-10-CM | POA: Diagnosis not present

## 2018-02-22 LAB — CBC
HEMATOCRIT: 30.2 % — AB (ref 40.0–52.0)
Hemoglobin: 9.8 g/dL — ABNORMAL LOW (ref 13.0–18.0)
MCH: 27.6 pg (ref 26.0–34.0)
MCHC: 32.4 g/dL (ref 32.0–36.0)
MCV: 85.3 fL (ref 80.0–100.0)
PLATELETS: 355 10*3/uL (ref 150–440)
RBC: 3.54 MIL/uL — ABNORMAL LOW (ref 4.40–5.90)
RDW: 16.2 % — AB (ref 11.5–14.5)
WBC: 6.4 10*3/uL (ref 3.8–10.6)

## 2018-02-22 LAB — BASIC METABOLIC PANEL
ANION GAP: 9 (ref 5–15)
BUN: 23 mg/dL — AB (ref 6–20)
CALCIUM: 9.2 mg/dL (ref 8.9–10.3)
CO2: 29 mmol/L (ref 22–32)
CREATININE: 1.09 mg/dL (ref 0.61–1.24)
Chloride: 103 mmol/L (ref 101–111)
GFR calc Af Amer: >60 mL/min (ref >60)
GFR, EST NON AFRICAN AMERICAN: 59 mL/min — AB (ref >60)
GLUCOSE: 113 mg/dL — AB (ref 65–99)
Potassium: 3.6 mmol/L (ref 3.5–5.1)
Sodium: 141 mmol/L (ref 135–145)

## 2018-02-22 MED ORDER — FUROSEMIDE 20 MG PO TABS: 20.0000 mg | ORAL_TABLET | Freq: Every day | ORAL | 11 refills | Status: DC

## 2018-02-22 MED ORDER — FUROSEMIDE 20 MG PO TABS: 20.0000 mg | ORAL_TABLET | Freq: Every day | ORAL | 0 refills | Status: DC

## 2018-02-22 NOTE — Care Management Note (Signed)
Case Management Note  Patient Details  Name: Danny Grimes MRN: [id number] Date of Birth: 08-May-1928  Subjective/Objective:  Referral for HH=PT, RN, Aide, SW was texted to [place] at [facility].                   Action/Plan:   Expected Discharge Date:  02/22/18               Expected Discharge Plan:  [place]  In-House Referral:  Clinical Social Work  Discharge planning Services  CM Consult  Post Acute Care Choice:  Home Health Choice offered to:  Adult Children  DME Arranged:    DME Agency:     HH Arranged:  PT, RN, Nurse's Aide, Social Work [employer] Agency:  [facility]  Status of Service:  Completed, signed off  If discussed at [employer], dates discussed:    Additional Comments:  Maurizio Geno A, RN 02/22/2018, 11:05 AM

## 2018-02-22 NOTE — Discharge Summary (Signed)
[name] [place] NAME: Danny Grimes    MR#:  [id number]  DATE OF BIRTH:  May 12, 1928  DATE OF ADMISSION:  02/21/2018 ADMITTING PHYSICIAN: Danny Flock, MD  DATE OF DISCHARGE: No discharge date for patient encounter.  PRIMARY CARE PHYSICIAN: Danny Grimes, Pcp Not In    ADMISSION DIAGNOSIS:  Anasarca [R60.1] Gastrointestinal hemorrhage, unspecified gastrointestinal hemorrhage type [K92.2] Anemia, unspecified type [D64.9] Congestive heart failure, unspecified HF chronicity, unspecified heart failure type ([place]) [I50.9]  DISCHARGE DIAGNOSIS:  Active Problems:   Generalized weakness   SECONDARY DIAGNOSIS:   Past Medical History:  Diagnosis Date  . Adenocarcinoma ([place])   . Anxiety   . Bilateral renal masses   . CHF (congestive heart failure) ([place])   . Colon polyp   . Degenerative joint disease   . Depression   . DNR (do not resuscitate)   . Fitting and adjustment of cardiac pacemaker   . Hemiparesis affecting left side as late effect of cerebrovascular accident (CVA) ([place])   . Hyperlipemia   . Hypertension   . Parkinson disease ([place])   . Paroxysmal atrial fibrillation (H[facility])   . Peripheral arterial disease ([place])   . Skin cancer     HOSPITAL COURSE:  Patient is a 84 year old white male presenting with generalized weakness body aches  1.  Generalized weakness and body aches possibly related to flu Patient was referred to the observation unit, clinical workup/investigation was unimpressive Echocardiogram done noted for ejection fraction 25-30%  2.  Anemia Secondary to iron deficiency Hemoglobin stable  3.  Acute CHF type unknown Secondary to systolic dysfunction, echocardiogram noted above Treated with Lasix while in house, metoprolol  4.  Paroxysmal atrial fibrillation  Stable  Continue Eliquis and metoprolol  5.    Chronic Parkinson's disease  Stable  Continue carbidopa levodopa, Zyprexa  6.  GERD   continue Protonix  7.  Hyperlipidemia continue Lipitor  DISCHARGE CONDITIONS:  On day of discharge patient is afebrile, he dynamically stable, tolerating diet, ready for discharge home with appropriate follow-up, for more specific details please see chart   CONSULTS OBTAINED:  Treatment Team:  Danny Grimes, Danny Peace, MD  DRUG ALLERGIES:   Allergies  Allergen Reactions  . Bee Venom     DISCHARGE MEDICATIONS:   Allergies as of 02/22/2018      Reactions   Bee Venom       Medication List    TAKE these medications   acetaminophen 325 MG tablet Commonly known as:  TYLENOL Take 650 mg by mouth. Take 650mg  by mouth 3 times daily (scheduled) and 650mg  by mouth every 6 hours (as needed)   apixaban 5 MG Tabs tablet Commonly known as:  ELIQUIS Take 5 mg by mouth 2 (two) times daily.   aspirin EC 81 MG tablet Take 81 mg by mouth daily.   atorvastatin 40 MG tablet Commonly known as:  LIPITOR Take 40 mg by mouth daily.   Calcium Carbonate-Vitamin D 600-400 MG-UNIT tablet Take 2 tablets by mouth daily with breakfast.   carbidopa-levodopa 25-100 MG tablet Commonly known as:  SINEMET IR Take 2 tablets by mouth 3 (three) times daily.   Cholecalciferol 10000 units Tabs Take 1,000 Units by mouth daily.   DULoxetine 30 MG capsule Commonly known as:  CYMBALTA Take 90 mg by mouth daily.   finasteride 5 MG tablet Commonly known as:  PROSCAR Take 5 mg by mouth every other day.   folic acid 1 MG tablet Commonly  known as:  FOLVITE Take 1 mg by mouth daily.   furosemide 20 MG tablet Commonly known as:  LASIX Take 1 tablet (20 mg total) by mouth daily.   Melatonin 3 MG Tabs Take 6 mg by mouth at bedtime as needed (sleep).   metoprolol succinate 25 MG 24 hr tablet Commonly known as:  TOPROL-XL Take 25 mg by mouth daily.   OLANZapine 2.5 MG tablet Commonly known as:  ZYPREXA Take 2.5 mg by mouth at bedtime.   pantoprazole 40 MG tablet Commonly known as:  PROTONIX Take  40 mg by mouth 2 (two) times daily.   polyethylene glycol packet Commonly known as:  MIRALAX / GLYCOLAX Take 17 g by mouth daily.   senna-docusate 8.6-50 MG tablet Commonly known as:  Senokot-S Take 1 tablet by mouth 2 (two) times daily.   SENSI-CARE PROTECTIVE BARRIER EX Apply 1 application topically 2 (two) times daily.   tamsulosin 0.4 MG Caps capsule Commonly known as:  FLOMAX Take 1 capsule (0.4 mg total) by mouth every morning.   terazosin 5 MG capsule Commonly known as:  HYTRIN Take 5 mg by mouth at bedtime.   vitamin B-12 1000 MCG tablet Commonly known as:  CYANOCOBALAMIN Take 1,000 mcg by mouth daily.        DISCHARGE INSTRUCTIONS:    If you experience worsening of your admission symptoms, develop shortness of breath, life threatening emergency, suicidal or homicidal thoughts you must seek medical attention immediately by calling 911 or calling your MD immediately  if symptoms less severe.  You Must read complete instructions/literature along with all the possible adverse reactions/side effects for all the Medicines you take and that have been prescribed to you. Take any new Medicines after you have completely understood and accept all the possible adverse reactions/side effects.   Please note  You were cared for by a hospitalist during your hospital stay. If you have any questions about your discharge medications or the care you received while you were in the hospital after you are discharged, you can call the unit and asked to speak with the hospitalist on call if the hospitalist that took care of you is not available. Once you are discharged, your primary care physician will handle any further medical issues. Please note that NO REFILLS for any discharge medications will be authorized once you are discharged, as it is imperative that you return to your primary care physician (or establish a relationship with a primary care physician if you do not have one) for your  aftercare needs so that they can reassess your need for medications and monitor your lab values.    Today   CHIEF COMPLAINT:   Chief Complaint  Patient presents with  . Generalized Body Aches    HISTORY OF PRESENT ILLNESS:  Parkinson's disease, chronic abdominal pain, history of congestive heart failure type unknown, previous CVA, essential hypertension , hyperlipidemia and Parkinson's disease presenting with generalized body aches and weakness.  He reports he had fevers a few days ago.  And felt like he had the flu.  He denies any chest pain or palpitations denies any nausea vomiting complains of chronic abdominal pain  VITAL SIGNS:  Blood pressure (!) 147/70, pulse 73, temperature 98.8 F (37.1 C), temperature source Oral, resp. rate 19, height 6' (1.829 m), weight 80.9 kg (178 lb 5.6 oz), SpO2 97 %.  I/O:  No intake or output data in the 24 hours ending 02/22/18 1042  PHYSICAL EXAMINATION:  GENERAL:  84 y.o.-year-old  patient lying in the bed with no acute distress.  EYES: Pupils equal, round, reactive to light and accommodation. No scleral icterus. Extraocular muscles intact.  HEENT: Head atraumatic, normocephalic. Oropharynx and nasopharynx clear.  NECK:  Supple, no jugular venous distention. No thyroid enlargement, no tenderness.  LUNGS: Normal breath sounds bilaterally, no wheezing, rales,rhonchi or crepitation. No use of accessory muscles of respiration.  CARDIOVASCULAR: S1, S2 normal. No murmurs, rubs, or gallops.  ABDOMEN: Soft, non-tender, non-distended. Bowel sounds present. No organomegaly or mass.  EXTREMITIES: No pedal edema, cyanosis, or clubbing.  NEUROLOGIC: Cranial nerves II through XII are intact. Muscle strength 5/5 in all extremities. Sensation intact. Gait not checked.  PSYCHIATRIC: The patient is alert and oriented x 3.  SKIN: No obvious rash, lesion, or ulcer.   DATA REVIEW:   CBC Recent Labs  Lab 02/22/18 0353  WBC 6.4  HGB 9.8*  HCT 30.2*  PLT  355    Chemistries  Recent Labs  Lab 02/22/18 0353  NA 141  K 3.6  CL 103  CO2 29  GLUCOSE 113*  BUN 23*  CREATININE 1.09  CALCIUM 9.2    Cardiac Enzymes Recent Labs  Lab 02/21/18 0624  TROPONINI 0.03*    Microbiology Results  Results for orders placed or performed during the hospital encounter of 02/21/18  MRSA PCR Screening     Status: None   Collection Time: 02/21/18 11:54 AM  Result Value Ref Range Status   MRSA by PCR NEGATIVE NEGATIVE Final    Comment:        The GeneXpert MRSA Assay (FDA approved for NASAL specimens only), is one component of a comprehensive MRSA colonization surveillance program. It is not intended to diagnose MRSA infection nor to guide or monitor treatment for MRSA infections. Performed at [facility], [address] [place], [place] [address]     RADIOLOGY:  Dg Chest Portable 1 View  Result Date: 02/21/2018 CLINICAL DATA:  Shortness of breath and chest pain EXAM: PORTABLE CHEST 1 VIEW COMPARISON:  October 23, 2017 FINDINGS: There is cardiomegaly with pulmonary venous hypertension. There is slight interstitial edema. No airspace consolidation. Pacemaker leads are attached the right atrium and right ventricle. There is aortic atherosclerosis. No adenopathy. No evident bone lesions. IMPRESSION: Pulmonary vascular congestion with mild interstitial edema. No consolidation. Pacemaker leads attached to right atrium and right ventricle. There is aortic atherosclerosis. Aortic Atherosclerosis (ICD10-I70.0). Electronically Signed   By: Lowella Grip III M.D.   On: 02/21/2018 07:02    EKG:   Orders placed or performed during the hospital encounter of 02/21/18  . ED EKG  . ED EKG  . EKG 12-Lead  . EKG 12-Lead      Management plans discussed with the patient, family and they are in agreement.  CODE STATUS:     Code Status Orders  (From admission, onward)        Start     Ordered   02/21/18 1044  Limited  resuscitation (code)  Continuous    Question Answer Comment  In the event of cardiac or respiratory ARREST: Initiate Code Blue, Call Rapid Response Yes   In the event of cardiac or respiratory ARREST: Perform CPR Yes   In the event of cardiac or respiratory ARREST: Perform Intubation/Mechanical Ventilation No   In the event of cardiac or respiratory ARREST: Use NIPPV/BiPAp only if indicated Yes   In the event of cardiac or respiratory ARREST: Administer ACLS medications if indicated Yes   In the  event of cardiac or respiratory ARREST: Perform Defibrillation or Cardioversion if indicated Yes   Comments discussed with patient son no intubation      02/21/18 1044    Code Status History    Date Active Date Inactive Code Status Order ID Comments User Context   02/21/2018 10:03 02/21/2018 10:44 DNR [id number]  Danny Flock, MD Inpatient   02/21/2018 09:43 02/21/2018 10:03 Full Code [id number]  Danny Flock, MD ED      TOTAL TIME TAKING CARE OF THIS PATIENT: 45 minutes.    Danny Grimes Katlin Bortner M.D on 02/22/2018 at 10:42 AM  Between 7am to 6pm - Pager - [phone]  After 6pm go to [email or web address] - password [email or web address] [facility]  Office  [phone]  CC: Primary care physician; Danny Grimes, Pcp Not In   Note: This dictation was prepared with Dragon dictation along with smaller phrase technology. Any transcriptional errors that result from this process are unintentional.

## 2018-02-22 NOTE — Progress Notes (Signed)
PT. Is HOH

## 2018-02-22 NOTE — Progress Notes (Signed)
Patient being discharged to [facility].  D/c PIV.  Information in packet to go to [facility].  [name] (son) will be transporting)

## 2018-02-22 NOTE — Clinical Social Work Note (Signed)
The patient will discharge today via family transportation to [place] with resumption of his home health through [facility]. The RNCM is aware. The CSW attempted to contact the patient's son to update and left a HIPPA compliant message. The CSW has delivered the discharge packet. CSW is signing off. Please consult should needs arise.  Santiago Bumpers, MSW, Latanya Presser [phone]

## 2018-02-27 DIAGNOSIS — I480 Paroxysmal atrial fibrillation: Secondary | ICD-10-CM | POA: Diagnosis not present

## 2018-02-27 DIAGNOSIS — G20 Parkinson's disease: Secondary | ICD-10-CM | POA: Diagnosis not present

## 2018-02-27 DIAGNOSIS — I509 Heart failure, unspecified: Secondary | ICD-10-CM | POA: Diagnosis not present

## 2018-02-27 DIAGNOSIS — M1991 Primary osteoarthritis, unspecified site: Secondary | ICD-10-CM | POA: Diagnosis not present

## 2018-02-27 DIAGNOSIS — I130 Hypertensive heart and chronic kidney disease with heart failure and stage 1 through stage 4 chronic kidney disease, or unspecified chronic kidney disease: Secondary | ICD-10-CM | POA: Diagnosis not present

## 2018-02-27 DIAGNOSIS — N182 Chronic kidney disease, stage 2 (mild): Secondary | ICD-10-CM | POA: Diagnosis not present

## 2018-03-03 ENCOUNTER — Ambulatory Visit: Payer: Medicare Other | Admitting: Family

## 2018-03-04 DIAGNOSIS — N182 Chronic kidney disease, stage 2 (mild): Secondary | ICD-10-CM | POA: Diagnosis not present

## 2018-03-04 DIAGNOSIS — I480 Paroxysmal atrial fibrillation: Secondary | ICD-10-CM | POA: Diagnosis not present

## 2018-03-04 DIAGNOSIS — M1991 Primary osteoarthritis, unspecified site: Secondary | ICD-10-CM | POA: Diagnosis not present

## 2018-03-04 DIAGNOSIS — I130 Hypertensive heart and chronic kidney disease with heart failure and stage 1 through stage 4 chronic kidney disease, or unspecified chronic kidney disease: Secondary | ICD-10-CM | POA: Diagnosis not present

## 2018-03-04 DIAGNOSIS — I509 Heart failure, unspecified: Secondary | ICD-10-CM | POA: Diagnosis not present

## 2018-03-04 DIAGNOSIS — G20 Parkinson's disease: Secondary | ICD-10-CM | POA: Diagnosis not present

## 2018-03-06 ENCOUNTER — Other Ambulatory Visit: Payer: Self-pay

## 2018-03-06 ENCOUNTER — Emergency Department
Admission: EM | Admit: 2018-03-06 | Discharge: 2018-03-06 | Disposition: A | Discharge status: Nursing Home (Includes ICF) | Payer: Medicare Other | Attending: Emergency Medicine | Admitting: Emergency Medicine

## 2018-03-06 ENCOUNTER — Emergency Department: Payer: Medicare Other

## 2018-03-06 ENCOUNTER — Encounter: Payer: Self-pay | Admitting: Emergency Medicine

## 2018-03-06 DIAGNOSIS — Z79899 Other long term (current) drug therapy: Secondary | ICD-10-CM | POA: Insufficient documentation

## 2018-03-06 DIAGNOSIS — Z7982 Long term (current) use of aspirin: Secondary | ICD-10-CM | POA: Diagnosis not present

## 2018-03-06 DIAGNOSIS — G20 Parkinson's disease: Secondary | ICD-10-CM | POA: Insufficient documentation

## 2018-03-06 DIAGNOSIS — Z87891 Personal history of nicotine dependence: Secondary | ICD-10-CM | POA: Insufficient documentation

## 2018-03-06 DIAGNOSIS — Z7901 Long term (current) use of anticoagulants: Secondary | ICD-10-CM | POA: Insufficient documentation

## 2018-03-06 DIAGNOSIS — I509 Heart failure, unspecified: Secondary | ICD-10-CM | POA: Insufficient documentation

## 2018-03-06 DIAGNOSIS — Z85828 Personal history of other malignant neoplasm of skin: Secondary | ICD-10-CM | POA: Diagnosis not present

## 2018-03-06 DIAGNOSIS — I110 Hypertensive heart disease with heart failure: Secondary | ICD-10-CM | POA: Insufficient documentation

## 2018-03-06 DIAGNOSIS — R0602 Shortness of breath: Secondary | ICD-10-CM | POA: Diagnosis not present

## 2018-03-06 LAB — COMPREHENSIVE METABOLIC PANEL
ALBUMIN: 3.1 g/dL — AB (ref 3.5–5.0)
ALK PHOS: 58 U/L (ref 38–126)
ALT: 5 U/L — ABNORMAL LOW (ref 17–63)
AST: 17 U/L (ref 15–41)
Anion gap: 12 (ref 5–15)
BILIRUBIN TOTAL: 0.8 mg/dL (ref 0.3–1.2)
BUN: 22 mg/dL — AB (ref 6–20)
CO2: 26 mmol/L (ref 22–32)
Calcium: 8.7 mg/dL — ABNORMAL LOW (ref 8.9–10.3)
Chloride: 103 mmol/L (ref 101–111)
Creatinine, Ser: 1.1 mg/dL (ref 0.61–1.24)
GFR calc Af Amer: >60 mL/min (ref >60)
GFR calc non Af Amer: 58 mL/min — ABNORMAL LOW (ref >60)
GLUCOSE: 131 mg/dL — AB (ref 65–99)
POTASSIUM: 3.7 mmol/L (ref 3.5–5.1)
Sodium: 141 mmol/L (ref 135–145)
Total Protein: 6.8 g/dL (ref 6.5–8.1)

## 2018-03-06 LAB — CBC WITH DIFFERENTIAL/PLATELET
BASOS ABS: 0 10*3/uL (ref 0–0.1)
Basophils Relative: 1 %
Eosinophils Absolute: 0 10*3/uL (ref 0–0.7)
Eosinophils Relative: 1 %
HEMATOCRIT: 30.1 % — AB (ref 40.0–52.0)
HEMOGLOBIN: 9.8 g/dL — AB (ref 13.0–18.0)
LYMPHS PCT: 15 %
Lymphs Abs: 1 10*3/uL (ref 1.0–3.6)
MCH: 27.1 pg (ref 26.0–34.0)
MCHC: 32.4 g/dL (ref 32.0–36.0)
MCV: 83.7 fL (ref 80.0–100.0)
Monocytes Absolute: 0.5 10*3/uL (ref 0.2–1.0)
Monocytes Relative: 8 %
NEUTROS ABS: 4.8 10*3/uL (ref 1.4–6.5)
NEUTROS PCT: 75 %
PLATELETS: 263 10*3/uL (ref 150–440)
RBC: 3.6 MIL/uL — AB (ref 4.40–5.90)
RDW: 16.6 % — ABNORMAL HIGH (ref 11.5–14.5)
WBC: 6.3 10*3/uL (ref 3.8–10.6)

## 2018-03-06 LAB — PROTIME-INR
INR: 1.24
Prothrombin Time: 15.5 seconds — ABNORMAL HIGH (ref 11.4–15.2)

## 2018-03-06 LAB — TROPONIN I

## 2018-03-06 LAB — BRAIN NATRIURETIC PEPTIDE: B Natriuretic Peptide: 355 pg/mL — ABNORMAL HIGH (ref 0.0–100.0)

## 2018-03-06 MED ORDER — FUROSEMIDE 40 MG PO TABS
20.0000 mg | ORAL_TABLET | Freq: Once | ORAL | Status: AC
  Administered 2018-03-06: 20 mg via ORAL
  Filled 2018-03-06: qty 1

## 2018-03-06 NOTE — ED Notes (Signed)
Patient transported to X-ray 

## 2018-03-06 NOTE — ED Triage Notes (Signed)
Pt in via [facility] from [facility]; pt complains of increasing shortness of breath x one day.  Pt hypertensive on arrival, other vitals WDL, NAD noted at this time.

## 2018-03-06 NOTE — Discharge Instructions (Signed)
Return to the emergency room for any new or worrisome symptoms. °

## 2018-03-06 NOTE — ED Provider Notes (Addendum)
[facility] Emergency Department Provider Note  ____________________________________________   I have reviewed the triage vital signs and the nursing notes. Where available I have reviewed prior notes and, if possible and indicated, outside hospital notes.    HISTORY  Chief Complaint Shortness of Breath    HPI Danny Grimes is a 84 y.o. male patient here from EMS transport, 84 year old gentleman history of pacemaker, DNR order, depression, DJD, CHF, renal masses, CVA A. fib Parkinson's disease etc. presents today complaining of having felt short of breath for a brief period earlier today.  He states he got up to go to the bathroom and felt short of breath in the night.  That was 4 AM.  He states since that time he is felt "a little" short of breath he denies any chest pain, he states he feels "pretty good".  Denies any orthopnea or leg swelling.  Symptoms are otherwise somewhat poorly described.   Past Medical History:  Diagnosis Date  . Adenocarcinoma ([place])   . Anxiety   . Bilateral renal masses   . CHF (congestive heart failure) ([place])   . Colon polyp   . Degenerative joint disease   . Depression   . DNR (do not resuscitate)   . Fitting and adjustment of cardiac pacemaker   . Hemiparesis affecting left side as late effect of cerebrovascular accident (CVA) ([place])   . Hyperlipemia   . Hypertension   . Parkinson disease ([place])   . Paroxysmal atrial fibrillation (HCC)   . Peripheral arterial disease ([place])   . Skin cancer     Patient Active Problem List   Diagnosis Date Noted  . Generalized weakness 02/21/2018  . 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 CHF (congestive heart failure) ([place]) 06/24/2016  . A-fib ([place]) 06/24/2016  . HTN (hypertension) 06/24/2016  . Hyperlipidemia 06/24/2016    Past Surgical History:  Procedure Laterality Date  . insert / replace/ remove peacemaker      Prior to Admission medications   Medication Sig  Start Date End Date Taking? Authorizing Provider  acetaminophen (TYLENOL) 325 MG tablet Take 650 mg by mouth. Take 650mg  by mouth 3 times daily (scheduled) and 650mg  by mouth every 6 hours (as needed)    [provider]  apixaban (ELIQUIS) 5 MG TABS tablet Take 5 mg by mouth 2 (two) times daily.     [provider]  aspirin EC 81 MG tablet Take 81 mg by mouth daily.    [provider]  atorvastatin (LIPITOR) 40 MG tablet Take 40 mg by mouth daily.    [provider]  Calcium Carbonate-Vitamin D 600-400 MG-UNIT tablet Take 2 tablets by mouth daily with breakfast.    [provider]  carbidopa-levodopa (SINEMET IR) 25-100 MG tablet Take 2 tablets by mouth 3 (three) times daily.     [provider]  Cholecalciferol 10000 units TABS Take 1,000 Units by mouth daily.     [provider]  DULoxetine (CYMBALTA) 30 MG capsule Take 90 mg by mouth daily.    [provider]  finasteride (PROSCAR) 5 MG tablet Take 5 mg by mouth every other day.     [provider]  folic acid (FOLVITE) 1 MG tablet Take 1 mg by mouth daily.    [provider]  furosemide (LASIX) 20 MG tablet Take 1 tablet (20 mg total) by mouth daily. 02/22/18 02/22/19  Salary, Holly Bodily D, MD  Melatonin 3 MG TABS Take 6 mg by mouth  at bedtime as needed (sleep).    [provider]  metoprolol succinate (TOPROL-XL) 25 MG 24 hr tablet Take 25 mg by mouth daily.    [provider]  OLANZapine (ZYPREXA) 2.5 MG tablet Take 2.5 mg by mouth at bedtime.    [provider]  pantoprazole (PROTONIX) 40 MG tablet Take 40 mg by mouth 2 (two) times daily.    [provider]  Petrolatum-Zinc Oxide (SENSI-CARE PROTECTIVE BARRIER EX) Apply 1 application topically 2 (two) times daily.    [provider]  polyethylene glycol (MIRALAX / GLYCOLAX) packet Take 17 g by mouth daily.    [provider]  senna-docusate (SENOKOT-S)  8.6-50 MG tablet Take 1 tablet by mouth 2 (two) times daily.    [provider]  tamsulosin (FLOMAX) 0.4 MG CAPS capsule Take 1 capsule (0.4 mg total) by mouth every morning. Patient not taking: Reported on 02/19/2017 05/29/16   Lorin Picket, PA-C  terazosin (HYTRIN) 5 MG capsule Take 5 mg by mouth at bedtime.    [provider]  vitamin B-12 (CYANOCOBALAMIN) 1000 MCG tablet Take 1,000 mcg by mouth daily.    [provider]    Allergies Bee venom  No family history on file.  Social History Social History   Tobacco Use  . Smoking status: Former Research scientist (life sciences)  . Smokeless tobacco: Never Used  Substance Use Topics  . Alcohol use: No  . Drug use: No    Review of Systems Constitutional: No fever/chills Eyes: No visual changes. ENT: No sore throat. No stiff neck no neck pain Cardiovascular: Denies chest pain. Respiratory: + shortness of breath. Gastrointestinal:   no vomiting.  No diarrhea.  No constipation. Genitourinary: Negative for dysuria. Musculoskeletal: Negative lower extremity swelling Skin: Negative for rash. Neurological: Negative for severe headaches, focal weakness or numbness.   ____________________________________________   PHYSICAL EXAM:  VITAL SIGNS: ED Triage Vitals  Enc Vitals Group     BP      Pulse      Resp      Temp      Temp src      SpO2      Weight      Height      Head Circumference      Peak Flow      Pain Score      Pain Loc      Pain Edu?      Excl. in [place]?     Constitutional: Alert and oriented. Well appearing and in no acute distress. Eyes: Conjunctivae are normal Head: Atraumatic HEENT: No congestion/rhinnorhea. Mucous membranes are moist.  Oropharynx non-erythematous Neck:   Nontender with no meningismus, no masses, no stridor Cardiovascular: Normal rate, regular rhythm. Grossly normal heart sounds.  Good peripheral circulation. Respiratory: Normal respiratory effort.  No retractions. Lungs perhaps  diminished bibasilarly, not a great inspiratory effort Abdominal: Soft and nontender. No distention. No guarding no rebound Back:  There is no focal tenderness or step off.  there is no midline tenderness there are no lesions noted. there is no CVA tenderness  Musculoskeletal: No lower extremity tenderness, no upper extremity tenderness. No joint effusions, no DVT signs strong distal pulses no edema Neurologic:  Normal speech and language. No gross focal neurologic deficits are appreciated.  Skin:  Skin is warm, dry and intact. No rash noted. Psychiatric: Mood and affect are normal. Speech and behavior are normal.  ____________________________________________   LABS (all labs ordered are listed, but only abnormal  results are displayed)  Labs Reviewed  TROPONIN I  CBC WITH DIFFERENTIAL/PLATELET  BRAIN NATRIURETIC PEPTIDE  COMPREHENSIVE METABOLIC PANEL  PROTIME-INR    Pertinent labs  results that were available during my care of the patient were reviewed by me and considered in my medical decision making (see chart for details). ____________________________________________  EKG  I personally interpreted any EKGs ordered by me or triage Pacemaker rhythm, rate 94 occasional organic complex noted ____________________________________________  RADIOLOGY  Pertinent labs & imaging results that were available during my care of the patient were reviewed by me and considered in my medical decision making (see chart for details). If possible, patient and/or family made aware of any abnormal findings.  No results found. ____________________________________________    PROCEDURES  Procedure(s) performed: None  Procedures  Critical Care performed: None  ____________________________________________   INITIAL IMPRESSION / ASSESSMENT AND PLAN / ED COURSE  Pertinent labs & imaging results that were available during my care of the patient were reviewed by me and considered in my medical  decision making (see chart for details). Patient here with reports of shortness of breath, per EMS his sats were 98 on room air, lungs are slightly diminished in the bases but it could be because of compliance with deep breath.  We will obtain chest x-ray evaluate for CHF pneumonia pneumothorax and other causes of shortness of breath, the rest of his workup looks as good as he does at the moment I think he likely can go back home  ----------------------------------------- 3:25 PM on 03/06/2018 -----------------------------------------  Patient in no acute distress lungs are clear sats are 100%, I talked to his son, he states that anxiety is the patient's biggest problem and he has "hypochondriasis", according to the son, the patient will often think of different things to complain about because he is worried about his health.  Patient's did ask me to look at an area on his right hip which is a well-healed scab from prior tape application or something which looks completely healthy at this time and then he asked me to look at his right ear, and seem to have multiple complaints that evolved as he was reassured about each 1 of them.  In any event his son feels very comfortable having him go home and we will discharge him with close outpatient follow-up and return precautions.  I will give him a small dose of p.o. Lasix as his BNP is elevated and we do not have a baseline.  He does not require however admission to the hospital.    ____________________________________________   FINAL CLINICAL IMPRESSION(S) / ED DIAGNOSES  Final diagnoses:  None      This chart was dictated using voice recognition software.  Despite best efforts to proofread,  errors can occur which can change meaning.      Schuyler Amor, MD 03/06/18 1348    Schuyler Amor, MD 03/06/18 1349    Schuyler Amor, MD 03/06/18 [id number]

## 2018-03-07 DIAGNOSIS — I130 Hypertensive heart and chronic kidney disease with heart failure and stage 1 through stage 4 chronic kidney disease, or unspecified chronic kidney disease: Secondary | ICD-10-CM | POA: Diagnosis not present

## 2018-03-07 DIAGNOSIS — M1991 Primary osteoarthritis, unspecified site: Secondary | ICD-10-CM | POA: Diagnosis not present

## 2018-03-07 DIAGNOSIS — I509 Heart failure, unspecified: Secondary | ICD-10-CM | POA: Diagnosis not present

## 2018-03-07 DIAGNOSIS — I480 Paroxysmal atrial fibrillation: Secondary | ICD-10-CM | POA: Diagnosis not present

## 2018-03-07 DIAGNOSIS — G20 Parkinson's disease: Secondary | ICD-10-CM | POA: Diagnosis not present

## 2018-03-07 DIAGNOSIS — N182 Chronic kidney disease, stage 2 (mild): Secondary | ICD-10-CM | POA: Diagnosis not present

## 2018-03-07 NOTE — Care Management (Signed)
Post discharge note entry 03/07/18- This RNCM notified that patient is from facility followed by [facility]. Per [facility], they have been trying to get orders from [place] PCP to treat skin breakdown area.  I have reached out to [facility] to see if I can assist.  PCP is DR. Cornelia Copa; case worker is Kyla Balzarine [phone]. I spoke with Tia at [facility] and she will reach out to [facility] for follow up at W[facility] care clinic and also [facility] with [facility].  Patient was last seen by Dr. Wandra Arthurs 01/24/18 and right hip wound "was healing".

## 2018-06-03 ENCOUNTER — Encounter: Payer: Self-pay | Admitting: Emergency Medicine

## 2018-06-03 ENCOUNTER — Emergency Department: Payer: Medicare Other

## 2018-06-03 ENCOUNTER — Inpatient Hospital Stay
Admission: EM | Admit: 2018-06-03 | Discharge: 2018-06-10 | DRG: 057 | Disposition: A | Discharge status: Skilled Nursing Facility | Payer: Medicare Other | Attending: Internal Medicine | Admitting: Internal Medicine

## 2018-06-03 ENCOUNTER — Other Ambulatory Visit: Payer: Self-pay

## 2018-06-03 DIAGNOSIS — M6281 Muscle weakness (generalized): Secondary | ICD-10-CM | POA: Diagnosis not present

## 2018-06-03 DIAGNOSIS — I6501 Occlusion and stenosis of right vertebral artery: Secondary | ICD-10-CM | POA: Diagnosis present

## 2018-06-03 DIAGNOSIS — N400 Enlarged prostate without lower urinary tract symptoms: Secondary | ICD-10-CM | POA: Diagnosis present

## 2018-06-03 DIAGNOSIS — R4182 Altered mental status, unspecified: Secondary | ICD-10-CM | POA: Diagnosis not present

## 2018-06-03 DIAGNOSIS — I639 Cerebral infarction, unspecified: Secondary | ICD-10-CM | POA: Diagnosis present

## 2018-06-03 DIAGNOSIS — G8191 Hemiplegia, unspecified affecting right dominant side: Secondary | ICD-10-CM | POA: Diagnosis not present

## 2018-06-03 DIAGNOSIS — Z6826 Body mass index (BMI) 26.0-26.9, adult: Secondary | ICD-10-CM

## 2018-06-03 DIAGNOSIS — Z7982 Long term (current) use of aspirin: Secondary | ICD-10-CM

## 2018-06-03 DIAGNOSIS — I69354 Hemiplegia and hemiparesis following cerebral infarction affecting left non-dominant side: Secondary | ICD-10-CM

## 2018-06-03 DIAGNOSIS — R29898 Other symptoms and signs involving the musculoskeletal system: Secondary | ICD-10-CM | POA: Diagnosis not present

## 2018-06-03 DIAGNOSIS — R531 Weakness: Secondary | ICD-10-CM

## 2018-06-03 DIAGNOSIS — Z823 Family history of stroke: Secondary | ICD-10-CM

## 2018-06-03 DIAGNOSIS — I5023 Acute on chronic systolic (congestive) heart failure: Secondary | ICD-10-CM | POA: Diagnosis not present

## 2018-06-03 DIAGNOSIS — Z950 Presence of cardiac pacemaker: Secondary | ICD-10-CM

## 2018-06-03 DIAGNOSIS — Z85828 Personal history of other malignant neoplasm of skin: Secondary | ICD-10-CM

## 2018-06-03 DIAGNOSIS — Z7951 Long term (current) use of inhaled steroids: Secondary | ICD-10-CM

## 2018-06-03 DIAGNOSIS — Z79899 Other long term (current) drug therapy: Secondary | ICD-10-CM

## 2018-06-03 DIAGNOSIS — I739 Peripheral vascular disease, unspecified: Secondary | ICD-10-CM | POA: Diagnosis present

## 2018-06-03 DIAGNOSIS — R29709 NIHSS score 9: Secondary | ICD-10-CM | POA: Diagnosis present

## 2018-06-03 DIAGNOSIS — E440 Moderate protein-calorie malnutrition: Secondary | ICD-10-CM | POA: Diagnosis not present

## 2018-06-03 DIAGNOSIS — Z7901 Long term (current) use of anticoagulants: Secondary | ICD-10-CM | POA: Diagnosis not present

## 2018-06-03 DIAGNOSIS — R1310 Dysphagia, unspecified: Secondary | ICD-10-CM | POA: Diagnosis present

## 2018-06-03 DIAGNOSIS — I248 Other forms of acute ischemic heart disease: Secondary | ICD-10-CM | POA: Diagnosis present

## 2018-06-03 DIAGNOSIS — E785 Hyperlipidemia, unspecified: Secondary | ICD-10-CM | POA: Diagnosis present

## 2018-06-03 DIAGNOSIS — G20 Parkinson's disease: Secondary | ICD-10-CM | POA: Diagnosis present

## 2018-06-03 DIAGNOSIS — Z7401 Bed confinement status: Secondary | ICD-10-CM

## 2018-06-03 DIAGNOSIS — I110 Hypertensive heart disease with heart failure: Secondary | ICD-10-CM | POA: Diagnosis present

## 2018-06-03 DIAGNOSIS — M549 Dorsalgia, unspecified: Secondary | ICD-10-CM | POA: Diagnosis present

## 2018-06-03 DIAGNOSIS — Z66 Do not resuscitate: Secondary | ICD-10-CM | POA: Diagnosis present

## 2018-06-03 DIAGNOSIS — I480 Paroxysmal atrial fibrillation: Secondary | ICD-10-CM | POA: Diagnosis present

## 2018-06-03 DIAGNOSIS — L8990 Pressure ulcer of unspecified site, unspecified stage: Secondary | ICD-10-CM

## 2018-06-03 DIAGNOSIS — K219 Gastro-esophageal reflux disease without esophagitis: Secondary | ICD-10-CM | POA: Diagnosis present

## 2018-06-03 DIAGNOSIS — R5381 Other malaise: Secondary | ICD-10-CM | POA: Diagnosis present

## 2018-06-03 DIAGNOSIS — M4802 Spinal stenosis, cervical region: Secondary | ICD-10-CM

## 2018-06-03 DIAGNOSIS — M542 Cervicalgia: Secondary | ICD-10-CM | POA: Diagnosis not present

## 2018-06-03 DIAGNOSIS — F329 Major depressive disorder, single episode, unspecified: Secondary | ICD-10-CM | POA: Diagnosis present

## 2018-06-03 DIAGNOSIS — K5900 Constipation, unspecified: Secondary | ICD-10-CM | POA: Diagnosis present

## 2018-06-03 DIAGNOSIS — G8929 Other chronic pain: Secondary | ICD-10-CM | POA: Diagnosis present

## 2018-06-03 DIAGNOSIS — H9190 Unspecified hearing loss, unspecified ear: Secondary | ICD-10-CM | POA: Diagnosis present

## 2018-06-03 DIAGNOSIS — I4891 Unspecified atrial fibrillation: Secondary | ICD-10-CM | POA: Diagnosis not present

## 2018-06-03 DIAGNOSIS — Z8249 Family history of ischemic heart disease and other diseases of the circulatory system: Secondary | ICD-10-CM

## 2018-06-03 LAB — COMPREHENSIVE METABOLIC PANEL
ALT: <5 U/L — ABNORMAL LOW (ref 17–63)
AST: 20 U/L (ref 15–41)
Albumin: 2.7 g/dL — ABNORMAL LOW (ref 3.5–5.0)
Alkaline Phosphatase: 62 U/L (ref 38–126)
Anion gap: 11 (ref 5–15)
BILIRUBIN TOTAL: 1.1 mg/dL (ref 0.3–1.2)
BUN: 31 mg/dL — AB (ref 6–20)
CALCIUM: 9.3 mg/dL (ref 8.9–10.3)
CO2: 28 mmol/L (ref 22–32)
Chloride: 101 mmol/L (ref 101–111)
Creatinine, Ser: 1.04 mg/dL (ref 0.61–1.24)
GFR calc Af Amer: >60 mL/min (ref >60)
Glucose, Bld: 119 mg/dL — ABNORMAL HIGH (ref 65–99)
POTASSIUM: 3.7 mmol/L (ref 3.5–5.1)
Sodium: 140 mmol/L (ref 135–145)
Total Protein: 6.4 g/dL — ABNORMAL LOW (ref 6.5–8.1)

## 2018-06-03 LAB — URINALYSIS, COMPLETE (UACMP) WITH MICROSCOPIC
Bacteria, UA: NONE SEEN
Bilirubin Urine: NEGATIVE
Glucose, UA: NEGATIVE mg/dL
Hgb urine dipstick: NEGATIVE
Ketones, ur: 5 mg/dL — AB
Leukocytes, UA: NEGATIVE
Nitrite: NEGATIVE
Protein, ur: NEGATIVE mg/dL
Specific Gravity, Urine: 1.02 (ref 1.005–1.030)
Squamous Epithelial / HPF: NONE SEEN (ref 0–5)
pH: 5 (ref 5.0–8.0)

## 2018-06-03 LAB — CBC
HEMATOCRIT: 28.6 % — AB (ref 40.0–52.0)
HEMOGLOBIN: 9 g/dL — AB (ref 13.0–18.0)
MCH: 23.3 pg — ABNORMAL LOW (ref 26.0–34.0)
MCHC: 31.5 g/dL — AB (ref 32.0–36.0)
MCV: 73.9 fL — ABNORMAL LOW (ref 80.0–100.0)
Platelets: 275 10*3/uL (ref 150–440)
RBC: 3.87 MIL/uL — ABNORMAL LOW (ref 4.40–5.90)
RDW: 17.9 % — ABNORMAL HIGH (ref 11.5–14.5)
WBC: 7.6 10*3/uL (ref 3.8–10.6)

## 2018-06-03 LAB — PROTIME-INR
INR: 1.9
Prothrombin Time: 21.6 seconds — ABNORMAL HIGH (ref 11.4–15.2)

## 2018-06-03 LAB — DIFFERENTIAL
BASOS ABS: 0.1 10*3/uL (ref 0–0.1)
Basophils Relative: 1 %
EOS ABS: 0 10*3/uL (ref 0–0.7)
Eosinophils Relative: 1 %
LYMPHS ABS: 0.7 10*3/uL — AB (ref 1.0–3.6)
Lymphocytes Relative: 10 %
MONOS PCT: 8 %
Monocytes Absolute: 0.6 10*3/uL (ref 0.2–1.0)
Neutro Abs: 6.1 10*3/uL (ref 1.4–6.5)
Neutrophils Relative %: 80 %

## 2018-06-03 LAB — TROPONIN I
TROPONIN I: 0.07 ng/mL — AB (ref <0.03)
Troponin I: 0.07 ng/mL (ref <0.03)

## 2018-06-03 LAB — TSH: TSH: 0.37 u[IU]/mL (ref 0.350–4.500)

## 2018-06-03 LAB — APTT: APTT: 47 s — AB (ref 24–36)

## 2018-06-03 MED ORDER — IOPAMIDOL (ISOVUE-370) INJECTION 76%
100.0000 mL | Freq: Once | INTRAVENOUS | Status: AC | PRN
  Administered 2018-06-03: 100 mL via INTRAVENOUS

## 2018-06-03 MED ORDER — OMEGA-3-ACID ETHYL ESTERS 1 G PO CAPS
1.0000 g | ORAL_CAPSULE | Freq: Two times a day (BID) | ORAL | Status: DC
  Administered 2018-06-04 – 2018-06-10 (×11): 1 g via ORAL
  Filled 2018-06-03 (×13): qty 1

## 2018-06-03 MED ORDER — CALCIUM CARBONATE-VITAMIN D 500-200 MG-UNIT PO TABS
2.0000 | ORAL_TABLET | Freq: Every day | ORAL | Status: DC
  Administered 2018-06-05: 10:00:00 2 via ORAL
  Filled 2018-06-03 (×2): qty 2

## 2018-06-03 MED ORDER — MELATONIN 5 MG PO TABS
5.0000 mg | ORAL_TABLET | Freq: Every day | ORAL | Status: DC
  Administered 2018-06-04 – 2018-06-05 (×2): 5 mg via ORAL
  Filled 2018-06-03 (×4): qty 1

## 2018-06-03 MED ORDER — MELATONIN 3 MG PO TABS: 6.0000 mg | ORAL_TABLET | Freq: Every day | ORAL | Status: DC

## 2018-06-03 MED ORDER — MORPHINE SULFATE (PF) 4 MG/ML IV SOLN
INTRAVENOUS | Status: AC
  Administered 2018-06-03: 4 mg via INTRAVENOUS
  Filled 2018-06-03: qty 1

## 2018-06-03 MED ORDER — STROKE: EARLY STAGES OF RECOVERY BOOK
Freq: Once | Status: AC
  Administered 2018-06-03: 22:00:00

## 2018-06-03 MED ORDER — VITAMIN D 1000 UNITS PO TABS
1000.0000 [IU] | ORAL_TABLET | Freq: Every day | ORAL | Status: DC
  Administered 2018-06-04 – 2018-06-05 (×2): 1000 [IU] via ORAL
  Filled 2018-06-03 (×2): qty 1

## 2018-06-03 MED ORDER — PANTOPRAZOLE SODIUM 40 MG PO TBEC
40.0000 mg | DELAYED_RELEASE_TABLET | Freq: Two times a day (BID) | ORAL | Status: DC
  Administered 2018-06-04 – 2018-06-10 (×13): 40 mg via ORAL
  Filled 2018-06-03 (×13): qty 1

## 2018-06-03 MED ORDER — TROLAMINE SALICYLATE 10 % EX CREA
TOPICAL_CREAM | Freq: Three times a day (TID) | CUTANEOUS | Status: DC | PRN
  Filled 2018-06-03: qty 85

## 2018-06-03 MED ORDER — FLUTICASONE PROPIONATE 50 MCG/ACT NA SUSP
1.0000 | Freq: Every day | NASAL | Status: DC
  Administered 2018-06-04 – 2018-06-05 (×2): 1 via NASAL
  Filled 2018-06-03: qty 16

## 2018-06-03 MED ORDER — METOPROLOL SUCCINATE ER 25 MG PO TB24
25.0000 mg | ORAL_TABLET | Freq: Every day | ORAL | Status: DC
  Administered 2018-06-04 – 2018-06-10 (×7): 25 mg via ORAL
  Filled 2018-06-03 (×8): qty 1

## 2018-06-03 MED ORDER — FOLIC ACID 1 MG PO TABS
1.0000 mg | ORAL_TABLET | Freq: Every day | ORAL | Status: DC
  Administered 2018-06-04 – 2018-06-05 (×2): 1 mg via ORAL
  Filled 2018-06-03 (×2): qty 1

## 2018-06-03 MED ORDER — ASPIRIN 81 MG PO CHEW
81.0000 mg | CHEWABLE_TABLET | Freq: Every day | ORAL | Status: DC
Start: 2018-06-03 — End: 2018-06-04
  Administered 2018-06-04: 81 mg via ORAL
  Filled 2018-06-03: qty 1

## 2018-06-03 MED ORDER — TERAZOSIN HCL 5 MG PO CAPS
5.0000 mg | ORAL_CAPSULE | Freq: Every day | ORAL | Status: DC
  Administered 2018-06-04 – 2018-06-09 (×6): 5 mg via ORAL
  Filled 2018-06-03 (×8): qty 1

## 2018-06-03 MED ORDER — PSYLLIUM 95 % PO PACK
1.0000 | PACK | Freq: Every day | ORAL | Status: DC
  Administered 2018-06-04 – 2018-06-05 (×2): 1 via ORAL
  Filled 2018-06-03 (×4): qty 1

## 2018-06-03 MED ORDER — APIXABAN 5 MG PO TABS
5.0000 mg | ORAL_TABLET | Freq: Two times a day (BID) | ORAL | Status: DC
  Administered 2018-06-04 – 2018-06-05 (×3): 5 mg via ORAL
  Filled 2018-06-03 (×3): qty 1

## 2018-06-03 MED ORDER — ONDANSETRON HCL 4 MG/2ML IJ SOLN: 4.0000 mg | Freq: Four times a day (QID) | INTRAMUSCULAR | Status: DC | PRN

## 2018-06-03 MED ORDER — ONDANSETRON HCL 4 MG PO TABS: 4.0000 mg | ORAL_TABLET | Freq: Four times a day (QID) | ORAL | Status: DC | PRN

## 2018-06-03 MED ORDER — MORPHINE SULFATE (PF) 4 MG/ML IV SOLN
4.0000 mg | Freq: Once | INTRAVENOUS | Status: AC
Start: 2018-06-03 — End: 2018-06-03
  Administered 2018-06-03: 4 mg via INTRAVENOUS

## 2018-06-03 MED ORDER — MICONAZOLE NITRATE 2 % EX CREA
1.0000 "application " | TOPICAL_CREAM | Freq: Two times a day (BID) | CUTANEOUS | Status: DC | PRN
  Filled 2018-06-03: qty 14

## 2018-06-03 MED ORDER — ACETAMINOPHEN 650 MG RE SUPP: 650.0000 mg | Freq: Four times a day (QID) | RECTAL | Status: DC | PRN

## 2018-06-03 MED ORDER — ORAL CARE MOUTH RINSE
15.0000 mL | Freq: Two times a day (BID) | OROMUCOSAL | Status: DC
  Administered 2018-06-04 – 2018-06-10 (×12): 15 mL via OROMUCOSAL

## 2018-06-03 MED ORDER — LORATADINE 10 MG PO TABS: 10.0000 mg | ORAL_TABLET | Freq: Every day | ORAL | Status: DC | PRN

## 2018-06-03 MED ORDER — OLANZAPINE 2.5 MG PO TABS
2.5000 mg | ORAL_TABLET | Freq: Every day | ORAL | Status: DC
  Administered 2018-06-04 – 2018-06-09 (×6): 2.5 mg via ORAL
  Filled 2018-06-03 (×8): qty 1

## 2018-06-03 MED ORDER — ATORVASTATIN CALCIUM 20 MG PO TABS
40.0000 mg | ORAL_TABLET | Freq: Every day | ORAL | Status: DC
  Administered 2018-06-04 – 2018-06-09 (×6): 40 mg via ORAL
  Filled 2018-06-03 (×6): qty 2

## 2018-06-03 MED ORDER — TORSEMIDE 20 MG PO TABS
10.0000 mg | ORAL_TABLET | Freq: Every day | ORAL | Status: DC
  Administered 2018-06-04 – 2018-06-05 (×2): 10 mg via ORAL
  Filled 2018-06-03: qty 0.5
  Filled 2018-06-03: qty 1

## 2018-06-03 MED ORDER — DOCUSATE SODIUM 100 MG PO CAPS
100.0000 mg | ORAL_CAPSULE | Freq: Two times a day (BID) | ORAL | Status: DC
  Administered 2018-06-04 – 2018-06-09 (×12): 100 mg via ORAL
  Filled 2018-06-03 (×13): qty 1

## 2018-06-03 MED ORDER — DULOXETINE HCL 30 MG PO CPEP
90.0000 mg | ORAL_CAPSULE | Freq: Every day | ORAL | Status: DC
  Administered 2018-06-04 – 2018-06-10 (×7): 90 mg via ORAL
  Filled 2018-06-03 (×7): qty 3

## 2018-06-03 MED ORDER — THERA-GESIC 0.5-15 % EX CREA: 1.0000 | TOPICAL_CREAM | Freq: Three times a day (TID) | CUTANEOUS | Status: DC | PRN

## 2018-06-03 MED ORDER — ACETAMINOPHEN 325 MG PO TABS
650.0000 mg | ORAL_TABLET | Freq: Four times a day (QID) | ORAL | Status: DC | PRN
  Administered 2018-06-04 – 2018-06-09 (×4): 650 mg via ORAL
  Filled 2018-06-03 (×4): qty 2

## 2018-06-03 MED ORDER — SODIUM CHLORIDE 0.9 % IV BOLUS
500.0000 mL | Freq: Once | INTRAVENOUS | Status: AC
  Administered 2018-06-03: 500 mL via INTRAVENOUS

## 2018-06-03 MED ORDER — VITAMIN B-12 1000 MCG PO TABS
1000.0000 ug | ORAL_TABLET | Freq: Every day | ORAL | Status: DC
  Administered 2018-06-04 – 2018-06-05 (×2): 1000 ug via ORAL
  Filled 2018-06-03 (×2): qty 1

## 2018-06-03 MED ORDER — CARBIDOPA-LEVODOPA 25-100 MG PO TABS
2.0000 | ORAL_TABLET | Freq: Three times a day (TID) | ORAL | Status: DC
  Administered 2018-06-04: 2 via ORAL
  Filled 2018-06-03 (×4): qty 2

## 2018-06-03 MED ORDER — FINASTERIDE 5 MG PO TABS
5.0000 mg | ORAL_TABLET | Freq: Every day | ORAL | Status: DC
  Administered 2018-06-04 – 2018-06-10 (×7): 5 mg via ORAL
  Filled 2018-06-03 (×7): qty 1

## 2018-06-03 MED ORDER — POLYVINYL ALCOHOL 1.4 % OP SOLN
1.0000 [drp] | Freq: Three times a day (TID) | OPHTHALMIC | Status: DC | PRN
  Filled 2018-06-03: qty 15

## 2018-06-03 MED ORDER — SENNOSIDES-DOCUSATE SODIUM 8.6-50 MG PO TABS
1.0000 | ORAL_TABLET | Freq: Two times a day (BID) | ORAL | Status: DC
  Administered 2018-06-04 – 2018-06-07 (×8): 1 via ORAL
  Filled 2018-06-03 (×8): qty 1

## 2018-06-03 MED ORDER — CARBOXYMETHYLCELLULOSE SODIUM 0.5 % OP SOLN: 1.0000 [drp] | Freq: Three times a day (TID) | OPHTHALMIC | Status: DC | PRN

## 2018-06-03 NOTE — ED Provider Notes (Signed)
[facility] Emergency Department Provider Note ____________________________________________   First MD Initiated Contact with Patient 06/03/18 1500     (approximate)  I have reviewed the triage vital signs and the nursing notes.   HISTORY  Chief Complaint Altered Mental Status    HPI Danny Grimes is a 84 y.o. male with PMH as noted below including Parkinson's disease A. fib, CHF, and CVA who presents with generalized weakness over the last several days, unknown exact onset, bilateral, and associated with neck pain and body aches.  The patient lives in assisted living and normally is able to transfer himself from bed to wheelchair.  He was last seen at his baseline 2 days ago.  He denies fall or trauma.  He states that he cannot move his arms or legs.  Past Medical History:  Diagnosis Date  . Adenocarcinoma ([place])   . Anxiety   . Bilateral renal masses   . CHF (congestive heart failure) ([place])   . Colon polyp   . Degenerative joint disease   . Depression   . DNR (do not resuscitate)   . Fitting and adjustment of cardiac pacemaker   . Hemiparesis affecting left side as late effect of cerebrovascular accident (CVA) ([place])   . Hyperlipemia   . Hypertension   . Parkinson disease ([place])   . Paroxysmal atrial fibrillation (HCC)   . Peripheral arterial disease ([place])   . Skin cancer     Patient Active Problem List   Diagnosis Date Noted  . Generalized weakness 02/21/2018  . 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 CHF (congestive heart failure) ([place]) 06/24/2016  . A-fib ([place]) 06/24/2016  . HTN (hypertension) 06/24/2016  . Hyperlipidemia 06/24/2016    Past Surgical History:  Procedure Laterality Date  . insert / replace/ remove peacemaker      Prior to Admission medications   Medication Sig Start Date End Date Taking? Authorizing Provider  acetaminophen (TYLENOL) 325 MG tablet Take 650 mg by mouth 3 (three) times daily.    Yes [provider]  apixaban (ELIQUIS) 5 MG TABS tablet Take 5 mg by mouth every 12 (twelve) hours.    Yes [provider]  aspirin 81 MG chewable tablet Chew 81 mg by mouth daily.   Yes [provider]  atorvastatin (LIPITOR) 80 MG tablet Take 40 mg by mouth at bedtime.   Yes [provider]  Calcium Carbonate-Vitamin D 600-400 MG-UNIT tablet Take 2 tablets by mouth daily with breakfast.   Yes [provider]  carbidopa-levodopa (SINEMET IR) 25-100 MG tablet Take 2 tablets by mouth 3 (three) times daily.    Yes [provider]  carboxymethylcellulose (REFRESH TEARS) 0.5 % SOLN Place 1 drop into both eyes 3 (three) times daily as needed (for dry eyes).   Yes [provider]  Cholecalciferol 10000 units TABS Take 1,000 Units by mouth daily.    Yes [provider]  DULoxetine (CYMBALTA) 30 MG capsule Take 90 mg by mouth daily.   Yes [provider]  finasteride (PROSCAR) 5 MG tablet Take 5 mg by mouth daily.    Yes [provider]  fluticasone (FLONASE) 50 MCG/ACT nasal spray Place 1 spray into both nostrils daily.   Yes [provider]  folic acid (FOLVITE) 1 MG tablet Take 1 mg by mouth daily.   Yes [provider]  ibuprofen (ADVIL,MOTRIN) 400 MG tablet Take 400 mg by mouth as needed for mild pain or moderate pain.  Yes [provider]  loratadine (CLARITIN) 10 MG tablet Take 10 mg by mouth daily as needed for allergies.   Yes [provider]  Melatonin 3 MG TABS Take 6 mg by mouth at bedtime.    Yes [provider]  Menthol-Methyl Salicylate (THERA-GESIC EX) Apply topically 3 (three) times daily as needed (for pain on buttocks, hips, and ankle).   Yes [provider]  metoprolol succinate (TOPROL-XL) 50 MG 24 hr tablet Take 25 mg by mouth daily. Take with or immediately following a meal.   Yes [provider]  miconazole (MICOTIN) 2 % cream Apply 1  application topically 2 (two) times daily as needed (for fungal infection).   Yes [provider]  OLANZapine (ZYPREXA) 2.5 MG tablet Take 2.5 mg by mouth at bedtime.   Yes [provider]  omega-3 acid ethyl esters (LOVAZA) 1 g capsule Take 1 g by mouth 2 (two) times daily.   Yes [provider]  pantoprazole (PROTONIX) 40 MG tablet Take 40 mg by mouth 2 (two) times daily.   Yes [provider]  Petrolatum-Zinc Oxide (SENSI-CARE PROTECTIVE BARRIER EX) Apply 1 application topically 2 (two) times daily.   Yes [provider]  psyllium (HYDROCIL/METAMUCIL) 95 % PACK Take 1 packet by mouth daily.   Yes [provider]  Salicylic Acid 6 % SHAM Apply topically every other day.   Yes [provider]  senna-docusate (SENOKOT-S) 8.6-50 MG tablet Take 1 tablet by mouth 2 (two) times daily.   Yes [provider]  terazosin (HYTRIN) 5 MG capsule Take 5 mg by mouth at bedtime.   Yes [provider]  torsemide (DEMADEX) 10 MG tablet Take 10 mg by mouth daily.   Yes [provider]  vitamin B-12 (CYANOCOBALAMIN) 1000 MCG tablet Take 1,000 mcg by mouth daily.   Yes [provider]  furosemide (LASIX) 20 MG tablet Take 1 tablet (20 mg total) by mouth daily. Patient not taking: Reported on 03/06/2018 02/22/18 02/22/19  Salary, Avel Peace, MD    Allergies Patient has no known allergies.  History reviewed. No pertinent family history.  Social History Social History   Tobacco Use  . Smoking status: Former Research scientist (life sciences)  . Smokeless tobacco: Never Used  Substance Use Topics  . Alcohol use: No  . Drug use: No    Review of Systems  Constitutional: No fever. Eyes: No redness. ENT: Positive for neck pain. Cardiovascular: Denies chest pain. Respiratory: Denies shortness of breath. Gastrointestinal: No vomiting.  Genitourinary: Negative for flank pain.  Musculoskeletal: Positive for back pain. Skin: Negative for  rash. Neurological: Negative for headache.   ____________________________________________   PHYSICAL EXAM:  VITAL SIGNS: ED Triage Vitals  Enc Vitals Group     BP 06/03/18 1437 126/75     Pulse Rate 06/03/18 1437 80     Resp 06/03/18 1437 (!) 23     Temp 06/03/18 1437 98.2 F (36.8 C)     Temp Source 06/03/18 1437 Oral     SpO2 06/03/18 1437 96 %     Weight 06/03/18 1438 186 lb (84.4 kg)     Height --      Head Circumference --      Peak Flow --      Pain Score 06/03/18 1438 8     Pain Loc --      Pain Edu? --      Excl. in [place]? --     Constitutional: Alert and oriented x4.  Weak appearing. Eyes: Conjunctivae are normal.  EOMI.  PERRLA. Head: Atraumatic. Nose: No congestion/rhinnorhea. Mouth/Throat: Mucous membranes are moist.   Neck: Normal range of motion.  Cardiovascular: Normal rate, regular rhythm. Grossly normal heart sounds.  Good peripheral circulation. Respiratory: Normal respiratory effort.  No retractions. Lungs CTAB. Gastrointestinal: Soft and nontender. No distention.  Genitourinary: No flank tenderness. Musculoskeletal: No lower extremity edema.  Extremities warm and well perfused.  Neurologic:  Normal speech and language.  Weak in all extremities.  Unable to grip bilaterally but he is able to move his arms slightly.  Unable to lift legs but is able to plantarflex. Skin:  Skin is warm and dry. No rash noted. Psychiatric: Speech and behavior are normal.  ____________________________________________   LABS (all labs ordered are listed, but only abnormal results are displayed)  Labs Reviewed  PROTIME-INR - Abnormal; Notable for the following components:      Result Value   Prothrombin Time 21.6 (*)    All other components within normal limits  APTT - Abnormal; Notable for the following components:   aPTT 47 (*)    All other components within normal limits  CBC - Abnormal; Notable for the following components:   RBC 3.87 (*)    Hemoglobin 9.0 (*)     HCT 28.6 (*)    MCV 73.9 (*)    MCH 23.3 (*)    MCHC 31.5 (*)    RDW 17.9 (*)    All other components within normal limits  DIFFERENTIAL - Abnormal; Notable for the following components:   Lymphs Abs 0.7 (*)    All other components within normal limits  COMPREHENSIVE METABOLIC PANEL - Abnormal; Notable for the following components:   Glucose, Bld 119 (*)    BUN 31 (*)    Total Protein 6.4 (*)    Albumin 2.7 (*)    ALT <5 (*)    All other components within normal limits  TROPONIN I - Abnormal; Notable for the following components:   Troponin I 0.07 (*)    All other components within normal limits  URINALYSIS, COMPLETE (UACMP) WITH MICROSCOPIC - Abnormal; Notable for the following components:   Color, Urine YELLOW (*)    APPearance CLEAR (*)    Ketones, ur 5 (*)    All other components within normal limits  CBG MONITORING, ED   ____________________________________________  EKG  ED ECG REPORT I, Arta Silence, the attending physician, personally viewed and interpreted this ECG.  Date: 06/03/2018 EKG Time: 1435 Rate: 87 Rhythm: AV dual paced rhythm QRS Axis: normal Intervals: normal ST/T Wave abnormalities: normal Narrative Interpretation: no evidence of acute ischemia  ____________________________________________  RADIOLOGY  CT head: Chronic findings with no acute ICH or ischemic stroke CT angio head: No acute findings CT angio neck: Vertebral artery stenosis CXR: No acute findings  ____________________________________________   PROCEDURES  Procedure(s) performed: No  Procedures  Critical Care performed: No ____________________________________________   INITIAL IMPRESSION / ASSESSMENT AND PLAN / ED COURSE  Pertinent labs & imaging results that were available during my care of the patient were reviewed by me and considered in my medical decision making (see chart for details).  84 year old male with PMH as noted above presents with weakness to  bilateral upper and lower extremities and inability to move around sometime in the last 2 days.  Patient is oriented x4.  He denies any fall or trauma.  On exam, the vital signs are normal.  The patient is somewhat chronically ill and weak  appearing, and he has minimal strength to upper and lower extremities bilaterally although he is able to move them somewhat.  No abnormalities on neck exam and no midline spinal tenderness.  Differential includes acute stroke (especially given that the patient had chronic hemiparesis prior), subacute stroke or cerebral ischemia, Parkinson's related symptoms, or less likely cervical spinal etiology given that the patient had no trauma, and is breathing without difficulty.  Also consider UTI or other infection, electrolyte abnormality,anemia, or other global etiology.    Initial CT head shows chronic changes but no acute findings.  Plan: Lab work-up, infection/sepsis work-up, CT angiogram of the head and neck, and reassess.  I am unable to obtain an MRI on the patient here at [facility] due to his pacemaker.  ----------------------------------------- 8:05 PM on 06/03/2018 -----------------------------------------  CT angiogram of the head and neck were obtained and showed no significant acute findings, but patient does have right vertebral artery stenosis.  On further history from the patient, he confirms that he has old hemiparesis of the left side, so likely only the right-sided weakness is new.  He is able to move the right arm somewhat especially proximally.  Overall I have a low suspicion for cervical/spinal etiology.  The lab work-up shows multiple abnormalities but no significant acute findings to fully explain patient's symptoms.  Given his acute weakness, he will require admission for stroke work-up, PT, and additional care.  I signed the patient out to the hospitalist Dr. Marcille Blanco. ____________________________________________   FINAL CLINICAL IMPRESSION(S) / ED  DIAGNOSES  Final diagnoses:  Weakness of both arms      NEW MEDICATIONS STARTED DURING THIS VISIT:  New Prescriptions   No medications on file     Note:  This document was prepared using Dragon voice recognition software and may include unintentional dictation errors.    Arta Silence, MD 06/03/18 2008

## 2018-06-03 NOTE — ED Triage Notes (Signed)
Pt to ED via EMS from [facility] states per facility patient last known well was Sunday, patient found to not be able to move, incontinent, and c/o neck pain radiating down back.  Patient normally can transfer himself from bed to wheelchair.  Pt A&Ox4, unable to grip hands or hold arms or legs up bilaterally, sensation intact bilaterally.

## 2018-06-03 NOTE — H&P (Signed)
Danny Grimes is an 84 y.o. male.   Chief Complaint: Altered mental status HPI: The patient with past medical history of hemiparesis of his left side status post CVA, CHF, hypertension, Parkinson's disease, atrial fibrillation and history of cancer presents the emergency department from his nursing home due to concern from staff that he was less responsive than usual.  The patient was last known well 2 days ago.  He reports that he is now weak on his right side.  Sensation is normal in all 4 extremities but he cannot move his arms or grip with his hands in either upper extremity.  CTA of his head and neck revealed severe stenosis of the right vertebral artery origin but patent vascular flow throughout the head and neck and no obvious areas of acute ischemia.  While at bedside the patient was drinking from a straw and appeared to have a minor aspiration episode.  Pulse oximetry remained normal but the patient states that he is too weak to give a good cough.  Due to suspected stroke and new onset of right-sided weakness the emergency department staff, hospitalist service for further evaluation.  Past Medical History:  Diagnosis Date  . Adenocarcinoma ([place])   . Anxiety   . Bilateral renal masses   . CHF (congestive heart failure) ([place])   . Colon polyp   . Degenerative joint disease   . Depression   . DNR (do not resuscitate)   . Fitting and adjustment of cardiac pacemaker   . Hemiparesis affecting left side as late effect of cerebrovascular accident (CVA) ([place])   . Hyperlipemia   . Hypertension   . Parkinson disease ([place])   . Paroxysmal atrial fibrillation ([facility])   . Peripheral arterial disease ([place])   . Skin cancer     Past Surgical History:  Procedure Laterality Date  . insert / replace/ remove peacemaker      History reviewed. No pertinent family history. Family is unavailable  Social History:  reports that he has quit smoking. He has never used smokeless tobacco. He reports that he does  not drink alcohol or use drugs.  Allergies: No Known Allergies  Medications Prior to Admission  Medication Sig Dispense Refill  . acetaminophen (TYLENOL) 325 MG tablet Take 650 mg by mouth 3 (three) times daily.     Marland Kitchen apixaban (ELIQUIS) 5 MG TABS tablet Take 5 mg by mouth every 12 (twelve) hours.     Marland Kitchen aspirin 81 MG chewable tablet Chew 81 mg by mouth daily.    Marland Kitchen atorvastatin (LIPITOR) 80 MG tablet Take 40 mg by mouth at bedtime.    . Calcium Carbonate-Vitamin D 600-400 MG-UNIT tablet Take 2 tablets by mouth daily with breakfast.    . carbidopa-levodopa (SINEMET IR) 25-100 MG tablet Take 2 tablets by mouth 3 (three) times daily.     . carboxymethylcellulose (REFRESH TEARS) 0.5 % SOLN Place 1 drop into both eyes 3 (three) times daily as needed (for dry eyes).    . Cholecalciferol 10000 units TABS Take 1,000 Units by mouth daily.     . DULoxetine (CYMBALTA) 30 MG capsule Take 90 mg by mouth daily.    . finasteride (PROSCAR) 5 MG tablet Take 5 mg by mouth daily.     . fluticasone (FLONASE) 50 MCG/ACT nasal spray Place 1 spray into both nostrils daily.    . folic acid (FOLVITE) 1 MG tablet Take 1 mg by mouth daily.    Marland Kitchen ibuprofen (ADVIL,MOTRIN) 400 MG tablet Take 400 mg  by mouth as needed for mild pain or moderate pain.    Marland Kitchen loratadine (CLARITIN) 10 MG tablet Take 10 mg by mouth daily as needed for allergies.    . Melatonin 3 MG TABS Take 6 mg by mouth at bedtime.     . Menthol-Methyl Salicylate (THERA-GESIC EX) Apply topically 3 (three) times daily as needed (for pain on buttocks, hips, and ankle).    . metoprolol succinate (TOPROL-XL) 50 MG 24 hr tablet Take 25 mg by mouth daily. Take with or immediately following a meal.    . miconazole (MICOTIN) 2 % cream Apply 1 application topically 2 (two) times daily as needed (for fungal infection).    . OLANZapine (ZYPREXA) 2.5 MG tablet Take 2.5 mg by mouth at bedtime.    Marland Kitchen omega-3 acid ethyl esters (LOVAZA) 1 g capsule Take 1 g by mouth 2 (two) times  daily.    . pantoprazole (PROTONIX) 40 MG tablet Take 40 mg by mouth 2 (two) times daily.    Marland Kitchen Petrolatum-Zinc Oxide (SENSI-CARE PROTECTIVE BARRIER EX) Apply 1 application topically 2 (two) times daily.    . psyllium (HYDROCIL/METAMUCIL) 95 % PACK Take 1 packet by mouth daily.    . Salicylic Acid 6 % SHAM Apply topically every other day.    . senna-docusate (SENOKOT-S) 8.6-50 MG tablet Take 1 tablet by mouth 2 (two) times daily.    Marland Kitchen terazosin (HYTRIN) 5 MG capsule Take 5 mg by mouth at bedtime.    . torsemide (DEMADEX) 10 MG tablet Take 10 mg by mouth daily.    . vitamin B-12 (CYANOCOBALAMIN) 1000 MCG tablet Take 1,000 mcg by mouth daily.    . furosemide (LASIX) 20 MG tablet Take 1 tablet (20 mg total) by mouth daily. (Patient not taking: Reported on 03/06/2018) 30 tablet 0    Results for orders placed or performed during the hospital encounter of 06/03/18 (from the past 48 hour(s))  Protime-INR     Status: Abnormal   Collection Time: 06/03/18  2:43 PM  Result Value Ref Range   Prothrombin Time 21.6 (H) 11.4 - 15.2 seconds   INR 1.90     Comment: Performed at [facility], [address] [place], [place] [address]  APTT     Status: Abnormal   Collection Time: 06/03/18  2:43 PM  Result Value Ref Range   aPTT 47 (H) 24 - 36 seconds    Comment:        IF BASELINE aPTT IS ELEVATED, SUGGEST PATIENT RISK ASSESSMENT BE USED TO DETERMINE APPROPRIATE ANTICOAGULANT THERAPY. Performed at [facility], [address] [place], [place] [address]   CBC     Status: Abnormal   Collection Time: 06/03/18  2:43 PM  Result Value Ref Range   WBC 7.6 3.8 - 10.6 K/uL   RBC 3.87 (L) 4.40 - 5.90 MIL/uL   Hemoglobin 9.0 (L) 13.0 - 18.0 g/dL   HCT 28.6 (L) 40.0 - 52.0 %   MCV 73.9 (L) 80.0 - 100.0 fL   MCH 23.3 (L) 26.0 - 34.0 pg   MCHC 31.5 (L) 32.0 - 36.0 g/dL   RDW 17.9 (H) 11.5 - 14.5 %   Platelets 275 150 - 440 K/uL    Comment: Performed at [facility], [address] [place], [place] [address]  Differential     Status: Abnormal   Collection Time: 06/03/18  2:43 PM  Result Value Ref Range   Neutrophils Relative % 80 %   Neutro  Abs 6.1 1.4 - 6.5 K/uL   Lymphocytes Relative 10 %   Lymphs Abs 0.7 (L) 1.0 - 3.6 K/uL   Monocytes Relative 8 %   Monocytes Absolute 0.6 0.2 - 1.0 K/uL   Eosinophils Relative 1 %   Eosinophils Absolute 0.0 0 - 0.7 K/uL   Basophils Relative 1 %   Basophils Absolute 0.1 0 - 0.1 K/uL    Comment: Performed at [facility], [address] [place], [place] [address]  Comprehensive metabolic panel     Status: Abnormal   Collection Time: 06/03/18  2:43 PM  Result Value Ref Range   Sodium 140 135 - 145 mmol/L   Potassium 3.7 3.5 - 5.1 mmol/L   Chloride 101 101 - 111 mmol/L   CO2 28 22 - 32 mmol/L   Glucose, Bld 119 (H) 65 - 99 mg/dL   BUN 31 (H) 6 - 20 mg/dL   Creatinine, Ser 1.04 0.61 - 1.24 mg/dL   Calcium 9.3 8.9 - 10.3 mg/dL   Total Protein 6.4 (L) 6.5 - 8.1 g/dL   Albumin 2.7 (L) 3.5 - 5.0 g/dL   AST 20 15 - 41 U/L   ALT <5 (L) 17 - 63 U/L   Alkaline Phosphatase 62 38 - 126 U/L   Total Bilirubin 1.1 0.3 - 1.2 mg/dL   GFR calc non Af Amer >60 >60 mL/min   GFR calc Af Amer >60 >60 mL/min    Comment: (NOTE) The eGFR has been calculated using the CKD EPI equation. This calculation has not been validated in all clinical situations. eGFR's persistently <60 mL/min signify possible Chronic Kidney Disease.    Anion gap 11 5 - 15    Comment: Performed at [facility], [address] [place], [place] [address]  Troponin I     Status: Abnormal   Collection Time: 06/03/18  2:43 PM  Result Value Ref Range   Troponin I 0.07 (HH) <0.03 ng/mL    Comment: CRITICAL RESULT CALLED TO, READ BACK BY AND VERIFIED WITH STEPHEN JONES AT 1527 06/03/2018.  TFK Performed at [facility], [address] [place], [place] [address]   Urinalysis, Complete w Microscopic     Status: Abnormal   Collection  Time: 06/03/18  2:43 PM  Result Value Ref Range   Color, Urine YELLOW (A) YELLOW   APPearance CLEAR (A) CLEAR   Specific Gravity, Urine 1.020 1.005 - 1.030   pH 5.0 5.0 - 8.0   Glucose, UA NEGATIVE NEGATIVE mg/dL   Hgb urine dipstick NEGATIVE NEGATIVE   Bilirubin Urine NEGATIVE NEGATIVE   Ketones, ur 5 (A) NEGATIVE mg/dL   Protein, ur NEGATIVE NEGATIVE mg/dL   Nitrite NEGATIVE NEGATIVE   Leukocytes, UA NEGATIVE NEGATIVE   RBC / HPF 6-10 0 - 5 RBC/hpf   WBC, UA 0-5 0 - 5 WBC/hpf   Bacteria, UA NONE SEEN NONE SEEN   Squamous Epithelial / LPF NONE SEEN 0 - 5   Mucus PRESENT    Hyaline Casts, UA PRESENT     Comment: Performed at [facility], [address] [place],  [address]   Ct Angio Head W Or Wo Contrast  Result Date: 06/03/2018 CLINICAL DATA:  Neck pain radiating to back, unable to move, incontinent. Follow-up suspected stroke. History of stroke, hypertension, hyperlipidemia, Parkinson's disease/dementia. EXAM: CT ANGIOGRAPHY HEAD AND NECK TECHNIQUE: Multidetector CT imaging of the head and neck was performed using the standard protocol during bolus administration of intravenous contrast. Multiplanar CT  image reconstructions and MIPs were obtained to evaluate the vascular anatomy. Carotid stenosis measurements (when applicable) are obtained utilizing NASCET criteria, using the distal internal carotid diameter as the denominator. CONTRAST:  175m ISOVUE-370 IOPAMIDOL (ISOVUE-370) INJECTION 76% COMPARISON:  CT HEAD June 03, 2018 FINDINGS: CTA NECK FINDINGS: AORTIC ARCH: Normal appearance of the thoracic arch, normal branch pattern. The origins of the innominate, left Common carotid artery and subclavian artery are widely patent. RIGHT CAROTID SYSTEM: Common carotid artery is patent. Mild calcific atherosclerosis of the carotid bifurcation without hemodynamically significant stenosis by NASCET criteria. Internal carotid artery with mild luminal irregularity most compatible  with atherosclerosis. LEFT CAROTID SYSTEM: Common carotid artery is patent. Trace atherosclerosis of the carotid bifurcation without hemodynamically significant stenosis by NASCET criteria. Normal appearance of the internal carotid artery. VERTEBRAL ARTERIES:Severe stenosis RIGHT vertebral artery origin. LEFT vertebral artery is dominant. Mild extrinsic deformity of the vertebral bodies due to degenerative cervical spine. SKELETON: No acute osseous process though bone windows have not been submitted. Degenerative cervical spine resulting in severe canal stenosis C6-7. OTHER NECK: Soft tissues of the neck are nonacute though, not tailored for evaluation. UPPER CHEST: Partially imaged moderate RIGHT and small LEFT pleural effusions. LEFT cardiac pacemaker. CTA HEAD FINDINGS: ANTERIOR CIRCULATION: Patent cervical internal carotid arteries, petrous, cavernous and supra clinoid internal carotid arteries. Patent anterior communicating artery. Patent anterior and middle cerebral arteries. No large vessel occlusion, significant stenosis, contrast extravasation or aneurysm. POSTERIOR CIRCULATION: Patent vertebral arteries, vertebrobasilar junction and basilar artery, as well as main branch vessels. Moderate luminal irregularity bilateral vertebral arteries compatible with atherosclerosis. Patent posterior cerebral arteries. No large vessel occlusion, significant stenosis, contrast extravasation or aneurysm. VENOUS SINUSES: Major dural venous sinuses are patent though not tailored for evaluation on this angiographic examination. ANATOMIC VARIANTS: None. DELAYED PHASE: No abnormal intracranial enhancement. MIP images reviewed. IMPRESSION: CTA NECK: 1. No hemodynamically significant stenosis of the carotid arteries. 2. Severe stenosis RIGHT vertebral artery origin. Patent bilateral vertebral arteries. 3. Severe canal stenosis C6-7. 4. Moderate RIGHT small LEFT pleural effusions. CTA HEAD: 1. No emergent large vessel occlusion  or flow-limiting stenosis. 2. Moderate vertebral artery atherosclerosis. Aortic Atherosclerosis (ICD10-I70.0). Electronically Signed   By: CElon AlasM.D.   On: 06/03/2018 17:35   Ct Head Wo Contrast  Result Date: 06/03/2018 CLINICAL DATA:  Mental status change. EXAM: CT HEAD WITHOUT CONTRAST TECHNIQUE: Contiguous axial images were obtained from the base of the skull through the vertex without intravenous contrast. COMPARISON:  10/23/2017 FINDINGS: Brain: Stable age related cerebral atrophy, ventriculomegaly and periventricular white matter disease. No extra-axial fluid collections are identified. No CT findings for acute hemispheric infarction or intracranial hemorrhage. Probable remote high right posterior parietal white matter infarct, unchanged. No mass lesions. The brainstem and cerebellum are normal. Vascular: No worrisome vascular calcifications or obvious aneurysm. Skull: No skull fracture or bone lesion. Sinuses/Orbits: Mucoperiosteal thickening involving the left maxillary sinus. The other paranasal sinuses are clear. Other: No scalp lesions or hematoma. IMPRESSION: 1. Advanced cerebral atrophy and patchy deep periventricular white matter disease, unchanged. 2. Encephalomalacia in the high right parietal area posteriorly possibly due to remote white matter infarct. 3. No acute intracranial findings, skull fracture or mass lesions. Electronically Signed   By: PMarijo SanesM.D.   On: 06/03/2018 15:28   Ct Angio Neck W And/or Wo Contrast  Result Date: 06/03/2018 CLINICAL DATA:  Neck pain radiating to back, unable to move, incontinent. Follow-up suspected stroke. History of stroke, hypertension, hyperlipidemia, Parkinson's disease/dementia. EXAM: CT  ANGIOGRAPHY HEAD AND NECK TECHNIQUE: Multidetector CT imaging of the head and neck was performed using the standard protocol during bolus administration of intravenous contrast. Multiplanar CT image reconstructions and MIPs were obtained to evaluate  the vascular anatomy. Carotid stenosis measurements (when applicable) are obtained utilizing NASCET criteria, using the distal internal carotid diameter as the denominator. CONTRAST:  119m ISOVUE-370 IOPAMIDOL (ISOVUE-370) INJECTION 76% COMPARISON:  CT HEAD June 03, 2018 FINDINGS: CTA NECK FINDINGS: AORTIC ARCH: Normal appearance of the thoracic arch, normal branch pattern. The origins of the innominate, left Common carotid artery and subclavian artery are widely patent. RIGHT CAROTID SYSTEM: Common carotid artery is patent. Mild calcific atherosclerosis of the carotid bifurcation without hemodynamically significant stenosis by NASCET criteria. Internal carotid artery with mild luminal irregularity most compatible with atherosclerosis. LEFT CAROTID SYSTEM: Common carotid artery is patent. Trace atherosclerosis of the carotid bifurcation without hemodynamically significant stenosis by NASCET criteria. Normal appearance of the internal carotid artery. VERTEBRAL ARTERIES:Severe stenosis RIGHT vertebral artery origin. LEFT vertebral artery is dominant. Mild extrinsic deformity of the vertebral bodies due to degenerative cervical spine. SKELETON: No acute osseous process though bone windows have not been submitted. Degenerative cervical spine resulting in severe canal stenosis C6-7. OTHER NECK: Soft tissues of the neck are nonacute though, not tailored for evaluation. UPPER CHEST: Partially imaged moderate RIGHT and small LEFT pleural effusions. LEFT cardiac pacemaker. CTA HEAD FINDINGS: ANTERIOR CIRCULATION: Patent cervical internal carotid arteries, petrous, cavernous and supra clinoid internal carotid arteries. Patent anterior communicating artery. Patent anterior and middle cerebral arteries. No large vessel occlusion, significant stenosis, contrast extravasation or aneurysm. POSTERIOR CIRCULATION: Patent vertebral arteries, vertebrobasilar junction and basilar artery, as well as main branch vessels. Moderate  luminal irregularity bilateral vertebral arteries compatible with atherosclerosis. Patent posterior cerebral arteries. No large vessel occlusion, significant stenosis, contrast extravasation or aneurysm. VENOUS SINUSES: Major dural venous sinuses are patent though not tailored for evaluation on this angiographic examination. ANATOMIC VARIANTS: None. DELAYED PHASE: No abnormal intracranial enhancement. MIP images reviewed. IMPRESSION: CTA NECK: 1. No hemodynamically significant stenosis of the carotid arteries. 2. Severe stenosis RIGHT vertebral artery origin. Patent bilateral vertebral arteries. 3. Severe canal stenosis C6-7. 4. Moderate RIGHT small LEFT pleural effusions. CTA HEAD: 1. No emergent large vessel occlusion or flow-limiting stenosis. 2. Moderate vertebral artery atherosclerosis. Aortic Atherosclerosis (ICD10-I70.0). Electronically Signed   By: CElon AlasM.D.   On: 06/03/2018 17:35   Dg Chest Portable 1 View  Result Date: 06/03/2018 CLINICAL DATA:  Generalized weakness EXAM: PORTABLE CHEST 1 VIEW COMPARISON:  03/06/2018 FINDINGS: Cardiac shadow is enlarged. Pacing device is again seen. The lungs are well aerated without focal infiltrate or sizable effusion. Mild chronic interstitial changes are again seen and stable. No acute bony abnormality is noted. IMPRESSION: Chronic interstitial changes without acute abnormality. Electronically Signed   By: MInez CatalinaM.D.   On: 06/03/2018 15:58    Review of Systems  Constitutional: Negative for chills and fever.  HENT: Negative for sore throat and tinnitus.   Eyes: Negative for blurred vision and redness.  Respiratory: Negative for cough and shortness of breath.   Cardiovascular: Negative for chest pain, palpitations, orthopnea and PND.  Gastrointestinal: Negative for abdominal pain, diarrhea, nausea and vomiting.  Genitourinary: Negative for dysuria, frequency and urgency.  Musculoskeletal: Negative for joint pain and myalgias.  Skin:  Negative for rash.       No lesions  Neurological: Positive for focal weakness and weakness. Negative for speech change.  Endo/Heme/Allergies: Does not bruise/bleed  easily.       No temperature intolerance  Psychiatric/Behavioral: Negative for depression and suicidal ideas.    Blood pressure (!) 148/75, pulse 83, temperature 98.3 F (36.8 C), temperature source Oral, resp. rate (!) 30, weight 84.4 kg (186 lb), SpO2 94 %. Physical Exam  Constitutional: He is oriented to person, place, and time. He appears well-developed and well-nourished. No distress.  HENT:  Head: Normocephalic and atraumatic.  Mouth/Throat: Oropharynx is clear and moist.  Eyes: Pupils are equal, round, and reactive to light. Conjunctivae and EOM are normal. No scleral icterus.  Neck: Normal range of motion. Neck supple. No JVD present. No tracheal deviation present. No thyromegaly present.  Cardiovascular: Normal rate, regular rhythm and normal heart sounds. Exam reveals no gallop and no friction rub.  No murmur heard. Respiratory: Effort normal and breath sounds normal. No respiratory distress.  GI: Soft. Bowel sounds are normal. He exhibits distension. He exhibits no mass. There is no tenderness. There is no rebound and no guarding.  Genitourinary:  Genitourinary Comments: Deferred  Musculoskeletal: Normal range of motion. He exhibits edema.  Lymphadenopathy:    He has no cervical adenopathy.  Neurological: He is alert and oriented to person, place, and time. He displays no tremor. No cranial nerve deficit or sensory deficit.  The patient can only wiggle his feet at the ankles.  0 out of 5 strength in upper extremities bilaterally  Skin: Skin is warm and dry. No rash noted. No erythema.  Psychiatric: He has a normal mood and affect. His behavior is normal. Judgment and thought content normal.     Assessment/Plan This is an 84 year old male admitted for weakness. 1.  Weakness: Generalized with new right-sided  focal weakness of his left upper extremity.  The patient can still wiggle his toes.  He also has intact sensation but now he appears to have some difficulty swallowing.  Remain n.p.o. and consult neurology.   The patient has a pacemaker and thus is not a candidate for MRI.  Continue aspirin.  Differential diagnosis includes stroke versus progression of Parkinson's disease. 2.  Atrial fibrillation: Rate controlled; status post pacemaker.  Continue metoprolol and Eliquis 3.  CHF: Acute on chronic per report.  Systolic; continue torsemide.  I have ordered another echocardiogram (last study March 2019 showed 25 to 30% EF).  4.  BPH: Continue finasteride and terazosin 5.  Parkinson's disease: Continue Sinemet 6.  Hyperlipidemia: Continue statin therapy 7.  Constipation: Continue Metamucil and psilocybin packet.  The patient has had difficulty moving his bowels lately 8.  DVT prophylaxis: Full dose anticoagulation 9.  GI prophylaxis: Pantoprazole per home regimen The patient is a DO NOT INTUBATE.  Time spent on admission orders and patient care approximately 45 minutes  Harrie Foreman, MD 06/03/2018, 9:53 PM

## 2018-06-03 NOTE — ED Notes (Signed)
Report received from Steven RN.

## 2018-06-04 ENCOUNTER — Observation Stay
Admit: 2018-06-04 | Discharge: 2018-06-04 | Disposition: A | Discharge status: Home / Self Care | Payer: Medicare Other | Attending: Internal Medicine | Admitting: Internal Medicine

## 2018-06-04 DIAGNOSIS — G8250 Quadriplegia, unspecified: Secondary | ICD-10-CM

## 2018-06-04 DIAGNOSIS — R531 Weakness: Secondary | ICD-10-CM

## 2018-06-04 DIAGNOSIS — I509 Heart failure, unspecified: Secondary | ICD-10-CM | POA: Diagnosis not present

## 2018-06-04 DIAGNOSIS — Z950 Presence of cardiac pacemaker: Secondary | ICD-10-CM | POA: Diagnosis not present

## 2018-06-04 DIAGNOSIS — I639 Cerebral infarction, unspecified: Secondary | ICD-10-CM | POA: Diagnosis present

## 2018-06-04 DIAGNOSIS — I6389 Other cerebral infarction: Secondary | ICD-10-CM | POA: Diagnosis not present

## 2018-06-04 DIAGNOSIS — Z7951 Long term (current) use of inhaled steroids: Secondary | ICD-10-CM | POA: Diagnosis not present

## 2018-06-04 DIAGNOSIS — I499 Cardiac arrhythmia, unspecified: Secondary | ICD-10-CM

## 2018-06-04 DIAGNOSIS — I10 Essential (primary) hypertension: Secondary | ICD-10-CM | POA: Diagnosis not present

## 2018-06-04 DIAGNOSIS — I69354 Hemiplegia and hemiparesis following cerebral infarction affecting left non-dominant side: Secondary | ICD-10-CM | POA: Diagnosis not present

## 2018-06-04 DIAGNOSIS — M4802 Spinal stenosis, cervical region: Secondary | ICD-10-CM | POA: Diagnosis not present

## 2018-06-04 DIAGNOSIS — I6501 Occlusion and stenosis of right vertebral artery: Secondary | ICD-10-CM

## 2018-06-04 DIAGNOSIS — I110 Hypertensive heart disease with heart failure: Secondary | ICD-10-CM | POA: Diagnosis present

## 2018-06-04 DIAGNOSIS — Z8673 Personal history of transient ischemic attack (TIA), and cerebral infarction without residual deficits: Secondary | ICD-10-CM | POA: Diagnosis not present

## 2018-06-04 DIAGNOSIS — R29898 Other symptoms and signs involving the musculoskeletal system: Secondary | ICD-10-CM | POA: Diagnosis not present

## 2018-06-04 DIAGNOSIS — M6281 Muscle weakness (generalized): Secondary | ICD-10-CM | POA: Diagnosis not present

## 2018-06-04 DIAGNOSIS — G20 Parkinson's disease: Secondary | ICD-10-CM | POA: Diagnosis present

## 2018-06-04 DIAGNOSIS — Z823 Family history of stroke: Secondary | ICD-10-CM | POA: Diagnosis not present

## 2018-06-04 DIAGNOSIS — Z66 Do not resuscitate: Secondary | ICD-10-CM | POA: Diagnosis present

## 2018-06-04 DIAGNOSIS — G8929 Other chronic pain: Secondary | ICD-10-CM | POA: Diagnosis present

## 2018-06-04 DIAGNOSIS — Z79899 Other long term (current) drug therapy: Secondary | ICD-10-CM | POA: Diagnosis not present

## 2018-06-04 DIAGNOSIS — N400 Enlarged prostate without lower urinary tract symptoms: Secondary | ICD-10-CM | POA: Diagnosis present

## 2018-06-04 DIAGNOSIS — I248 Other forms of acute ischemic heart disease: Secondary | ICD-10-CM | POA: Diagnosis present

## 2018-06-04 DIAGNOSIS — K219 Gastro-esophageal reflux disease without esophagitis: Secondary | ICD-10-CM | POA: Diagnosis present

## 2018-06-04 DIAGNOSIS — M549 Dorsalgia, unspecified: Secondary | ICD-10-CM | POA: Diagnosis present

## 2018-06-04 DIAGNOSIS — G319 Degenerative disease of nervous system, unspecified: Secondary | ICD-10-CM | POA: Diagnosis not present

## 2018-06-04 DIAGNOSIS — F329 Major depressive disorder, single episode, unspecified: Secondary | ICD-10-CM | POA: Diagnosis present

## 2018-06-04 DIAGNOSIS — I480 Paroxysmal atrial fibrillation: Secondary | ICD-10-CM | POA: Diagnosis present

## 2018-06-04 DIAGNOSIS — E440 Moderate protein-calorie malnutrition: Secondary | ICD-10-CM | POA: Diagnosis present

## 2018-06-04 DIAGNOSIS — I4891 Unspecified atrial fibrillation: Secondary | ICD-10-CM | POA: Diagnosis not present

## 2018-06-04 DIAGNOSIS — G8191 Hemiplegia, unspecified affecting right dominant side: Secondary | ICD-10-CM | POA: Diagnosis present

## 2018-06-04 DIAGNOSIS — Z8249 Family history of ischemic heart disease and other diseases of the circulatory system: Secondary | ICD-10-CM | POA: Diagnosis not present

## 2018-06-04 DIAGNOSIS — E785 Hyperlipidemia, unspecified: Secondary | ICD-10-CM | POA: Diagnosis present

## 2018-06-04 DIAGNOSIS — Z7401 Bed confinement status: Secondary | ICD-10-CM | POA: Diagnosis not present

## 2018-06-04 DIAGNOSIS — K5900 Constipation, unspecified: Secondary | ICD-10-CM | POA: Diagnosis present

## 2018-06-04 DIAGNOSIS — Z7982 Long term (current) use of aspirin: Secondary | ICD-10-CM | POA: Diagnosis not present

## 2018-06-04 DIAGNOSIS — Z6826 Body mass index (BMI) 26.0-26.9, adult: Secondary | ICD-10-CM | POA: Diagnosis not present

## 2018-06-04 DIAGNOSIS — Z7901 Long term (current) use of anticoagulants: Secondary | ICD-10-CM | POA: Diagnosis not present

## 2018-06-04 DIAGNOSIS — Z85828 Personal history of other malignant neoplasm of skin: Secondary | ICD-10-CM | POA: Diagnosis not present

## 2018-06-04 LAB — LIPID PANEL
Cholesterol: 97 mg/dL (ref 0–200)
HDL: 34 mg/dL — ABNORMAL LOW (ref >40)
LDL Cholesterol: 45 mg/dL (ref 0–99)
Total CHOL/HDL Ratio: 2.9 RATIO
Triglycerides: 92 mg/dL (ref <150)
VLDL: 18 mg/dL (ref 0–40)

## 2018-06-04 LAB — TROPONIN I
Troponin I: 0.07 ng/mL (ref <0.03)
Troponin I: 0.07 ng/mL (ref <0.03)

## 2018-06-04 LAB — ECHOCARDIOGRAM COMPLETE
Height: 68 in
Weight: 2900.8 oz

## 2018-06-04 LAB — HEMOGLOBIN A1C
Hgb A1c MFr Bld: 6.4 % — ABNORMAL HIGH (ref 4.8–5.6)
Mean Plasma Glucose: 136.98 mg/dL

## 2018-06-04 LAB — MRSA PCR SCREENING: MRSA by PCR: NEGATIVE

## 2018-06-04 MED ORDER — CARBIDOPA-LEVODOPA 25-100 MG PO TABS
1.0000 | ORAL_TABLET | Freq: Three times a day (TID) | ORAL | Status: DC
  Administered 2018-06-04 – 2018-06-06 (×8): 1 via ORAL
  Filled 2018-06-04 (×9): qty 1

## 2018-06-04 NOTE — Consult Note (Signed)
Cardiology Consultation Note    Patient ID: Danny Grimes, MRN: [id number], DOB/AGE: 82-Nov-1929 82 y.o. Admit date: 06/03/2018   Date of Consult: 06/04/2018 Primary Physician: System, Pcp Not In Primary Cardiologist:    Chief Complaint: left sided weakness and confusion Reason for Consultation: afib and cva Requesting MD: Dr. Benjie Karvonen  HPI: Danny Grimes is a 84 y.o. male with history of Parkinson's disease, history of a permanent pacemaker, history of hyperlipidemia, history of congestive heart failure with a known ejection fraction of 20 to 25% who was admitted from his care facility with what was felt to be more confused than usual.  He has a permanent pacemaker that was placed at [facility].  The last time it appears to been evaluated was in 2014. He is in afib and not felt to be a candidate for anticoagulation as an outpatient due to fall risk.  Now admitted with probable stroke.  Not able to move his right side.  EKG reveals atrial fibrillation with ventricular paced rhythm.  Brain CT revealed advanced cerebral atrophy.  No acute intracranial findings.  CT angiogram neck and head showed severe stenosis of the right vertebral artery, no significant stenosis of the carotid arteries.  No emergent large vessel occlusion or flow-limiting stenosis in the head.  Patient was placed on Eliquis and is currently tolerating this fairly well.  Is also on aspirin.  Will likely discontinue this while on Eliquis.  Past Medical History:  Diagnosis Date  . Adenocarcinoma ([place])   . Anxiety   . Bilateral renal masses   . CHF (congestive heart failure) ([place])   . Colon polyp   . Degenerative joint disease   . Depression   . DNR (do not resuscitate)   . Fitting and adjustment of cardiac pacemaker   . Hemiparesis affecting left side as late effect of cerebrovascular accident (CVA) ([place])   . Hyperlipemia   . Hypertension   . Parkinson disease ([place])   . Paroxysmal atrial fibrillation ([facility])    . Peripheral arterial disease ([place])   . Skin cancer       Surgical History:  Past Surgical History:  Procedure Laterality Date  . insert / replace/ remove peacemaker       Home Meds: Prior to Admission medications   Medication Sig Start Date End Date Taking? Authorizing Provider  acetaminophen (TYLENOL) 325 MG tablet Take 650 mg by mouth 3 (three) times daily.    Yes [provider]  apixaban (ELIQUIS) 5 MG TABS tablet Take 5 mg by mouth every 12 (twelve) hours.    Yes [provider]  aspirin 81 MG chewable tablet Chew 81 mg by mouth daily.   Yes [provider]  atorvastatin (LIPITOR) 80 MG tablet Take 40 mg by mouth at bedtime.   Yes [provider]  Calcium Carbonate-Vitamin D 600-400 MG-UNIT tablet Take 2 tablets by mouth daily with breakfast.   Yes [provider]  carbidopa-levodopa (SINEMET IR) 25-100 MG tablet Take 2 tablets by mouth 3 (three) times daily.    Yes [provider]  carboxymethylcellulose (REFRESH TEARS) 0.5 % SOLN Place 1 drop into both eyes 3 (three) times daily as needed (for dry eyes).   Yes [provider]  Cholecalciferol 10000 units TABS Take 1,000 Units by mouth daily.    Yes [provider]  DULoxetine (CYMBALTA) 30 MG capsule Take 90 mg by mouth daily.   Yes [provider]  finasteride (PROSCAR) 5 MG tablet Take  5 mg by mouth daily.    Yes [provider]  fluticasone (FLONASE) 50 MCG/ACT nasal spray Place 1 spray into both nostrils daily.   Yes [provider]  folic acid (FOLVITE) 1 MG tablet Take 1 mg by mouth daily.   Yes [provider]  ibuprofen (ADVIL,MOTRIN) 400 MG tablet Take 400 mg by mouth as needed for mild pain or moderate pain.   Yes [provider]  loratadine (CLARITIN) 10 MG tablet Take 10 mg by mouth daily as needed for allergies.   Yes [provider]  Melatonin 3 MG TABS Take 6 mg by mouth at bedtime.    Yes  [provider]  Menthol-Methyl Salicylate (THERA-GESIC EX) Apply topically 3 (three) times daily as needed (for pain on buttocks, hips, and ankle).   Yes [provider]  metoprolol succinate (TOPROL-XL) 50 MG 24 hr tablet Take 25 mg by mouth daily. Take with or immediately following a meal.   Yes [provider]  miconazole (MICOTIN) 2 % cream Apply 1 application topically 2 (two) times daily as needed (for fungal infection).   Yes [provider]  OLANZapine (ZYPREXA) 2.5 MG tablet Take 2.5 mg by mouth at bedtime.   Yes [provider]  omega-3 acid ethyl esters (LOVAZA) 1 g capsule Take 1 g by mouth 2 (two) times daily.   Yes [provider]  pantoprazole (PROTONIX) 40 MG tablet Take 40 mg by mouth 2 (two) times daily.   Yes [provider]  Petrolatum-Zinc Oxide (SENSI-CARE PROTECTIVE BARRIER EX) Apply 1 application topically 2 (two) times daily.   Yes [provider]  psyllium (HYDROCIL/METAMUCIL) 95 % PACK Take 1 packet by mouth daily.   Yes [provider]  Salicylic Acid 6 % SHAM Apply topically every other day.   Yes [provider]  senna-docusate (SENOKOT-S) 8.6-50 MG tablet Take 1 tablet by mouth 2 (two) times daily.   Yes [provider]  terazosin (HYTRIN) 5 MG capsule Take 5 mg by mouth at bedtime.   Yes [provider]  torsemide (DEMADEX) 10 MG tablet Take 10 mg by mouth daily.   Yes [provider]  vitamin B-12 (CYANOCOBALAMIN) 1000 MCG tablet Take 1,000 mcg by mouth daily.   Yes [provider]  furosemide (LASIX) 20 MG tablet Take 1 tablet (20 mg total) by mouth daily. Patient not taking: Reported on 03/06/2018 02/22/18 02/22/19  Salary, Avel Peace, MD    Inpatient Medications:  . apixaban  5 mg Oral Q12H  . aspirin  81 mg Oral Daily  . atorvastatin  40 mg Oral QHS  . calcium-vitamin D  2 tablet Oral Q breakfast  . carbidopa-levodopa  2 tablet Oral TID   . cholecalciferol  1,000 Units Oral Daily  . docusate sodium  100 mg Oral BID  . DULoxetine  90 mg Oral Daily  . finasteride  5 mg Oral Daily  . fluticasone  1 spray Each Nare Daily  . folic acid  1 mg Oral Daily  . mouth rinse  15 mL Mouth Rinse BID  . Melatonin  5 mg Oral QHS  . metoprolol succinate  25 mg Oral Daily  . OLANZapine  2.5 mg Oral QHS  . omega-3 acid ethyl esters  1 g Oral BID  . pantoprazole  40 mg Oral BID  . psyllium  1 packet Oral Daily  . senna-docusate  1 tablet Oral BID  . terazosin  5 mg Oral QHS  .  torsemide  10 mg Oral Daily  . vitamin B-12  1,000 mcg Oral Daily     Allergies: No Known Allergies  Social History   Socioeconomic History  . Marital status: Widowed    Spouse name: Not on file  . Number of children: Not on file  . Years of education: Not on file  . Highest education level: Not on file  Occupational History  . Not on file  Social Needs  . Financial resource strain: Not on file  . Food insecurity:    Worry: Not on file    Inability: Not on file  . Transportation needs:    Medical: Not on file    Non-medical: Not on file  Tobacco Use  . Smoking status: Former Research scientist (life sciences)  . Smokeless tobacco: Never Used  Substance and Sexual Activity  . Alcohol use: No  . Drug use: No  . Sexual activity: Not on file  Lifestyle  . Physical activity:    Days per week: Not on file    Minutes per session: Not on file  . Stress: Not on file  Relationships  . Social connections:    Talks on phone: Not on file    Gets together: Not on file    Attends religious service: Not on file    Active member of club or organization: Not on file    Attends meetings of clubs or organizations: Not on file    Relationship status: Not on file  . Intimate partner violence:    Fear of current or ex partner: Not on file    Emotionally abused: Not on file    Physically abused: Not on file    Forced sexual activity: Not on file  Other Topics Concern  . Not on file   Social History Narrative  . Not on file     History reviewed. No pertinent family history.   Review of Systems: A 12-system review of systems was performed and is negative except as noted in the HPI.  Labs: Recent Labs    06/03/18 1443 06/03/18 2156 06/04/18 0338 06/04/18 0943  TROPONINI 0.07* 0.07* 0.07* 0.07*   Lab Results  Component Value Date   WBC 7.6 06/03/2018   HGB 9.0 (L) 06/03/2018   HCT 28.6 (L) 06/03/2018   MCV 73.9 (L) 06/03/2018   PLT 275 06/03/2018    Recent Labs  Lab 06/03/18 1443  NA 140  K 3.7  CL 101  CO2 28  BUN 31*  CREATININE 1.04  CALCIUM 9.3  PROT 6.4*  BILITOT 1.1  ALKPHOS 62  ALT <5*  AST 20  GLUCOSE 119*   Lab Results  Component Value Date   CHOL 97 06/04/2018   HDL 34 (L) 06/04/2018   LDLCALC 45 06/04/2018   TRIG 92 06/04/2018   No results found for: DDIMER  Radiology/Studies:  Ct Angio Head W Or Wo Contrast  Result Date: 06/03/2018 CLINICAL DATA:  Neck pain radiating to back, unable to move, incontinent. Follow-up suspected stroke. History of stroke, hypertension, hyperlipidemia, Parkinson's disease/dementia. EXAM: CT ANGIOGRAPHY HEAD AND NECK TECHNIQUE: Multidetector CT imaging of the head and neck was performed using the standard protocol during bolus administration of intravenous contrast. Multiplanar CT image reconstructions and MIPs were obtained to evaluate the vascular anatomy. Carotid stenosis measurements (when applicable) are obtained utilizing NASCET criteria, using the distal internal carotid diameter as the denominator. CONTRAST:  162mL ISOVUE-370 IOPAMIDOL (ISOVUE-370) INJECTION 76% COMPARISON:  CT HEAD June 03, 2018 FINDINGS: CTA NECK FINDINGS:  AORTIC ARCH: Normal appearance of the thoracic arch, normal branch pattern. The origins of the innominate, left Common carotid artery and subclavian artery are widely patent. RIGHT CAROTID SYSTEM: Common carotid artery is patent. Mild calcific atherosclerosis of the carotid  bifurcation without hemodynamically significant stenosis by NASCET criteria. Internal carotid artery with mild luminal irregularity most compatible with atherosclerosis. LEFT CAROTID SYSTEM: Common carotid artery is patent. Trace atherosclerosis of the carotid bifurcation without hemodynamically significant stenosis by NASCET criteria. Normal appearance of the internal carotid artery. VERTEBRAL ARTERIES:Severe stenosis RIGHT vertebral artery origin. LEFT vertebral artery is dominant. Mild extrinsic deformity of the vertebral bodies due to degenerative cervical spine. SKELETON: No acute osseous process though bone windows have not been submitted. Degenerative cervical spine resulting in severe canal stenosis C6-7. OTHER NECK: Soft tissues of the neck are nonacute though, not tailored for evaluation. UPPER CHEST: Partially imaged moderate RIGHT and small LEFT pleural effusions. LEFT cardiac pacemaker. CTA HEAD FINDINGS: ANTERIOR CIRCULATION: Patent cervical internal carotid arteries, petrous, cavernous and supra clinoid internal carotid arteries. Patent anterior communicating artery. Patent anterior and middle cerebral arteries. No large vessel occlusion, significant stenosis, contrast extravasation or aneurysm. POSTERIOR CIRCULATION: Patent vertebral arteries, vertebrobasilar junction and basilar artery, as well as main branch vessels. Moderate luminal irregularity bilateral vertebral arteries compatible with atherosclerosis. Patent posterior cerebral arteries. No large vessel occlusion, significant stenosis, contrast extravasation or aneurysm. VENOUS SINUSES: Major dural venous sinuses are patent though not tailored for evaluation on this angiographic examination. ANATOMIC VARIANTS: None. DELAYED PHASE: No abnormal intracranial enhancement. MIP images reviewed. IMPRESSION: CTA NECK: 1. No hemodynamically significant stenosis of the carotid arteries. 2. Severe stenosis RIGHT vertebral artery origin. Patent bilateral  vertebral arteries. 3. Severe canal stenosis C6-7. 4. Moderate RIGHT small LEFT pleural effusions. CTA HEAD: 1. No emergent large vessel occlusion or flow-limiting stenosis. 2. Moderate vertebral artery atherosclerosis. Aortic Atherosclerosis (ICD10-I70.0). Electronically Signed   By: Elon Alas M.D.   On: 06/03/2018 17:35   Ct Head Wo Contrast  Result Date: 06/03/2018 CLINICAL DATA:  Mental status change. EXAM: CT HEAD WITHOUT CONTRAST TECHNIQUE: Contiguous axial images were obtained from the base of the skull through the vertex without intravenous contrast. COMPARISON:  10/23/2017 FINDINGS: Brain: Stable age related cerebral atrophy, ventriculomegaly and periventricular white matter disease. No extra-axial fluid collections are identified. No CT findings for acute hemispheric infarction or intracranial hemorrhage. Probable remote high right posterior parietal white matter infarct, unchanged. No mass lesions. The brainstem and cerebellum are normal. Vascular: No worrisome vascular calcifications or obvious aneurysm. Skull: No skull fracture or bone lesion. Sinuses/Orbits: Mucoperiosteal thickening involving the left maxillary sinus. The other paranasal sinuses are clear. Other: No scalp lesions or hematoma. IMPRESSION: 1. Advanced cerebral atrophy and patchy deep periventricular white matter disease, unchanged. 2. Encephalomalacia in the high right parietal area posteriorly possibly due to remote white matter infarct. 3. No acute intracranial findings, skull fracture or mass lesions. Electronically Signed   By: Marijo Sanes M.D.   On: 06/03/2018 15:28   Ct Angio Neck W And/or Wo Contrast  Result Date: 06/03/2018 CLINICAL DATA:  Neck pain radiating to back, unable to move, incontinent. Follow-up suspected stroke. History of stroke, hypertension, hyperlipidemia, Parkinson's disease/dementia. EXAM: CT ANGIOGRAPHY HEAD AND NECK TECHNIQUE: Multidetector CT imaging of the head and neck was performed using  the standard protocol during bolus administration of intravenous contrast. Multiplanar CT image reconstructions and MIPs were obtained to evaluate the vascular anatomy. Carotid stenosis measurements (when applicable) are obtained utilizing NASCET criteria,  using the distal internal carotid diameter as the denominator. CONTRAST:  169mL ISOVUE-370 IOPAMIDOL (ISOVUE-370) INJECTION 76% COMPARISON:  CT HEAD June 03, 2018 FINDINGS: CTA NECK FINDINGS: AORTIC ARCH: Normal appearance of the thoracic arch, normal branch pattern. The origins of the innominate, left Common carotid artery and subclavian artery are widely patent. RIGHT CAROTID SYSTEM: Common carotid artery is patent. Mild calcific atherosclerosis of the carotid bifurcation without hemodynamically significant stenosis by NASCET criteria. Internal carotid artery with mild luminal irregularity most compatible with atherosclerosis. LEFT CAROTID SYSTEM: Common carotid artery is patent. Trace atherosclerosis of the carotid bifurcation without hemodynamically significant stenosis by NASCET criteria. Normal appearance of the internal carotid artery. VERTEBRAL ARTERIES:Severe stenosis RIGHT vertebral artery origin. LEFT vertebral artery is dominant. Mild extrinsic deformity of the vertebral bodies due to degenerative cervical spine. SKELETON: No acute osseous process though bone windows have not been submitted. Degenerative cervical spine resulting in severe canal stenosis C6-7. OTHER NECK: Soft tissues of the neck are nonacute though, not tailored for evaluation. UPPER CHEST: Partially imaged moderate RIGHT and small LEFT pleural effusions. LEFT cardiac pacemaker. CTA HEAD FINDINGS: ANTERIOR CIRCULATION: Patent cervical internal carotid arteries, petrous, cavernous and supra clinoid internal carotid arteries. Patent anterior communicating artery. Patent anterior and middle cerebral arteries. No large vessel occlusion, significant stenosis, contrast extravasation or  aneurysm. POSTERIOR CIRCULATION: Patent vertebral arteries, vertebrobasilar junction and basilar artery, as well as main branch vessels. Moderate luminal irregularity bilateral vertebral arteries compatible with atherosclerosis. Patent posterior cerebral arteries. No large vessel occlusion, significant stenosis, contrast extravasation or aneurysm. VENOUS SINUSES: Major dural venous sinuses are patent though not tailored for evaluation on this angiographic examination. ANATOMIC VARIANTS: None. DELAYED PHASE: No abnormal intracranial enhancement. MIP images reviewed. IMPRESSION: CTA NECK: 1. No hemodynamically significant stenosis of the carotid arteries. 2. Severe stenosis RIGHT vertebral artery origin. Patent bilateral vertebral arteries. 3. Severe canal stenosis C6-7. 4. Moderate RIGHT small LEFT pleural effusions. CTA HEAD: 1. No emergent large vessel occlusion or flow-limiting stenosis. 2. Moderate vertebral artery atherosclerosis. Aortic Atherosclerosis (ICD10-I70.0). Electronically Signed   By: Elon Alas M.D.   On: 06/03/2018 17:35   Dg Chest Portable 1 View  Result Date: 06/03/2018 CLINICAL DATA:  Generalized weakness EXAM: PORTABLE CHEST 1 VIEW COMPARISON:  03/06/2018 FINDINGS: Cardiac shadow is enlarged. Pacing device is again seen. The lungs are well aerated without focal infiltrate or sizable effusion. Mild chronic interstitial changes are again seen and stable. No acute bony abnormality is noted. IMPRESSION: Chronic interstitial changes without acute abnormality. Electronically Signed   By: Inez Catalina M.D.   On: 06/03/2018 15:58    Wt Readings from Last 3 Encounters:  06/04/18 82.2 kg (181 lb 4.8 oz)  03/06/18 80.7 kg (178 lb)  02/21/18 80.9 kg (178 lb 5.6 oz)    EKG: Atrial fibrillation with ventricular pacing  Physical Exam: Blood pressure (!) 144/67, pulse 86, temperature (!) 97.4 F (36.3 C), temperature source Oral, resp. rate 18, height 5\' 8"  (1.727 m), weight 82.2 kg (181  lb 4.8 oz), SpO2 95 %. Body mass index is 27.57 kg/m. General: Well developed, well nourished, in no acute distress. Head: Normocephalic, atraumatic, sclera non-icteric, no xanthomas, nares are without discharge.  Neck: Negative for carotid bruits. JVD not elevated. Lungs: Clear bilaterally to auscultation without wheezes, rales, or rhonchi. Breathing is unlabored. Heart: RRR with S1 S2. No murmurs, rubs, or gallops appreciated. Abdomen: Soft, non-tender, non-distended with normoactive bowel sounds. No hepatomegaly. No rebound/guarding. No obvious abdominal masses. Msk:  Strength and  tone appear normal for age. Extremities: No clubbing or cyanosis. No edema.  Distal pedal pulses are 2+ and equal bilaterally. Neuro: Responds to verbal commands but does not move right side. Psych:       Assessment and Plan  84 year old male with history of sick sinus syndrome with permanent pacemaker.  Unclear where this is followed.  He also has a history of atrial fibrillation not currently anticoagulated as an outpatient apparently due to instability on his feet.  He was placed on Eliquis since hospitalization.  Brain CT revealed nothing acute.  Unless contraindicated from a neurology standpoint we will continue with Eliquis.  We will follow with you.  Signed, Teodoro Spray MD 06/04/2018, 1:03 PM Pager: [phone]

## 2018-06-04 NOTE — Progress Notes (Signed)
OT Cancellation Note  Patient Details Name: Danny Grimes MRN: [id number] DOB: 25-Mar-1928   Cancelled Treatment:    Reason Eval/Treat Not Completed: Other (comment). Order received, chart reviewed. Upon attempt, pt with staff for pt care. Will re-attempt at later time as pt is available.  Jeni Salles, MPH, MS, OTR/L ascom [phone] 06/04/18, 2:14 PM

## 2018-06-04 NOTE — Progress Notes (Addendum)
Text paged hospitalist about troponin lab result of 0.07.   Hospitalist: Labs are consistent with previous results, no new orders at this time.

## 2018-06-04 NOTE — Progress Notes (Signed)
Dr Benjie Karvonen told me she called son and left message on his machine

## 2018-06-04 NOTE — Progress Notes (Signed)
[place] [facility] [place] NAME: Danny Grimes    MR#:  [id number]  DATE OF BIRTH:  September 29, 1928  SUBJECTIVE:   Patient with baseline limited left-sided weakness due to previous stroke and now cannot move right side.  Denies trauma or fall. He has back pain which is chronic. Denies other neurological deficits  REVIEW OF SYSTEMS:    Review of Systems  Constitutional: Negative for fever, chills weight loss Visit of generalized weakness HENT: Negative for ear pain, nosebleeds, congestion, facial swelling, rhinorrhea, neck pain, neck stiffness and ear discharge.   Respiratory: Negative for cough, shortness of breath, wheezing  Cardiovascular: Negative for chest pain, palpitations and leg swelling.  Gastrointestinal: Negative for heartburn, abdominal pain, vomiting, diarrhea or consitpation Genitourinary: Negative for dysuria, urgency, frequency, hematuria Musculoskeletal: Negative for back pain or joint pain Neurological: Negative for dizziness, seizures, syncope, focal weakness,  numbness and headaches.  Positive for Parkinson's disease Unable to move right side which is new for the patient Hematological: Does not bruise/bleed easily.  Psychiatric/Behavioral: Negative for hallucinations, confusion, dysphoric mood    Tolerating Diet: yes      DRUG ALLERGIES:  No Known Allergies  VITALS:  Blood pressure (!) 144/67, pulse 86, temperature (!) 97.4 F (36.3 C), temperature source Oral, resp. rate 18, height 5\' 8"  (1.727 m), weight 82.2 kg (181 lb 4.8 oz), SpO2 95 %.  PHYSICAL EXAMINATION:  Constitutional: Appears well-developed and well-nourished. No distress. Parkinsonian facies HENT: Normocephalic. Marland Kitchen Oropharynx is clear and moist.  Eyes: Conjunctivae and EOM are normal. PERRLA, no scleral icterus.  Neck: Normal ROM. Neck supple. No JVD. No tracheal deviation. CVS: RRR, S1/S2 +, no murmurs, no gallops, no carotid bruit.  Pulmonary: Effort  and breath sounds normal, no stridor, rhonchi, wheezes, rales.  Abdominal: Soft. BS +,  no distension, tenderness, rebound or guarding.  Musculoskeletal: Patient flaccid all extremities  neuro: Alert. CN 2-12 grossly intact.  Skin: Skin is warm and dry.  Psychiatric: Normal mood and affect.      LABORATORY PANEL:   CBC Recent Labs  Lab 06/03/18 1443  WBC 7.6  HGB 9.0*  HCT 28.6*  PLT 275   ------------------------------------------------------------------------------------------------------------------  Chemistries  Recent Labs  Lab 06/03/18 1443  NA 140  K 3.7  CL 101  CO2 28  GLUCOSE 119*  BUN 31*  CREATININE 1.04  CALCIUM 9.3  AST 20  ALT <5*  ALKPHOS 62  BILITOT 1.1   ------------------------------------------------------------------------------------------------------------------  Cardiac Enzymes Recent Labs  Lab 06/03/18 2156 06/04/18 0338 06/04/18 0943  TROPONINI 0.07* 0.07* 0.07*   ------------------------------------------------------------------------------------------------------------------  RADIOLOGY:  Ct Angio Head W Or Wo Contrast  Result Date: 06/03/2018 CLINICAL DATA:  Neck pain radiating to back, unable to move, incontinent. Follow-up suspected stroke. History of stroke, hypertension, hyperlipidemia, Parkinson's disease/dementia. EXAM: CT ANGIOGRAPHY HEAD AND NECK TECHNIQUE: Multidetector CT imaging of the head and neck was performed using the standard protocol during bolus administration of intravenous contrast. Multiplanar CT image reconstructions and MIPs were obtained to evaluate the vascular anatomy. Carotid stenosis measurements (when applicable) are obtained utilizing NASCET criteria, using the distal internal carotid diameter as the denominator. CONTRAST:  167mL ISOVUE-370 IOPAMIDOL (ISOVUE-370) INJECTION 76% COMPARISON:  CT HEAD June 03, 2018 FINDINGS: CTA NECK FINDINGS: AORTIC ARCH: Normal appearance of the thoracic arch, normal  branch pattern. The origins of the innominate, left Common carotid artery and subclavian artery are widely patent. RIGHT CAROTID SYSTEM: Common carotid artery is patent. Mild calcific atherosclerosis of the  carotid bifurcation without hemodynamically significant stenosis by NASCET criteria. Internal carotid artery with mild luminal irregularity most compatible with atherosclerosis. LEFT CAROTID SYSTEM: Common carotid artery is patent. Trace atherosclerosis of the carotid bifurcation without hemodynamically significant stenosis by NASCET criteria. Normal appearance of the internal carotid artery. VERTEBRAL ARTERIES:Severe stenosis RIGHT vertebral artery origin. LEFT vertebral artery is dominant. Mild extrinsic deformity of the vertebral bodies due to degenerative cervical spine. SKELETON: No acute osseous process though bone windows have not been submitted. Degenerative cervical spine resulting in severe canal stenosis C6-7. OTHER NECK: Soft tissues of the neck are nonacute though, not tailored for evaluation. UPPER CHEST: Partially imaged moderate RIGHT and small LEFT pleural effusions. LEFT cardiac pacemaker. CTA HEAD FINDINGS: ANTERIOR CIRCULATION: Patent cervical internal carotid arteries, petrous, cavernous and supra clinoid internal carotid arteries. Patent anterior communicating artery. Patent anterior and middle cerebral arteries. No large vessel occlusion, significant stenosis, contrast extravasation or aneurysm. POSTERIOR CIRCULATION: Patent vertebral arteries, vertebrobasilar junction and basilar artery, as well as main branch vessels. Moderate luminal irregularity bilateral vertebral arteries compatible with atherosclerosis. Patent posterior cerebral arteries. No large vessel occlusion, significant stenosis, contrast extravasation or aneurysm. VENOUS SINUSES: Major dural venous sinuses are patent though not tailored for evaluation on this angiographic examination. ANATOMIC VARIANTS: None. DELAYED PHASE:  No abnormal intracranial enhancement. MIP images reviewed. IMPRESSION: CTA NECK: 1. No hemodynamically significant stenosis of the carotid arteries. 2. Severe stenosis RIGHT vertebral artery origin. Patent bilateral vertebral arteries. 3. Severe canal stenosis C6-7. 4. Moderate RIGHT small LEFT pleural effusions. CTA HEAD: 1. No emergent large vessel occlusion or flow-limiting stenosis. 2. Moderate vertebral artery atherosclerosis. Aortic Atherosclerosis (ICD10-I70.0). Electronically Signed   By: Elon Alas M.D.   On: 06/03/2018 17:35   Ct Head Wo Contrast  Result Date: 06/03/2018 CLINICAL DATA:  Mental status change. EXAM: CT HEAD WITHOUT CONTRAST TECHNIQUE: Contiguous axial images were obtained from the base of the skull through the vertex without intravenous contrast. COMPARISON:  10/23/2017 FINDINGS: Brain: Stable age related cerebral atrophy, ventriculomegaly and periventricular white matter disease. No extra-axial fluid collections are identified. No CT findings for acute hemispheric infarction or intracranial hemorrhage. Probable remote high right posterior parietal white matter infarct, unchanged. No mass lesions. The brainstem and cerebellum are normal. Vascular: No worrisome vascular calcifications or obvious aneurysm. Skull: No skull fracture or bone lesion. Sinuses/Orbits: Mucoperiosteal thickening involving the left maxillary sinus. The other paranasal sinuses are clear. Other: No scalp lesions or hematoma. IMPRESSION: 1. Advanced cerebral atrophy and patchy deep periventricular white matter disease, unchanged. 2. Encephalomalacia in the high right parietal area posteriorly possibly due to remote white matter infarct. 3. No acute intracranial findings, skull fracture or mass lesions. Electronically Signed   By: Marijo Sanes M.D.   On: 06/03/2018 15:28   Ct Angio Neck W And/or Wo Contrast  Result Date: 06/03/2018 CLINICAL DATA:  Neck pain radiating to back, unable to move, incontinent.  Follow-up suspected stroke. History of stroke, hypertension, hyperlipidemia, Parkinson's disease/dementia. EXAM: CT ANGIOGRAPHY HEAD AND NECK TECHNIQUE: Multidetector CT imaging of the head and neck was performed using the standard protocol during bolus administration of intravenous contrast. Multiplanar CT image reconstructions and MIPs were obtained to evaluate the vascular anatomy. Carotid stenosis measurements (when applicable) are obtained utilizing NASCET criteria, using the distal internal carotid diameter as the denominator. CONTRAST:  136mL ISOVUE-370 IOPAMIDOL (ISOVUE-370) INJECTION 76% COMPARISON:  CT HEAD June 03, 2018 FINDINGS: CTA NECK FINDINGS: AORTIC ARCH: Normal appearance of the thoracic arch, normal branch pattern. The  origins of the innominate, left Common carotid artery and subclavian artery are widely patent. RIGHT CAROTID SYSTEM: Common carotid artery is patent. Mild calcific atherosclerosis of the carotid bifurcation without hemodynamically significant stenosis by NASCET criteria. Internal carotid artery with mild luminal irregularity most compatible with atherosclerosis. LEFT CAROTID SYSTEM: Common carotid artery is patent. Trace atherosclerosis of the carotid bifurcation without hemodynamically significant stenosis by NASCET criteria. Normal appearance of the internal carotid artery. VERTEBRAL ARTERIES:Severe stenosis RIGHT vertebral artery origin. LEFT vertebral artery is dominant. Mild extrinsic deformity of the vertebral bodies due to degenerative cervical spine. SKELETON: No acute osseous process though bone windows have not been submitted. Degenerative cervical spine resulting in severe canal stenosis C6-7. OTHER NECK: Soft tissues of the neck are nonacute though, not tailored for evaluation. UPPER CHEST: Partially imaged moderate RIGHT and small LEFT pleural effusions. LEFT cardiac pacemaker. CTA HEAD FINDINGS: ANTERIOR CIRCULATION: Patent cervical internal carotid arteries, petrous,  cavernous and supra clinoid internal carotid arteries. Patent anterior communicating artery. Patent anterior and middle cerebral arteries. No large vessel occlusion, significant stenosis, contrast extravasation or aneurysm. POSTERIOR CIRCULATION: Patent vertebral arteries, vertebrobasilar junction and basilar artery, as well as main branch vessels. Moderate luminal irregularity bilateral vertebral arteries compatible with atherosclerosis. Patent posterior cerebral arteries. No large vessel occlusion, significant stenosis, contrast extravasation or aneurysm. VENOUS SINUSES: Major dural venous sinuses are patent though not tailored for evaluation on this angiographic examination. ANATOMIC VARIANTS: None. DELAYED PHASE: No abnormal intracranial enhancement. MIP images reviewed. IMPRESSION: CTA NECK: 1. No hemodynamically significant stenosis of the carotid arteries. 2. Severe stenosis RIGHT vertebral artery origin. Patent bilateral vertebral arteries. 3. Severe canal stenosis C6-7. 4. Moderate RIGHT small LEFT pleural effusions. CTA HEAD: 1. No emergent large vessel occlusion or flow-limiting stenosis. 2. Moderate vertebral artery atherosclerosis. Aortic Atherosclerosis (ICD10-I70.0). Electronically Signed   By: Elon Alas M.D.   On: 06/03/2018 17:35   Dg Chest Portable 1 View  Result Date: 06/03/2018 CLINICAL DATA:  Generalized weakness EXAM: PORTABLE CHEST 1 VIEW COMPARISON:  03/06/2018 FINDINGS: Cardiac shadow is enlarged. Pacing device is again seen. The lungs are well aerated without focal infiltrate or sizable effusion. Mild chronic interstitial changes are again seen and stable. No acute bony abnormality is noted. IMPRESSION: Chronic interstitial changes without acute abnormality. Electronically Signed   By: Inez Catalina M.D.   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  1.  Right-sided weakness: This is concerning for acute CVA versus spinal cord injury. On examination patient is completely flaccid Neurology consultation pending CTA is concerning for Severe stenosis RIGHT vertebral artery origin. Vascular surgery consultation requested PT consultation requested  Continue neurochecks Continue Eliquis, aspirin and statin  2.  PAF: Continue Eliquis and metoprolol  3.  Elevated troponin: This is likely due to to demand ischemia with flat troponins Cardiology consultation requested  4.  History of CVA with left-sided hemiparesis: Continue Eliquis, aspirin, statin  5.  Parkinson's disease: Continue Sinemet  6.  Depression: Continue Cymbalta  7.  BPH: Continue Proscar And Hytrin 8.  Chronic diastolic and systolic heart failure without signs of exacerbation: Continue torsemide   Management plans discussed with the patient and he is in agreement.  CODE STATUS: limitwed  TOTAL TIME TAKING CARE OF  THIS PATIENT: 30 minutes.     POSSIBLE D/C 2-3 days, DEPENDING ON CLINICAL CONDITION.   Zamaya Rapaport M.D on 06/04/2018 at 12:05 PM  Between 7am to 6pm - Pager - [phone] After 6pm go to [email or web address] - password [email or web address]  Office  [phone]  CC: Primary care physician; System, Pcp Not In  Note: This dictation was prepared with Dragon dictation along with smaller phrase technology. Any transcriptional errors that result from this process are unintentional.

## 2018-06-04 NOTE — Evaluation (Signed)
Physical Therapy Evaluation Patient Details Name: Danny Grimes MRN: [id number] DOB: 03-Mar-1928 Today's Date: 06/04/2018   History of Present Illness  presented to ER secondary to episode of decreased responsiveness, worsening R-sided weakness; admitted for TIA/CVA work-up.  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 MRI unable due to PPM  Clinical Impression  Upon evaluation, patient alert and oriented to self, general situation; intermittent confusion noted throughout session.  Profound weakness (near complete flaccidity) throughout all extremities with decreased sensory awareness, UEs > LEs, distal > proximal.  Generally hypotonic throughout; negative clonus, negative babinski noted in LEs.  Anticipate dep/total care for all ADLs, mobility tasks due to limited active use/movement of extremities.  Upright/OOB mobility attempts deferred pending full neurology assessment and plan (presentation/symptoms consistent with possible SCI involvement).  Will continue to assess/progress mobility as medically appropriate. Would benefit from skilled PT to address above deficits and promote optimal return to PLOF; recommend transition to STR upon discharge from acute hospitalization.     Follow Up Recommendations SNF    Equipment Recommendations       Recommendations for Other Services       Precautions / Restrictions Precautions Precautions: Fall Restrictions Weight Bearing Restrictions: No      Mobility  Bed Mobility               General bed mobility comments: deferred pending neurology assessment and additional work up; anticipate dep assist required due to flaccid extremities  Transfers                 General transfer comment: deferred pending neurology assessment and additional work up; anticipate dep assist required due to flaccid extremities  Ambulation/Gait              General Gait Details: deferred pending neurology assessment and additional work up; anticipate dep assist required due to flaccid extremities  Stairs            Wheelchair Mobility    Modified Rankin (Stroke Patients Only)       Balance                                             Pertinent Vitals/Pain Pain Assessment: Faces Faces Pain Scale: Hurts little more Pain Location: back Pain Descriptors / Indicators: Aching;Grimacing;Guarding Pain Intervention(s): Limited activity within patient's tolerance;Monitored during session;Repositioned    Home Living Family/patient expects to be discharged to:: Assisted living               Home Equipment: Walker - 2 wheels;Wheelchair - manual Additional Comments: Resident of [facility]; ambulatory with RW per patient report (has some baseline weakness in L UE, but able to grasp/manage RW)    Prior Function Level of Independence: Needs assistance         Comments: Per previous documentation, ambulatory with RW in room/facility; ambulates to/from dining hall for meals (~40 yards away); assist from staff for ADLs as needed. Denies fall history or traumatic injury.     Hand Dominance        Extremity/Trunk Assessment   Upper Extremity Assessment Upper Extremity Assessment: (trace biceps activation in R UE; otherwise, generally flaccid without active movement/control.  Partial subluxation noted to R shoulder GH complex.  Impaired sensation bilat, distally > proximally.  Globally hypotonic.)  Lower Extremity Assessment Lower Extremity Assessment: (trace movement L ankle DF; otherwise, generally flaccid.  Negative clonus, negative babinski.  Questionable sensation-inconsistent responses (though appears better than UE sensation).  Globally hypotonic.)       Communication   Communication: HOH  Cognition Arousal/Alertness: Awake/alert Behavior During Therapy: WFL for tasks assessed/performed                                    General Comments: oriented to self only, generally aware of situation; unaware of date and location      General Comments      Exercises     Assessment/Plan    PT Assessment Patient needs continued PT services  PT Problem List Decreased strength;Decreased range of motion;Decreased activity tolerance;Decreased balance;Decreased mobility;Decreased coordination;Decreased cognition;Decreased knowledge of use of DME;Decreased safety awareness;Decreased knowledge of precautions;Impaired sensation;Pain;Impaired tone       PT Treatment Interventions DME instruction;Gait training;Functional mobility training;Therapeutic activities;Therapeutic exercise;Balance training;Neuromuscular re-education;Cognitive remediation;Patient/family education    PT Goals (Current goals can be found in the Care Plan section)  Acute Rehab PT Goals Patient Stated Goal: to try PT Goal Formulation: With patient Time For Goal Achievement: 06/18/18 Potential to Achieve Goals: Fair Additional Goals Additional Goal #1: Assess and establish goals for transfers and OOB/mobility as appropriate.    Frequency Min 2X/week   Barriers to discharge Decreased caregiver support      Co-evaluation               AM-PAC PT "6 Clicks" Daily Activity  Outcome Measure Difficulty turning over in bed (including adjusting bedclothes, sheets and blankets)?: Unable Difficulty moving from lying on back to sitting on the side of the bed? : Unable Difficulty sitting down on and standing up from a chair with arms (e.g., wheelchair, bedside commode, etc,.)?: Unable Help needed moving to and from a bed to chair (including a wheelchair)?: Total Help needed walking in hospital room?: Total Help needed climbing 3-5 steps with a railing? : Total 6 Click Score: 6    End of Session   Activity Tolerance: Treatment limited secondary to medical complications (Comment)(pending neurology  assessment, additional work up?) Patient left: in bed;with call bell/phone within reach;with bed alarm set;with nursing/sitter in room Nurse Communication: Mobility status PT Visit Diagnosis: Muscle weakness (generalized) (M62.81);Difficulty in walking, not elsewhere classified (R26.2);Other symptoms and signs involving the nervous system (R29.898)    Time: 8182-9937 PT Time Calculation (min) (ACUTE ONLY): 28 min   Charges:   PT Evaluation $PT Eval High Complexity: 1 High PT Treatments $Therapeutic Activity: 8-22 mins   PT G Codes:        Evaluation/treatment session was lead and completed by Roxan Hockey, SPT; directly observed, guided and documented by Tera Helper, PT.  Reyes Ivan. Owens Shark, PT, DPT, NCS 06/04/18, 12:57 PM [phone]

## 2018-06-04 NOTE — Consult Note (Signed)
[facility] Vascular Consult Note  MRN : [id number]  Danny Grimes is a 84 y.o. (1928/11/27) male who presents with chief complaint of  Chief Complaint  Patient presents with  . Altered Mental Status  .  History of Present Illness: I am asked by Dr. Benjie Karvonen to see the patient for vertebral artery stenosis.  The patient has a long-standing left hemiparesis from previous cerebral infarction.  He presents with several days of right sided hemiparesis now being basically quadriparetic.  There was no clear inciting event or causative factor that started his symptoms.  He is very debilitated and lives at a nursing home at his baseline.  He reports no trauma or injury.  The quadriparesis persists.  He has undergone a CT scan which did not show any obvious acute infarct but an old infarct was seen.  He has also undergone a CT angiogram of the head and neck.  I have independently reviewed his CT angiogram.  His carotid arteries are reasonably clear with only very mild disease.  His left vertebral artery is dominant.  There is no significant stenosis in his vertebral artery on the left.  He does appear to have a proximal right vertebral artery stenosis, and this is clearly the smaller of the 2 vessels.   Current Facility-Administered Medications  Medication Dose Route Frequency Provider Last Rate Last Dose  . acetaminophen (TYLENOL) tablet 650 mg  650 mg Oral Q6H PRN Harrie Foreman, MD       Or  . acetaminophen (TYLENOL) suppository 650 mg  650 mg Rectal Q6H PRN Harrie Foreman, MD      . apixaban Arne [place]) tablet 5 mg  5 mg Oral Q12H Harrie Foreman, MD   5 mg at 06/04/18 1026  . atorvastatin (LIPITOR) tablet 40 mg  40 mg Oral QHS Harrie Foreman, MD      . calcium-vitamin D (OSCAL WITH D) 500-200 MG-UNIT per tablet 2 tablet  2 tablet Oral Q breakfast Harrie Foreman, MD      . carbidopa-levodopa (SINEMET IR) 25-100 MG per tablet immediate release 1 tablet  1 tablet  Oral TID Alexis Goodell, MD      . cholecalciferol (VITAMIN D) tablet 1,000 Units  1,000 Units Oral Daily Harrie Foreman, MD   1,000 Units at 06/04/18 1026  . docusate sodium (COLACE) capsule 100 mg  100 mg Oral BID Harrie Foreman, MD   100 mg at 06/04/18 1025  . DULoxetine (CYMBALTA) DR capsule 90 mg  90 mg Oral Daily Harrie Foreman, MD   90 mg at 06/04/18 1024  . finasteride (PROSCAR) tablet 5 mg  5 mg Oral Daily Harrie Foreman, MD   5 mg at 06/04/18 1026  . fluticasone (FLONASE) 50 MCG/ACT nasal spray 1 spray  1 spray Each Nare Daily Harrie Foreman, MD   1 spray at 06/04/18 0911  . folic acid (FOLVITE) tablet 1 mg  1 mg Oral Daily Harrie Foreman, MD   1 mg at 06/04/18 1026  . loratadine (CLARITIN) tablet 10 mg  10 mg Oral Daily PRN Harrie Foreman, MD      . MEDLINE mouth rinse  15 mL Mouth Rinse BID Harrie Foreman, MD   15 mL at 06/04/18 0908  . Melatonin TABS 5 mg  5 mg Oral QHS Harrie Foreman, MD      . metoprolol succinate (TOPROL-XL) 24 hr tablet 25 mg  25 mg Oral Daily  Harrie Foreman, MD   25 mg at 06/04/18 1024  . miconazole (MICOTIN) 2 % cream 1 application  1 application Topical BID PRN Harrie Foreman, MD      . OLANZapine [facility]) tablet 2.5 mg  2.5 mg Oral QHS Harrie Foreman, MD      . omega-3 acid ethyl esters (LOVAZA) capsule 1 g  1 g Oral BID Harrie Foreman, MD      . ondansetron [facility]) tablet 4 mg  4 mg Oral Q6H PRN Harrie Foreman, MD       Or  . ondansetron [facility]) injection 4 mg  4 mg Intravenous Q6H PRN Harrie Foreman, MD      . pantoprazole (PROTONIX) EC tablet 40 mg  40 mg Oral BID Harrie Foreman, MD   40 mg at 06/04/18 1026  . polyvinyl alcohol (LIQUIFILM TEARS) 1.4 % ophthalmic solution 1 drop  1 drop Both Eyes TID PRN Harrie Foreman, MD      . psyllium (HYDROCIL/METAMUCIL) packet 1 packet  1 packet Oral Daily Harrie Foreman, MD   1 packet at 06/04/18 1031  . senna-docusate (Senokot-S) tablet 1  tablet  1 tablet Oral BID Harrie Foreman, MD   1 tablet at 06/04/18 1026  . terazosin (HYTRIN) capsule 5 mg  5 mg Oral QHS Harrie Foreman, MD      . torsemide [facility]) tablet 10 mg  10 mg Oral Daily Harrie Foreman, MD   10 mg at 06/04/18 1025  . trolamine salicylate (ASPERCREME) 10 % cream   Topical TID PRN Harrie Foreman, MD      . vitamin B-12 (CYANOCOBALAMIN) tablet 1,000 mcg  1,000 mcg Oral Daily Harrie Foreman, MD   1,000 mcg at 06/04/18 1026    Past Medical History:  Diagnosis Date  . Adenocarcinoma ([place])   . Anxiety   . Bilateral renal masses   . CHF (congestive heart failure) ([place])   . Colon polyp   . Degenerative joint disease   . Depression   . DNR (do not resuscitate)   . Fitting and adjustment of cardiac pacemaker   . Hemiparesis affecting left side as late effect of cerebrovascular accident (CVA) ([place])   . Hyperlipemia   . Hypertension   . Parkinson disease ([place])   . Paroxysmal atrial fibrillation (H[facility])   . Peripheral arterial disease ([place])   . Skin cancer     Past Surgical History:  Procedure Laterality Date  . insert / replace/ remove peacemaker      Social History Social History   Tobacco Use  . Smoking status: Former Research scientist (life sciences)  . Smokeless tobacco: Never Used  Substance Use Topics  . Alcohol use: No  . Drug use: No  Lives in a nursing facility  Family History Mother had hypertension and a stroke.  Father had coronary disease.  Brother had coronary disease and prostate cancer.  No known clotting or bleeding disorders.  No Known Allergies   REVIEW OF SYSTEMS (Negative unless checked)  Constitutional: [] Weight loss  [] Fever  [] Chills Cardiac: [] Chest pain   [] Chest pressure   [x] Palpitations   [] Shortness of breath when laying flat   [] Shortness of breath at rest   [] Shortness of breath with exertion. Vascular:  [] Pain in legs with walking   [] Pain in legs at rest   [] Pain in legs when laying flat   [] Claudication   [] Pain in feet  when walking  [] Pain in feet at  rest  [] Pain in feet when laying flat   [] History of DVT   [] Phlebitis   [] Swelling in legs   [] Varicose veins   [] Non-healing ulcers Pulmonary:   [] Uses home oxygen   [] Productive cough   [] Hemoptysis   [] Wheeze  [] COPD   [] Asthma Neurologic:  [] Dizziness  [] Blackouts   [] Seizures   [x] History of stroke   [] History of TIA  [] Aphasia   [] Temporary blindness   [] Dysphagia   [x] Weakness or numbness in arms   [x] Weakness or numbness in legs Musculoskeletal:  [x] Arthritis   [] Joint swelling   [x] Joint pain   [x] Low back pain Hematologic:  [] Easy bruising  [] Easy bleeding   [] Hypercoagulable state   [x] Anemic  [] Hepatitis Gastrointestinal:  [] Blood in stool   [] Vomiting blood  [] Gastroesophageal reflux/heartburn   [] Difficulty swallowing. Genitourinary:  [] Chronic kidney disease   [] Difficult urination  [] Frequent urination  [] Burning with urination   [] Blood in urine Skin:  [] Rashes   [] Ulcers   [] Wounds Psychological:  [x] History of anxiety   [x]  History of major depression.  Physical Examination  Vitals:   06/04/18 0758 06/04/18 0800 06/04/18 0958 06/04/18 1358  BP: (!) 142/78  (!) 144/67 (!) 142/80  Pulse: 87  86 92  Resp: 18  18 20   Temp: 98.4 F (36.9 C)  (!) 97.4 F (36.3 C) (!) 97.5 F (36.4 C)  TempSrc: Oral Oral Oral Oral  SpO2: 96%  95% 94%  Weight:      Height:       Body mass index is 27.57 kg/m. Gen: Debilitated and frail elderly male. Head: [place]/AT, No temporalis wasting.  Ear/Nose/Throat: Hearing grossly intact, nares w/o erythema or drainage, oropharynx w/o Erythema/Exudate Eyes: Sclera non-icteric, conjunctiva clear Neck: Trachea midline.  No bruits Pulmonary:  Good air movement, respirations not labored, equal bilaterally.  Cardiac: Irregularly irregular.  No JVD Vascular:  Vessel Right Left  Radial Palpable Palpable                          PT  1+ palpable  not palpable  DP  1+ palpable  1+ palpable   Gastrointestinal: soft,  non-tender/non-distended. Musculoskeletal: Essentially quadriparetic with minimal motion in the arms or legs bilaterally.  Extremities without ischemic changes. No edema. Neurologic: Sensation reduced in extremities.  Speech is a little difficult to discern. Motor exam as listed above. Psychiatric: Judgment and insight appear to be at least fair. Dermatologic: No rashes or ulcers noted.  No cellulitis or open wounds.       CBC Lab Results  Component Value Date   WBC 7.6 06/03/2018   HGB 9.0 (L) 06/03/2018   HCT 28.6 (L) 06/03/2018   MCV 73.9 (L) 06/03/2018   PLT 275 06/03/2018    BMET    Component Value Date/Time   NA 140 06/03/2018 1443   K 3.7 06/03/2018 1443   CL 101 06/03/2018 1443   CO2 28 06/03/2018 1443   GLUCOSE 119 (H) 06/03/2018 1443   BUN 31 (H) 06/03/2018 1443   CREATININE 1.04 06/03/2018 1443   CALCIUM 9.3 06/03/2018 1443   GFRNONAA >60 06/03/2018 1443   GFRAA >60 06/03/2018 1443   Estimated Creatinine Clearance: 52.3 mL/min (by C-G formula based on SCr of 1.04 mg/dL).  COAG Lab Results  Component Value Date   INR 1.90 06/03/2018   INR 1.24 03/06/2018   INR 0.99 07/22/2016    Radiology Ct Angio Head W Or Wo Contrast  Result Date: 06/03/2018 CLINICAL  DATA:  Neck pain radiating to back, unable to move, incontinent. Follow-up suspected stroke. History of stroke, hypertension, hyperlipidemia, Parkinson's disease/dementia. EXAM: CT ANGIOGRAPHY HEAD AND NECK TECHNIQUE: Multidetector CT imaging of the head and neck was performed using the standard protocol during bolus administration of intravenous contrast. Multiplanar CT image reconstructions and MIPs were obtained to evaluate the vascular anatomy. Carotid stenosis measurements (when applicable) are obtained utilizing NASCET criteria, using the distal internal carotid diameter as the denominator. CONTRAST:  14mL ISOVUE-370 IOPAMIDOL (ISOVUE-370) INJECTION 76% COMPARISON:  CT HEAD June 03, 2018 FINDINGS: CTA  NECK FINDINGS: AORTIC ARCH: Normal appearance of the thoracic arch, normal branch pattern. The origins of the innominate, left Common carotid artery and subclavian artery are widely patent. RIGHT CAROTID SYSTEM: Common carotid artery is patent. Mild calcific atherosclerosis of the carotid bifurcation without hemodynamically significant stenosis by NASCET criteria. Internal carotid artery with mild luminal irregularity most compatible with atherosclerosis. LEFT CAROTID SYSTEM: Common carotid artery is patent. Trace atherosclerosis of the carotid bifurcation without hemodynamically significant stenosis by NASCET criteria. Normal appearance of the internal carotid artery. VERTEBRAL ARTERIES:Severe stenosis RIGHT vertebral artery origin. LEFT vertebral artery is dominant. Mild extrinsic deformity of the vertebral bodies due to degenerative cervical spine. SKELETON: No acute osseous process though bone windows have not been submitted. Degenerative cervical spine resulting in severe canal stenosis C6-7. OTHER NECK: Soft tissues of the neck are nonacute though, not tailored for evaluation. UPPER CHEST: Partially imaged moderate RIGHT and small LEFT pleural effusions. LEFT cardiac pacemaker. CTA HEAD FINDINGS: ANTERIOR CIRCULATION: Patent cervical internal carotid arteries, petrous, cavernous and supra clinoid internal carotid arteries. Patent anterior communicating artery. Patent anterior and middle cerebral arteries. No large vessel occlusion, significant stenosis, contrast extravasation or aneurysm. POSTERIOR CIRCULATION: Patent vertebral arteries, vertebrobasilar junction and basilar artery, as well as main branch vessels. Moderate luminal irregularity bilateral vertebral arteries compatible with atherosclerosis. Patent posterior cerebral arteries. No large vessel occlusion, significant stenosis, contrast extravasation or aneurysm. VENOUS SINUSES: Major dural venous sinuses are patent though not tailored for evaluation  on this angiographic examination. ANATOMIC VARIANTS: None. DELAYED PHASE: No abnormal intracranial enhancement. MIP images reviewed. IMPRESSION: CTA NECK: 1. No hemodynamically significant stenosis of the carotid arteries. 2. Severe stenosis RIGHT vertebral artery origin. Patent bilateral vertebral arteries. 3. Severe canal stenosis C6-7. 4. Moderate RIGHT small LEFT pleural effusions. CTA HEAD: 1. No emergent large vessel occlusion or flow-limiting stenosis. 2. Moderate vertebral artery atherosclerosis. Aortic Atherosclerosis (ICD10-I70.0). Electronically Signed   By: Elon Alas M.D.   On: 06/03/2018 17:35   Ct Head Wo Contrast  Result Date: 06/03/2018 CLINICAL DATA:  Mental status change. EXAM: CT HEAD WITHOUT CONTRAST TECHNIQUE: Contiguous axial images were obtained from the base of the skull through the vertex without intravenous contrast. COMPARISON:  10/23/2017 FINDINGS: Brain: Stable age related cerebral atrophy, ventriculomegaly and periventricular white matter disease. No extra-axial fluid collections are identified. No CT findings for acute hemispheric infarction or intracranial hemorrhage. Probable remote high right posterior parietal white matter infarct, unchanged. No mass lesions. The brainstem and cerebellum are normal. Vascular: No worrisome vascular calcifications or obvious aneurysm. Skull: No skull fracture or bone lesion. Sinuses/Orbits: Mucoperiosteal thickening involving the left maxillary sinus. The other paranasal sinuses are clear. Other: No scalp lesions or hematoma. IMPRESSION: 1. Advanced cerebral atrophy and patchy deep periventricular white matter disease, unchanged. 2. Encephalomalacia in the high right parietal area posteriorly possibly due to remote white matter infarct. 3. No acute intracranial findings, skull fracture or mass lesions. Electronically  Signed   By: Marijo Sanes M.D.   On: 06/03/2018 15:28   Ct Angio Neck W And/or Wo Contrast  Result Date:  06/03/2018 CLINICAL DATA:  Neck pain radiating to back, unable to move, incontinent. Follow-up suspected stroke. History of stroke, hypertension, hyperlipidemia, Parkinson's disease/dementia. EXAM: CT ANGIOGRAPHY HEAD AND NECK TECHNIQUE: Multidetector CT imaging of the head and neck was performed using the standard protocol during bolus administration of intravenous contrast. Multiplanar CT image reconstructions and MIPs were obtained to evaluate the vascular anatomy. Carotid stenosis measurements (when applicable) are obtained utilizing NASCET criteria, using the distal internal carotid diameter as the denominator. CONTRAST:  133mL ISOVUE-370 IOPAMIDOL (ISOVUE-370) INJECTION 76% COMPARISON:  CT HEAD June 03, 2018 FINDINGS: CTA NECK FINDINGS: AORTIC ARCH: Normal appearance of the thoracic arch, normal branch pattern. The origins of the innominate, left Common carotid artery and subclavian artery are widely patent. RIGHT CAROTID SYSTEM: Common carotid artery is patent. Mild calcific atherosclerosis of the carotid bifurcation without hemodynamically significant stenosis by NASCET criteria. Internal carotid artery with mild luminal irregularity most compatible with atherosclerosis. LEFT CAROTID SYSTEM: Common carotid artery is patent. Trace atherosclerosis of the carotid bifurcation without hemodynamically significant stenosis by NASCET criteria. Normal appearance of the internal carotid artery. VERTEBRAL ARTERIES:Severe stenosis RIGHT vertebral artery origin. LEFT vertebral artery is dominant. Mild extrinsic deformity of the vertebral bodies due to degenerative cervical spine. SKELETON: No acute osseous process though bone windows have not been submitted. Degenerative cervical spine resulting in severe canal stenosis C6-7. OTHER NECK: Soft tissues of the neck are nonacute though, not tailored for evaluation. UPPER CHEST: Partially imaged moderate RIGHT and small LEFT pleural effusions. LEFT cardiac pacemaker. CTA HEAD  FINDINGS: ANTERIOR CIRCULATION: Patent cervical internal carotid arteries, petrous, cavernous and supra clinoid internal carotid arteries. Patent anterior communicating artery. Patent anterior and middle cerebral arteries. No large vessel occlusion, significant stenosis, contrast extravasation or aneurysm. POSTERIOR CIRCULATION: Patent vertebral arteries, vertebrobasilar junction and basilar artery, as well as main branch vessels. Moderate luminal irregularity bilateral vertebral arteries compatible with atherosclerosis. Patent posterior cerebral arteries. No large vessel occlusion, significant stenosis, contrast extravasation or aneurysm. VENOUS SINUSES: Major dural venous sinuses are patent though not tailored for evaluation on this angiographic examination. ANATOMIC VARIANTS: None. DELAYED PHASE: No abnormal intracranial enhancement. MIP images reviewed. IMPRESSION: CTA NECK: 1. No hemodynamically significant stenosis of the carotid arteries. 2. Severe stenosis RIGHT vertebral artery origin. Patent bilateral vertebral arteries. 3. Severe canal stenosis C6-7. 4. Moderate RIGHT small LEFT pleural effusions. CTA HEAD: 1. No emergent large vessel occlusion or flow-limiting stenosis. 2. Moderate vertebral artery atherosclerosis. Aortic Atherosclerosis (ICD10-I70.0). Electronically Signed   By: Elon Alas M.D.   On: 06/03/2018 17:35   Dg Chest Portable 1 View  Result Date: 06/03/2018 CLINICAL DATA:  Generalized weakness EXAM: PORTABLE CHEST 1 VIEW COMPARISON:  03/06/2018 FINDINGS: Cardiac shadow is enlarged. Pacing device is again seen. The lungs are well aerated without focal infiltrate or sizable effusion. Mild chronic interstitial changes are again seen and stable. No acute bony abnormality is noted. IMPRESSION: Chronic interstitial changes without acute abnormality. Electronically Signed   By: Inez Catalina M.D.   On: 06/03/2018 15:58      Assessment/Plan 1.  Quadriparesis.  Etiology not entirely  clear but suspicion for spinal stenosis and neurosurgery to evaluate.  This is not secondary to vascular disease and his unilateral vertebral artery stenosis with the smaller vessel is not the cause of this finding. 2.  Right vertebral artery stenosis.  I  have independently reviewed his CT scan.  His carotid arteries are reasonably clear with only very mild disease.  His left vertebral artery is dominant.  He does appear to have a proximal right vertebral artery stenosis, and this is clearly the smaller of the 2 vessels.  No intervention would be recommended or considered at this time.  This is likely asymptomatic and has likely been present for some period of time.  Consideration for annual follow-up for his carotid and vertebral disease as an outpatient can be given although with his advanced age and debilitated status, he would be a poor candidate for any revascularization in the future. 3.  Cardiac arrhythmias.  Status post pacemaker placement.  Cannot get an MRI at this time.  On anticoagulation 4.  Peripheral arterial disease.  No apparent limb threatening symptoms at this time 5.  Hypertension.  Stable on outpatient medications and blood pressure control important in reducing the progression of atherosclerotic disease. On appropriate oral medications.    Leotis Pain, MD  06/04/2018 2:38 PM    This note was created with Dragon medical transcription system.  Any error is purely unintentional

## 2018-06-04 NOTE — Consult Note (Signed)
Referring Physician: Mody    Chief Complaint: Weakness  HPI: Danny Grimes is an 84 y.o. male with a history of PD and stroke with residual left hemiparesis who presents quadriparetic.  The patient reports that he has had intermittent problems with his right side for some time now.  Chart reveals that is a SNF resident and had been less responsive and functional for two days prior to presentation.  At baseline able to transfer from bed to wheelchair.   Initial NIHSS of 9.    Date last known well: Unable to determine Time last known well: Unable to determine tPA Given: No: Unable to determine LKW  Past Medical History:  Diagnosis Date  . Adenocarcinoma ([place])   . Anxiety   . Bilateral renal masses   . CHF (congestive heart failure) ([place])   . Colon polyp   . Degenerative joint disease   . Depression   . DNR (do not resuscitate)   . Fitting and adjustment of cardiac pacemaker   . Hemiparesis affecting left side as late effect of cerebrovascular accident (CVA) ([place])   . Hyperlipemia   . Hypertension   . Parkinson disease ([place])   . Paroxysmal atrial fibrillation ([facility])   . Peripheral arterial disease ([place])   . Skin cancer     Past Surgical History:  Procedure Laterality Date  . insert / replace/ remove peacemaker      Family history: Mother with HTN and stroke.  Father with CAD and MI.  Brother with CAD and prostate cancer.  Social History:  reports that he has quit smoking. He has never used smokeless tobacco. He reports that he does not drink alcohol or use drugs.  Allergies: No Known Allergies  Medications:  I have reviewed the patient's current medications. Prior to Admission:  Medications Prior to Admission  Medication Sig Dispense Refill Last Dose  . acetaminophen (TYLENOL) 325 MG tablet Take 650 mg by mouth 3 (three) times daily.    unknown at unknown  . apixaban (ELIQUIS) 5 MG TABS tablet Take 5 mg by mouth every 12 (twelve) hours.    unknown at unknown  . aspirin 81  MG chewable tablet Chew 81 mg by mouth daily.   unknown at unknown  . atorvastatin (LIPITOR) 80 MG tablet Take 40 mg by mouth at bedtime.   unknown at unknown  . Calcium Carbonate-Vitamin D 600-400 MG-UNIT tablet Take 2 tablets by mouth daily with breakfast.   unknown at unknown  . carbidopa-levodopa (SINEMET IR) 25-100 MG tablet Take 2 tablets by mouth 3 (three) times daily.    unknown at unknown  . carboxymethylcellulose (REFRESH TEARS) 0.5 % SOLN Place 1 drop into both eyes 3 (three) times daily as needed (for dry eyes).   PRN at PRN  . Cholecalciferol 10000 units TABS Take 1,000 Units by mouth daily.    unknown at unknown  . DULoxetine (CYMBALTA) 30 MG capsule Take 90 mg by mouth daily.   unknown at unknown  . finasteride (PROSCAR) 5 MG tablet Take 5 mg by mouth daily.    unknown at unknown  . fluticasone (FLONASE) 50 MCG/ACT nasal spray Place 1 spray into both nostrils daily.   unknown at unknown  . folic acid (FOLVITE) 1 MG tablet Take 1 mg by mouth daily.   unknown at unknown  . ibuprofen (ADVIL,MOTRIN) 400 MG tablet Take 400 mg by mouth as needed for mild pain or moderate pain.   PRN at PRN  . loratadine (CLARITIN) 10 MG tablet  Take 10 mg by mouth daily as needed for allergies.   PRN at PRN  . Melatonin 3 MG TABS Take 6 mg by mouth at bedtime.    unknown at unknown  . Menthol-Methyl Salicylate (THERA-GESIC EX) Apply topically 3 (three) times daily as needed (for pain on buttocks, hips, and ankle).   PRN at PRN  . metoprolol succinate (TOPROL-XL) 50 MG 24 hr tablet Take 25 mg by mouth daily. Take with or immediately following a meal.   unknown at unknown  . miconazole (MICOTIN) 2 % cream Apply 1 application topically 2 (two) times daily as needed (for fungal infection).   PRN at PRN  . OLANZapine (ZYPREXA) 2.5 MG tablet Take 2.5 mg by mouth at bedtime.   unknown at unknown  . omega-3 acid ethyl esters (LOVAZA) 1 g capsule Take 1 g by mouth 2 (two) times daily.   unknown at unknown  .  pantoprazole (PROTONIX) 40 MG tablet Take 40 mg by mouth 2 (two) times daily.   unknown at unknown  . Petrolatum-Zinc Oxide (SENSI-CARE PROTECTIVE BARRIER EX) Apply 1 application topically 2 (two) times daily.   unknown at unknown  . psyllium (HYDROCIL/METAMUCIL) 95 % PACK Take 1 packet by mouth daily.   unknown at unknown  . Salicylic Acid 6 % SHAM Apply topically every other day.   unknown at unknown  . senna-docusate (SENOKOT-S) 8.6-50 MG tablet Take 1 tablet by mouth 2 (two) times daily.   unknown at unknown  . terazosin (HYTRIN) 5 MG capsule Take 5 mg by mouth at bedtime.   unknown at unknown  . torsemide (DEMADEX) 10 MG tablet Take 10 mg by mouth daily.   unknown at unknown  . vitamin B-12 (CYANOCOBALAMIN) 1000 MCG tablet Take 1,000 mcg by mouth daily.   unknown at unknown  . furosemide (LASIX) 20 MG tablet Take 1 tablet (20 mg total) by mouth daily. (Patient not taking: Reported on 03/06/2018) 30 tablet 0 Not Taking at Unknown time   Scheduled: . apixaban  5 mg Oral Q12H  . atorvastatin  40 mg Oral QHS  . calcium-vitamin D  2 tablet Oral Q breakfast  . carbidopa-levodopa  2 tablet Oral TID  . cholecalciferol  1,000 Units Oral Daily  . docusate sodium  100 mg Oral BID  . DULoxetine  90 mg Oral Daily  . finasteride  5 mg Oral Daily  . fluticasone  1 spray Each Nare Daily  . folic acid  1 mg Oral Daily  . mouth rinse  15 mL Mouth Rinse BID  . Melatonin  5 mg Oral QHS  . metoprolol succinate  25 mg Oral Daily  . OLANZapine  2.5 mg Oral QHS  . omega-3 acid ethyl esters  1 g Oral BID  . pantoprazole  40 mg Oral BID  . psyllium  1 packet Oral Daily  . senna-docusate  1 tablet Oral BID  . terazosin  5 mg Oral QHS  . torsemide  10 mg Oral Daily  . vitamin B-12  1,000 mcg Oral Daily    ROS: History obtained from the patient  General ROS: negative for - chills, fatigue, fever, night sweats, weight gain or weight loss Psychological ROS: negative for - behavioral disorder,  hallucinations, memory difficulties, mood swings or suicidal ideation Ophthalmic ROS: negative for - blurry vision, double vision, eye pain or loss of vision ENT ROS: HOH Allergy and Immunology ROS: negative for - hives or itchy/watery eyes Hematological and Lymphatic ROS: negative for -  bleeding problems, bruising or swollen lymph nodes Endocrine ROS: negative for - galactorrhea, hair pattern changes, polydipsia/polyuria or temperature intolerance Respiratory ROS: negative for - cough, hemoptysis, shortness of breath or wheezing Cardiovascular ROS: negative for - chest pain, dyspnea on exertion, edema or irregular heartbeat Gastrointestinal ROS: negative for - abdominal pain, diarrhea, hematemesis, nausea/vomiting or stool incontinence Genito-Urinary ROS: negative for - dysuria, hematuria, incontinence or urinary frequency/urgency Musculoskeletal ROS: negative for - joint swelling or muscular weakness Neurological ROS: as noted in HPI Dermatological ROS: negative for rash and skin lesion changes  Physical Examination: Blood pressure (!) 144/67, pulse 86, temperature (!) 97.4 F (36.3 C), temperature source Oral, resp. rate 18, height 5' 8"  (1.727 m), weight 82.2 kg (181 lb 4.8 oz), SpO2 95 %.  HEENT-  Normocephalic, no lesions, without obvious abnormality.  Normal external eye and conjunctiva.  Normal TM's bilaterally.  Normal auditory canals and external ears. Normal external nose, mucus membranes and septum.  Normal pharynx. Cardiovascular- S1, S2 normal, pulses palpable throughout   Lungs- chest clear, no wheezing, rales, normal symmetric air entry Abdomen- soft, non-tender; bowel sounds normal; no masses,  no organomegaly Extremities- no edema Lymph-no adenopathy palpable Musculoskeletal-no joint tenderness, deformity or swelling Skin-pressure sore on right  Neurological Examination   Mental Status: Alert and awake.  Speech fluent but dysarthric.  Able to follow simple commands  without difficulty. Cranial Nerves: II: Visual fields grossly normal, pupils equal, round, reactive to light and accommodation III,IV, VI: ptosis not present, extra-ocular motions intact bilaterally V,VII: smile symmetric, facial light touch sensation normal bilaterally VIII: hearing decreased bilaterally IX,X: gag reflex reduced XI: unable to perform XII: midline tongue extension Motor: Right : Upper extremity   0/5    Left:     Upper extremity   0/5  Lower extremity   0/5     Lower extremity   0/5 Increased tone in the RLE.  Tone decreased otherwise.  Sensory: Pinprick and light touch intact throughout, bilaterally Deep Tendon Reflexes: 2+ in the upper extremities and absent in the lower extremities Plantars: Right: mute   Left: mute Cerebellar: Unable to perform due to weakness Gait: unable to perform due to weakness    Laboratory Studies:  Basic Metabolic Panel: Recent Labs  Lab 06/03/18 1443  NA 140  K 3.7  CL 101  CO2 28  GLUCOSE 119*  BUN 31*  CREATININE 1.04  CALCIUM 9.3    Liver Function Tests: Recent Labs  Lab 06/03/18 1443  AST 20  ALT <5*  ALKPHOS 62  BILITOT 1.1  PROT 6.4*  ALBUMIN 2.7*   No results for input(s): LIPASE, AMYLASE in the last 168 hours. No results for input(s): AMMONIA in the last 168 hours.  CBC: Recent Labs  Lab 06/03/18 1443  WBC 7.6  NEUTROABS 6.1  HGB 9.0*  HCT 28.6*  MCV 73.9*  PLT 275    Cardiac Enzymes: Recent Labs  Lab 06/03/18 1443 06/03/18 2156 06/04/18 0338 06/04/18 0943  TROPONINI 0.07* 0.07* 0.07* 0.07*    BNP: Invalid input(s): POCBNP  CBG: No results for input(s): GLUCAP in the last 168 hours.  Microbiology: Results for orders placed or performed during the hospital encounter of 06/03/18  MRSA PCR Screening     Status: None   Collection Time: 06/04/18  1:30 AM  Result Value Ref Range Status   MRSA by PCR NEGATIVE NEGATIVE Final    Comment:        The GeneXpert MRSA Assay (FDA approved  for  NASAL specimens only), is one component of a comprehensive MRSA colonization surveillance program. It is not intended to diagnose MRSA infection nor to guide or monitor treatment for MRSA infections. Performed at [facility], [address] [place], [place] [address]     Coagulation Studies: Recent Labs    06/03/18 1443  LABPROT 21.6*  INR 1.90    Urinalysis:  Recent Labs  Lab 06/03/18 1443  COLORURINE YELLOW*  LABSPEC 1.020  PHURINE 5.0  GLUCOSEU NEGATIVE  HGBUR NEGATIVE  BILIRUBINUR NEGATIVE  KETONESUR 5*  PROTEINUR NEGATIVE  NITRITE NEGATIVE  LEUKOCYTESUR NEGATIVE    Lipid Panel:    Component Value Date/Time   CHOL 97 06/04/2018 0338   TRIG 92 06/04/2018 0338   HDL 34 (L) 06/04/2018 0338   CHOLHDL 2.9 06/04/2018 0338   VLDL 18 06/04/2018 0338   LDLCALC 45 06/04/2018 0338    HgbA1C:  Lab Results  Component Value Date   HGBA1C 6.4 (H) 06/03/2018    Urine Drug Screen:      Component Value Date/Time   LABOPIA NONE DETECTED 06/23/2016 1505   COCAINSCRNUR NONE DETECTED 06/23/2016 1505   LABBENZ NONE DETECTED 06/23/2016 1505   AMPHETMU NONE DETECTED 06/23/2016 1505   THCU NONE DETECTED 06/23/2016 1505   LABBARB NONE DETECTED 06/23/2016 1505    Alcohol Level: No results for input(s): ETH in the last 168 hours.  Other results: EKG: atrial fibrillation with ventricular pacing.  Imaging: Ct Angio Head W Or Wo Contrast  Result Date: 06/03/2018 CLINICAL DATA:  Neck pain radiating to back, unable to move, incontinent. Follow-up suspected stroke. History of stroke, hypertension, hyperlipidemia, Parkinson's disease/dementia. EXAM: CT ANGIOGRAPHY HEAD AND NECK TECHNIQUE: Multidetector CT imaging of the head and neck was performed using the standard protocol during bolus administration of intravenous contrast. Multiplanar CT image reconstructions and MIPs were obtained to evaluate the vascular anatomy. Carotid stenosis measurements (when  applicable) are obtained utilizing NASCET criteria, using the distal internal carotid diameter as the denominator. CONTRAST:  142m ISOVUE-370 IOPAMIDOL (ISOVUE-370) INJECTION 76% COMPARISON:  CT HEAD June 03, 2018 FINDINGS: CTA NECK FINDINGS: AORTIC ARCH: Normal appearance of the thoracic arch, normal branch pattern. The origins of the innominate, left Common carotid artery and subclavian artery are widely patent. RIGHT CAROTID SYSTEM: Common carotid artery is patent. Mild calcific atherosclerosis of the carotid bifurcation without hemodynamically significant stenosis by NASCET criteria. Internal carotid artery with mild luminal irregularity most compatible with atherosclerosis. LEFT CAROTID SYSTEM: Common carotid artery is patent. Trace atherosclerosis of the carotid bifurcation without hemodynamically significant stenosis by NASCET criteria. Normal appearance of the internal carotid artery. VERTEBRAL ARTERIES:Severe stenosis RIGHT vertebral artery origin. LEFT vertebral artery is dominant. Mild extrinsic deformity of the vertebral bodies due to degenerative cervical spine. SKELETON: No acute osseous process though bone windows have not been submitted. Degenerative cervical spine resulting in severe canal stenosis C6-7. OTHER NECK: Soft tissues of the neck are nonacute though, not tailored for evaluation. UPPER CHEST: Partially imaged moderate RIGHT and small LEFT pleural effusions. LEFT cardiac pacemaker. CTA HEAD FINDINGS: ANTERIOR CIRCULATION: Patent cervical internal carotid arteries, petrous, cavernous and supra clinoid internal carotid arteries. Patent anterior communicating artery. Patent anterior and middle cerebral arteries. No large vessel occlusion, significant stenosis, contrast extravasation or aneurysm. POSTERIOR CIRCULATION: Patent vertebral arteries, vertebrobasilar junction and basilar artery, as well as main branch vessels. Moderate luminal irregularity bilateral vertebral arteries compatible  with atherosclerosis. Patent posterior cerebral arteries. No large vessel occlusion, significant stenosis, contrast extravasation or aneurysm. VENOUS  SINUSES: Major dural venous sinuses are patent though not tailored for evaluation on this angiographic examination. ANATOMIC VARIANTS: None. DELAYED PHASE: No abnormal intracranial enhancement. MIP images reviewed. IMPRESSION: CTA NECK: 1. No hemodynamically significant stenosis of the carotid arteries. 2. Severe stenosis RIGHT vertebral artery origin. Patent bilateral vertebral arteries. 3. Severe canal stenosis C6-7. 4. Moderate RIGHT small LEFT pleural effusions. CTA HEAD: 1. No emergent large vessel occlusion or flow-limiting stenosis. 2. Moderate vertebral artery atherosclerosis. Aortic Atherosclerosis (ICD10-I70.0). Electronically Signed   By: Elon Alas M.D.   On: 06/03/2018 17:35   Ct Head Wo Contrast  Result Date: 06/03/2018 CLINICAL DATA:  Mental status change. EXAM: CT HEAD WITHOUT CONTRAST TECHNIQUE: Contiguous axial images were obtained from the base of the skull through the vertex without intravenous contrast. COMPARISON:  10/23/2017 FINDINGS: Brain: Stable age related cerebral atrophy, ventriculomegaly and periventricular white matter disease. No extra-axial fluid collections are identified. No CT findings for acute hemispheric infarction or intracranial hemorrhage. Probable remote high right posterior parietal white matter infarct, unchanged. No mass lesions. The brainstem and cerebellum are normal. Vascular: No worrisome vascular calcifications or obvious aneurysm. Skull: No skull fracture or bone lesion. Sinuses/Orbits: Mucoperiosteal thickening involving the left maxillary sinus. The other paranasal sinuses are clear. Other: No scalp lesions or hematoma. IMPRESSION: 1. Advanced cerebral atrophy and patchy deep periventricular white matter disease, unchanged. 2. Encephalomalacia in the high right parietal area posteriorly possibly due to  remote white matter infarct. 3. No acute intracranial findings, skull fracture or mass lesions. Electronically Signed   By: Marijo Sanes M.D.   On: 06/03/2018 15:28   Ct Angio Neck W And/or Wo Contrast  Result Date: 06/03/2018 CLINICAL DATA:  Neck pain radiating to back, unable to move, incontinent. Follow-up suspected stroke. History of stroke, hypertension, hyperlipidemia, Parkinson's disease/dementia. EXAM: CT ANGIOGRAPHY HEAD AND NECK TECHNIQUE: Multidetector CT imaging of the head and neck was performed using the standard protocol during bolus administration of intravenous contrast. Multiplanar CT image reconstructions and MIPs were obtained to evaluate the vascular anatomy. Carotid stenosis measurements (when applicable) are obtained utilizing NASCET criteria, using the distal internal carotid diameter as the denominator. CONTRAST:  161m ISOVUE-370 IOPAMIDOL (ISOVUE-370) INJECTION 76% COMPARISON:  CT HEAD June 03, 2018 FINDINGS: CTA NECK FINDINGS: AORTIC ARCH: Normal appearance of the thoracic arch, normal branch pattern. The origins of the innominate, left Common carotid artery and subclavian artery are widely patent. RIGHT CAROTID SYSTEM: Common carotid artery is patent. Mild calcific atherosclerosis of the carotid bifurcation without hemodynamically significant stenosis by NASCET criteria. Internal carotid artery with mild luminal irregularity most compatible with atherosclerosis. LEFT CAROTID SYSTEM: Common carotid artery is patent. Trace atherosclerosis of the carotid bifurcation without hemodynamically significant stenosis by NASCET criteria. Normal appearance of the internal carotid artery. VERTEBRAL ARTERIES:Severe stenosis RIGHT vertebral artery origin. LEFT vertebral artery is dominant. Mild extrinsic deformity of the vertebral bodies due to degenerative cervical spine. SKELETON: No acute osseous process though bone windows have not been submitted. Degenerative cervical spine resulting in  severe canal stenosis C6-7. OTHER NECK: Soft tissues of the neck are nonacute though, not tailored for evaluation. UPPER CHEST: Partially imaged moderate RIGHT and small LEFT pleural effusions. LEFT cardiac pacemaker. CTA HEAD FINDINGS: ANTERIOR CIRCULATION: Patent cervical internal carotid arteries, petrous, cavernous and supra clinoid internal carotid arteries. Patent anterior communicating artery. Patent anterior and middle cerebral arteries. No large vessel occlusion, significant stenosis, contrast extravasation or aneurysm. POSTERIOR CIRCULATION: Patent vertebral arteries, vertebrobasilar junction and basilar artery, as well as  main branch vessels. Moderate luminal irregularity bilateral vertebral arteries compatible with atherosclerosis. Patent posterior cerebral arteries. No large vessel occlusion, significant stenosis, contrast extravasation or aneurysm. VENOUS SINUSES: Major dural venous sinuses are patent though not tailored for evaluation on this angiographic examination. ANATOMIC VARIANTS: None. DELAYED PHASE: No abnormal intracranial enhancement. MIP images reviewed. IMPRESSION: CTA NECK: 1. No hemodynamically significant stenosis of the carotid arteries. 2. Severe stenosis RIGHT vertebral artery origin. Patent bilateral vertebral arteries. 3. Severe canal stenosis C6-7. 4. Moderate RIGHT small LEFT pleural effusions. CTA HEAD: 1. No emergent large vessel occlusion or flow-limiting stenosis. 2. Moderate vertebral artery atherosclerosis. Aortic Atherosclerosis (ICD10-I70.0). Electronically Signed   By: Elon Alas M.D.   On: 06/03/2018 17:35   Dg Chest Portable 1 View  Result Date: 06/03/2018 CLINICAL DATA:  Generalized weakness EXAM: PORTABLE CHEST 1 VIEW COMPARISON:  03/06/2018 FINDINGS: Cardiac shadow is enlarged. Pacing device is again seen. The lungs are well aerated without focal infiltrate or sizable effusion. Mild chronic interstitial changes are again seen and stable. No acute bony  abnormality is noted. IMPRESSION: Chronic interstitial changes without acute abnormality. Electronically Signed   By: Inez Catalina M.D.   On: 06/03/2018 15:58    Danny Grimes: 84 y.o. male with a history of PD and stroke.  History of afib as well on Eliquis and ASA.  Now with quadriplegia.  Does not report sensory changes.  Exam not suggestive of weakness being secondary to PD either.  Unclear onset of weakness.  Head CT reviewed and shows chronic changes from old infarct but no acute changes.  Unable to perform MRI.  Acute infarct remains on the differential.  Patient on adequate coverage for stroke.  Will rule out other possibilities as well.  Had flu-like illness in March but symptoms do not appear to be progressive and therefore will not take patient off anticoagulation at this time for LP.   CT angio shows no evidence of LVO but right vertebral artery origin severe stenosis noted.  Severe spinal stenosis noted at C6-7 as well.  A1c 6.4, LDL 45 on a statin.    Stroke Risk Factors - atrial fibrillation, hyperlipidemia and hypertension  Plan: 1. ESR, CRP 2. Neurosurgery to evaluate spinal stenosis as possible etiology for weakness 3. PT consult, OT consult, Speech consult 4. Agree with continued Eliquis and ASA 5. Frequent neuro checks 6. Decrease Sinemet with plans to possibly discontinue.  Would decrease to one tablet TID at this time.     Alexis Goodell, MD Neurology [phone] 06/04/2018, 2:03 PM

## 2018-06-04 NOTE — Plan of Care (Signed)
  Problem: Education: Goal: Knowledge of General Education information will improve Outcome: Progressing   Problem: Health Behavior/Discharge Planning: Goal: Ability to manage health-related needs will improve Outcome: Progressing   Problem: Coping: Goal: Level of anxiety will decrease Outcome: Progressing   Problem: Elimination: Goal: Will not experience complications related to urinary retention Outcome: Progressing   Problem: Pain Managment: Goal: General experience of comfort will improve Outcome: Progressing   Problem: Safety: Goal: Ability to remain free from injury will improve Outcome: Progressing   Problem: Skin Integrity: Goal: Risk for impaired skin integrity will decrease Outcome: Progressing   Problem: Education: Goal: Knowledge of disease or condition will improve Outcome: Progressing Goal: Knowledge of secondary prevention will improve Outcome: Progressing Goal: Knowledge of patient specific risk factors addressed and post discharge goals established will improve Outcome: Progressing   Problem: Self-Care: Goal: Ability to communicate needs accurately will improve Outcome: Progressing

## 2018-06-04 NOTE — NC FL2 (Addendum)
[place] LEVEL OF CARE SCREENING TOOL     IDENTIFICATION  Patient Name: Danny Grimes Birthdate: August 18, 1928 Sex: male Admission Date (Current Location): 06/03/2018  [place] and [place] Number:  Engineering geologist and Address:  [facility], [address] [place], [place] [address]      Provider Number: [id number]  Attending Physician Name and Address:  Bettey Costa, MD  Relative Name and Phone Number:  Teige Rountree [phone] son    Current Level of Care: Hospital Recommended Level of Care: ALF Prior Approval Number:    Date Approved/Denied:   PASRR Number:  [id number]  Discharge Plan: [place]    Current Diagnoses: Patient Active Problem List   Diagnosis Date Noted  . Weakness 06/03/2018  . Generalized weakness 02/21/2018  . Dementia in Parkinson's disease ([place]) 06/24/2016  . CHF (congestive heart failure) ([place]) 06/24/2016  . A-fib ([place]) 06/24/2016  . HTN (hypertension) 06/24/2016  . Hyperlipidemia 06/24/2016    Orientation RESPIRATION BLADDER Height & Weight     Self, Time, Place  Normal Incontinent Weight: 181 lb 4.8 oz (82.2 kg) Height:  5\' 8"  (172.7 cm)  BEHAVIORAL SYMPTOMS/MOOD NEUROLOGICAL BOWEL NUTRITION STATUS  (none) (none) Continent Diet(NPO to be advanced )  AMBULATORY STATUS COMMUNICATION OF NEEDS Skin   Extensive Assist Verbally Other (Comment)(Stage 1 on Hip )                       Personal Care Assistance Level of Assistance  Bathing, Feeding, Dressing Bathing Assistance: Limited assistance Feeding assistance: Independent Dressing Assistance: Limited assistance     Functional Limitations Info  Sight, Hearing, Speech Sight Info: Adequate Hearing Info: Adequate Speech Info: Adequate    SPECIAL CARE FACTORS FREQUENCY                       Contractures Contractures Info: Not present    Additional Factors Info  Code Status, Allergies Code Status Info: Partial Code  Allergies  Info: NKA              Discharge Medications: Please see discharge summary for a list of discharge medications. TAKE these medications   acetaminophen 325 MG tablet Commonly known as:  TYLENOL Take 650 mg by mouth 3 (three) times daily.   apixaban 5 MG Tabs tablet Commonly known as:  ELIQUIS Take 5 mg by mouth every 12 (twelve) hours.   atorvastatin 80 MG tablet Commonly known as:  LIPITOR Take 40 mg by mouth at bedtime.   DULoxetine 30 MG capsule Commonly known as:  CYMBALTA Take 90 mg by mouth daily.   finasteride 5 MG tablet Commonly known as:  PROSCAR Take 5 mg by mouth daily.   ibuprofen 400 MG tablet Commonly known as:  ADVIL,MOTRIN Take 400 mg by mouth as needed for mild pain or moderate pain.   Melatonin 3 MG Tabs Take 6 mg by mouth at bedtime.   metoprolol succinate 50 MG 24 hr tablet Commonly known as:  TOPROL-XL Take 25 mg by mouth daily. Take with or immediately following a meal.   morphine CONCENTRATE 10 mg / 0.5 ml concentrated solution Take 0.5 mLs (10 mg total) by mouth every 4 (four) hours as needed for severe pain, anxiety or shortness of breath.   OLANZapine 2.5 MG tablet Commonly known as:  ZYPREXA Take 2.5 mg by mouth at bedtime.   pantoprazole 40 MG tablet Commonly known as:  PROTONIX Take 40 mg by mouth  2 (two) times daily.   senna-docusate 8.6-50 MG tablet Commonly known as:  Senokot-S Take 1 tablet by mouth 2 (two) times daily.   terazosin 5 MG capsule Commonly known as:  HYTRIN Take 5 mg by mouth at bedtime.      Relevant Imaging Results:  Relevant Lab Results:   Additional Information    Shanigua Gibb  Louretta Shorten, LCSWA

## 2018-06-04 NOTE — Evaluation (Signed)
Occupational Therapy Evaluation Patient Details Name: Danny Grimes MRN: [id number] DOB: 1928-10-28 Today's Date: 06/04/2018    History of Present Illness 84yo male pt presented to ER secondary to episode of decreased responsiveness, worsening R-sided weakness; admitted for TIA/CVA work-up.  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 MRI unable due to [facility]. Neurology recommending neuro surgery consult.   Clinical Impression   Pt seen for OT evaluation this date. Prior to hospital admission, per chart review of previous admission, pt was ambulatory for short distances with RW, to/from dining hall at ALF.  Pt required assist for bathing from staff and ALF managed medication and meals. Pt with L sided hemiparesis from previous CVA. Now with near flaccidity bilaterally with impaired sensation, UEs>LEs, distal>proximal. Pt requires total/dependent assist for all bed mobility and ADL at this time. Upright/OOB mobility attempts deferred pending full neurology assessment and plan (presentation/symptoms consistent with possible SCI involvement). Flat call bell placed beside pt's L cheek so he could access help. Pt would benefit from skilled OT to address noted impairments and functional limitations (see below for any additional details) in order to maximize safety and independence while minimizing falls risk and caregiver burden.  Upon hospital discharge, recommend pt discharge to [facility].     Follow Up Recommendations  SNF    Equipment Recommendations  Other (comment)(TBD)    Recommendations for Other Services       Precautions / Restrictions Precautions Precautions: Fall Restrictions Weight Bearing Restrictions: No      Mobility Bed Mobility               General bed mobility comments: deferred significant bed mobility, pending neurology assessment and additional work up; anticipate dep assist required  due to flaccid extremities. With +2 dependent assist, gently rolled pt to L side with pillows for support, per pt request and pt noting improved comfort  Transfers                 General transfer comment: deferred pending neurology assessment and additional work up; anticipate dep assist required due to flaccid extremities    Balance                                           ADL either performed or assessed with clinical judgement   ADL Overall ADL's : Needs assistance/impaired                                       General ADL Comments: dependent for all ADL at this time secondary to near flaccidity in all extremities.     Vision Patient Visual Report: No change from baseline       Perception     Praxis      Pertinent Vitals/Pain Pain Assessment: 0-10 Pain Score: 9  Faces Pain Scale: Hurts little more Pain Location: cervical spine Pain Descriptors / Indicators: Aching;Grimacing;Guarding Pain Intervention(s): Limited activity within patient's tolerance;Monitored during session;Repositioned     Hand Dominance     Extremity/Trunk Assessment Upper Extremity Assessment Upper Extremity Assessment: (trace biceps activation in R UE; otherwise, generally flaccid without active movement/control.  Partial subluxation noted to R shoulder GH complex.  Impaired sensation bilat, distally > proximally.  Globally hypotonic)   Lower  Extremity Assessment Lower Extremity Assessment: (trace movement L ankle DF; otherwise, generally flaccid.  Negative clonus, negative babinski.  Questionable sensation-inconsistent responses (though appears better than UE sensation).  Globally hypotonic)       Communication Communication Communication: HOH   Cognition Arousal/Alertness: Awake/alert Behavior During Therapy: WFL for tasks assessed/performed Overall Cognitive Status: No family/caregiver present to determine baseline cognitive functioning                                  General Comments: oriented to self, generally to situation. Able to respond appropriately to all questions asked. Able to verbalize how he wanted to be positioned in the bed for improved comfort   General Comments  BUE, BLE cold to touch    Exercises     Shoulder Instructions      Home Living Family/patient expects to be discharged to:: Assisted living                             Home Equipment: Walker - 2 wheels;Wheelchair - manual   Additional Comments: Resident of [facility]; ambulatory with RW per patient report (has some baseline weakness in L UE, but able to grasp/manage RW)      Prior Functioning/Environment Level of Independence: Needs assistance        Comments: Per previous documentation, ambulatory with RW in room/facility; ambulates to/from dining hall for meals (~40 yards away); assist for showering from staff as needed. Meals/meds provided by ALF.        OT Problem List: Decreased strength;Impaired tone;Impaired UE functional use;Pain;Impaired balance (sitting and/or standing)      OT Treatment/Interventions: Self-care/ADL training;Therapeutic exercise;Therapeutic activities;Neuromuscular education;DME and/or AE instruction;Patient/family education    OT Goals(Current goals can be found in the care plan section) Acute Rehab OT Goals Patient Stated Goal: move my arms again OT Goal Formulation: With patient Time For Goal Achievement: 06/18/18 Potential to Achieve Goals: Fair ADL Goals Pt Will Perform Grooming: with max assist;bed level Additional ADL Goal #1: Pt will demonstrate use of head turns for flat call bell to maximize safety and his ability to alert staff to his needs/wishes.  OT Frequency: Min 2X/week   Barriers to D/C:            Co-evaluation              AM-PAC PT "6 Clicks" Daily Activity     Outcome Measure Help from another person eating meals?: Total Help from another  person taking care of personal grooming?: Total Help from another person toileting, which includes using toliet, bedpan, or urinal?: Total Help from another person bathing (including washing, rinsing, drying)?: Total Help from another person to put on and taking off regular upper body clothing?: Total Help from another person to put on and taking off regular lower body clothing?: Total 6 Click Score: 6   End of Session    Activity Tolerance: Patient limited by pain Patient left: in bed;with call bell/phone within reach;with SCD's reapplied;with bed alarm set(flat call bell placed beside L cheek )  OT Visit Diagnosis: Other abnormalities of gait and mobility (R26.89);Hemiplegia and hemiparesis;Muscle weakness (generalized) (M62.81) Hemiplegia - Right/Left: Right(both, R is new) Hemiplegia - dominant/non-dominant: Dominant Hemiplegia - caused by: Unspecified                Time: 2376-2831 OT Time Calculation (min): 19 min Charges:  OT General Charges $OT Visit: 1 Visit OT Evaluation $OT Eval High Complexity: 1 High  Jeni Salles, MPH, MS, OTR/L ascom [phone] 06/04/18, 3:14 PM

## 2018-06-04 NOTE — Consult Note (Signed)
[place] Nurse wound consult note Reason for Consult: pressure injury Wound type: Stage 1 pressure injury right trochanter  Pressure Injury POA: Yes Measurement: 1.5cm x 1.5cm x 0cm  Wound bed: non blanchable dark pink, not open Drainage (amount, consistency, odor) none Periwound: intact  Dressing procedure/placement/frequency: Silicone foam to protect Turn and reposition every 2 hours.   Discussed POC with patient and bedside nurse.  Re consult if needed, will not follow at this time. Thanks  Yarimar Lavis [employer], RN,CWOCN, CNS, [place] [phone])

## 2018-06-04 NOTE — Progress Notes (Signed)
*  PRELIMINARY RESULTS* Echocardiogram 2D Echocardiogram has been performed.  Sherrie Sport 06/04/2018, 12:29 PM

## 2018-06-04 NOTE — Evaluation (Signed)
Clinical/Bedside Swallow Evaluation Patient Details  Name: Danny Grimes MRN: [id number] Date of Birth: [birth year]  Today's Date: 06/04/2018 Time: SLP Start Time (ACUTE ONLY): 44 SLP Stop Time (ACUTE ONLY): 1120 SLP Time Calculation (min) (ACUTE ONLY): 60 min  Past Medical History:  Past Medical History:  Diagnosis Date  . Adenocarcinoma ([place])   . Anxiety   . Bilateral renal masses   . CHF (congestive heart failure) ([place])   . Colon polyp   . Degenerative joint disease   . Depression   . DNR (do not resuscitate)   . Fitting and adjustment of cardiac pacemaker   . Hemiparesis affecting left side as late effect of cerebrovascular accident (CVA) ([place])   . Hyperlipemia   . Hypertension   . Parkinson disease ([place])   . Paroxysmal atrial fibrillation ([facility])   . Peripheral arterial disease ([place])   . Skin cancer    Past Surgical History:  Past Surgical History:  Procedure Laterality Date  . insert / replace/ remove peacemaker     HPI:  Pt is a 84 y/o male with past medical history of multiple issues including GERD on a PPI, old CVA w/ hemiparesis of the left side, CHF, hypertension, Parkinson's disease on Sinemet, atrial fibrillation and history of cancer presents the emergency department from his nursing home due to concern from staff that he was less responsive than usual.  The patient was last known well 2 days ago.  He reports that he is now weak on his right side.  Sensation is normal in all 4 extremities but he cannot move his arms or grip with his hands in either upper extremity.  CTA of his head and neck revealed severe stenosis of the right vertebral artery origin but patent vascular flow throughout the head and neck and no obvious areas of acute ischemia.  While at bedside the patient was drinking from a straw and appeared to have a minor aspiration episode.  Pulse oximetry remained normal but the patient states that he is too weak to give a good cough.  Due to suspected stroke and  new onset of right-sided weakness the emergency department staff, hospitalist service for further evaluation. Pt presents w/ bilateral UE flaccidness - he reported he "used to move my Left arm some". Pt is verbally conversive w/ redcued volume of speech in connected sentences - could be related to his baseline Parkinson's Dis. Pt is able to answer questions re: self and follow a few basic commands w/ oral tasks. Head CT revealed Advanced cerebral atrophy and patchy deep periventricular white matter dis., Encephalomalacia in the high right parietal area posteriorly; No acute intracranial findings.    Assessment / Plan / Recommendation Clinical Impression  Pt appears to present w/ fairly adequate oropharyngeal phase swallow function w/ reduced risk for aspiration when following general aspiration precautions w/ modified solid food trials. Pt does have a dx of Parkinson's Dis. and h/o old CVA w/ L sided weakness w/ new R UE weakness now causing him to be a dependent feeder. Pt is also missing most of his bottom dentition w/ an upper denture plate baseline. Pt was positioned upright and presented w/ trials of thin liquids via Cup and Straw(pt could not help to hold cup) and purees/softened solids w/ no immediate, overt s/s of aspiration noted; clear vocal quality b/t trials and no overt respiratory decline post trials. Initiated w/ cup sips then moved to straw sips for easier intake pt stated - slight decreased coordination inconsistently w/ initiation of  sucking but this did not increase w/ trials. Suspect this could be impacted by the baseline Parkinson's Dis. Oral phase was grossly [facility] w/ liquids and softened, broken-down foods - pt required min more time for gumming/mashing the softened foods and for oral clearing. Pt's OM exam appeared grossly [facility] w/ min lingual rolling noted(related to Parkinson's Dis?). Pt requires full feeding support d/t being unable to use UEs. Recommend a Mech Soft diet consistency for  softened, moistened foods w/ general aspiration precautions. The broken down meats/foods will be easier for mastication d/t missing bottom dentition. Full feeding assistance at meals. Recommend Pills WHOLE in Puree for safer swallowing. Recommend Dietician f/u for nutritional support. NSG and MD updated. ST services will f/u for toleration of diet and education w/ pt/family. Recommend REFLUX precautions d/t increase Belching during po's; baseline GERD dx.  SLP Visit Diagnosis: Dysphagia, oropharyngeal phase (R13.12)(baseline Parkinson's Dis)    Aspiration Risk  Mild aspiration risk(but reduced following precautions)    Diet Recommendation  Mech Soft diet w/ MINCED meats w/ Gravy to moisten; Thin liquids. General aspiration precautions; feeding support at meals. REFLUX precautions.   Medication Administration: Whole meds with puree(for safer swallowing)    Other  Recommendations Recommended Consults: (Dietician f/u) Oral Care Recommendations: Oral care BID;Staff/trained caregiver to provide oral care Other Recommendations: (n/a)   Follow up Recommendations None      Frequency and Duration min 2x/week  1 week       Prognosis Prognosis for Safe Diet Advancement: Fair(-Good) Barriers to Reach Goals: Severity of deficits(UE weakness)      Swallow Study   General Date of Onset: 06/03/18 HPI: Pt is a 84 y/o male with past medical history of multiple issues including GERD on a PPI, old CVA w/ hemiparesis of the left side, CHF, hypertension, Parkinson's disease on Sinemet, atrial fibrillation and history of cancer presents the emergency department from his nursing home due to concern from staff that he was less responsive than usual.  The patient was last known well 2 days ago.  He reports that he is now weak on his right side.  Sensation is normal in all 4 extremities but he cannot move his arms or grip with his hands in either upper extremity.  CTA of his head and neck revealed severe stenosis  of the right vertebral artery origin but patent vascular flow throughout the head and neck and no obvious areas of acute ischemia.  While at bedside the patient was drinking from a straw and appeared to have a minor aspiration episode.  Pulse oximetry remained normal but the patient states that he is too weak to give a good cough.  Due to suspected stroke and new onset of right-sided weakness the emergency department staff, hospitalist service for further evaluation. Pt presents w/ bilateral UE flaccidness - he reported he "used to move my Left arm some". Pt is verbally conversive w/ redcued volume of speech in connected sentences - could be related to his baseline Parkinson's Dis. Pt is able to answer questions re: self and follow a few basic commands w/ oral tasks. Head CT revealed Advanced cerebral atrophy and patchy deep periventricular white matter dis., Encephalomalacia in the high right parietal area posteriorly; No acute intracranial findings.  Type of Study: Bedside Swallow Evaluation Previous Swallow Assessment: none reported but pt has been given Pills in puree, Crushed Diet Prior to this Study: NPO(regular diet at [place]) Temperature Spikes Noted: No(wbc 7.6) Respiratory Status: Nasal cannula(1 liters) History of Recent  Intubation: No Behavior/Cognition: Alert;Cooperative;Pleasant mood;Distractible;Requires cueing Oral Cavity Assessment: Dry Oral Care Completed by SLP: Recent completion by staff Oral Cavity - Dentition: Dentures, top;Missing dentition(on bottom; poor condition) Vision: Functional for self-feeding(but n/a d/t UE weakness) Self-Feeding Abilities: Total assist Patient Positioning: Upright in bed Baseline Vocal Quality: Breathy(dysphonia intermittently) Volitional Cough: (Fair) Volitional Swallow: Able to elicit    Oral/Motor/Sensory Function     Ice Chips Ice chips: Within functional limits Presentation: Spoon(fed; 3 trials)   Thin Liquid Thin Liquid: Within  functional limits(adequate being fed) Presentation: Cup;Straw(fed; ~10 ozs total) Other Comments: pt requires full feeding support d/t being unable to use UEs. Initiated w/ cup sips then moved to straw sips - slight decreased coordination inconsistently w/ initiation of sucking but this did not increase w/ trials. Suspect this could be impacted by the baseline Parkinson's Dis.     Nectar Thick Nectar Thick Liquid: Not tested   Honey Thick Honey Thick Liquid: Not tested   Puree Puree: Within functional limits Presentation: Spoon(fed; 10 trials)   Solid   GO   Solid: Impaired Presentation: Spoon(3 trials; fed) Oral Phase Impairments: Impaired mastication(missing most lower dentition) Oral Phase Functional Implications: Impaired mastication(missing most lower dentition) Pharyngeal Phase Impairments: (none) Other Comments: broke down and moistened foods well        Orinda Kenner, MS, CCC-SLP [place] 06/04/2018,12:17 PM

## 2018-06-04 NOTE — Clinical Social Work Note (Signed)
Clinical Social Work Assessment  Patient Details  Name: Danny Grimes MRN: [id number] Date of Birth: July 15, 1928  Date of referral:  06/04/18               Reason for consult:  Facility Placement                Permission sought to share information with:  Case Manager, Customer service manager, Family Supports Permission granted to share information::  Yes, Verbal Permission Granted  Name::        Agency::     Relationship::     Contact Information:     Housing/Transportation Living arrangements for the past 2 months:  [place] of Information:  Patient Patient Interpreter Needed:  None Criminal Activity/Legal Involvement Pertinent to Current Situation/Hospitalization:  No - Comment as needed Significant Relationships:  Adult Children Lives with:  Facility Resident Do you feel safe going back to the place where you live?  Yes Need for family participation in patient care:  Yes (Comment)  Care giving concerns:  Patient is a long term resident at [facility] living   Social Worker assessment / plan:  CSW met with patient to discuss discharge planning. CSW introduced self and explained role. CSW explained PT recommendation of SNF. Patient asked that CSW speak with his son regarding discharge planning. Patient did confirm that he lives at [facility]. 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 CSW contacted patient's son Danny Grimes [phone]. Son confirmed that patient has lived at [facility] for about 2 years. 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 CSW explained PT recommendation for SNF. Son states that if there is anything going on with his dad medically and he would be in the hospital for any length of time he would want patient transferred to the [place] hospital due to his veteran benefits. CSW asked son if he would like patient to go to a [place] funded SNF if he ends up staying here at [facility]. Son states that he would  prefer a SNF with VA benefit rather than using Medicare benefits. Son also requested to speak with the attending MD. CSW notified MD that son would like to speak with her as well as be transferred to the [facility] hospital. CSW also notified RN CM of son's wishes. CSW will begin bed search in case patient does not transfer. CSW will follow for discharge planning.   Employment status:  Retired Forensic scientist:  [employer] PT Recommendations:  [place] / Referral to community resources:  [place]  Patient/Family's Response to care:  Patient is alert and oriented x2 but prefers his son be involved in decision making.   Patient/Family's Understanding of and Emotional Response to Diagnosis, Current Treatment, and Prognosis:  Son would like patient transferred to [facility] hospital. He thanked CSW for assistance   Emotional Assessment Appearance:  Appears stated age Attitude/Demeanor/Rapport:    Affect (typically observed):  Pleasant, Calm Orientation:  Oriented to Self, Oriented to Place Alcohol / Substance use:  Not Applicable Psych involvement (Current and /or in the community):  No (Comment)  Discharge Needs  Concerns to be addressed:  Discharge Planning Concerns Readmission within the last 30 days:  No Current discharge risk:  None Barriers to Discharge:  Continued Medical Work up   [employer], [place] 06/04/2018, 2:09 PM

## 2018-06-04 NOTE — Care Management Obs Status (Signed)
[place] NOTIFICATION   Patient Details  Name: Danny Grimes MRN: [id number] Date of Birth: 12-Jan-1928   Medicare Observation Status Notification Given:  Yes    Shelbie Ammons, RN 06/04/2018, 8:56 AM

## 2018-06-04 NOTE — Care Management Note (Signed)
Case Management Note  Patient Details  Name: Argel Pablo MRN: [id number] Date of Birth: [birth year]  Subjective/Objective: Admitted to [facility] under observation status with the diagnosis of weakness. States he last seen his doctor at the [facility] 3 weeks ago. Son is Brnadon Eoff [phone]). A resident of [place] since 05/05/18. 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 A little hard of hearing, but answered questions appropriately. Rolling walker and cane at the facility.  Ct of head negative.                    Action/Plan: Will update [employer].  Discussed observation letter. Will discuss with son also.    Expected Discharge Date:                  Expected Discharge Plan:     In-House Referral:     Discharge planning Services     Post Acute Care Choice:    Choice offered to:     DME Arranged:    DME Agency:     HH Arranged:    HH Agency:     Status of Service:     If discussed at [employer], dates discussed:    Additional Comments:  Shelbie Ammons, RN MSN CCM Care Management [phone] 06/04/2018, 8:47 AM

## 2018-06-04 NOTE — Care Management (Signed)
Telephone call to son, Nickolai Rinks. [phone]). States that he would like his father transferred to [facility]. Discussed with Mr. Hermann Dottavio. States that he would like to be transferred to [facility] Will get transfer forms signed per Mr Blethen and Dr. Benjie Karvonen. Will fax to [facility] in [place] Mr Garden unable to sign. Gave permission to x. Two witnesses. Shelbie Ammons RN MSN CCM Care Management [phone]

## 2018-06-05 DIAGNOSIS — E440 Moderate protein-calorie malnutrition: Secondary | ICD-10-CM

## 2018-06-05 DIAGNOSIS — M4802 Spinal stenosis, cervical region: Secondary | ICD-10-CM

## 2018-06-05 DIAGNOSIS — L8990 Pressure ulcer of unspecified site, unspecified stage: Secondary | ICD-10-CM

## 2018-06-05 LAB — SEDIMENTATION RATE: SED RATE: 67 mm/h — AB (ref 0–20)

## 2018-06-05 LAB — C-REACTIVE PROTEIN: CRP: 8.6 mg/dL — AB (ref <1.0)

## 2018-06-05 MED ORDER — ENSURE ENLIVE PO LIQD
237.0000 mL | Freq: Three times a day (TID) | ORAL | Status: DC
  Administered 2018-06-05 – 2018-06-10 (×10): 237 mL via ORAL

## 2018-06-05 MED ORDER — ADULT MULTIVITAMIN W/MINERALS CH: 1.0000 | ORAL_TABLET | Freq: Every day | ORAL | Status: DC

## 2018-06-05 NOTE — Progress Notes (Signed)
[facility] [place] NAME: Danny Grimes    MR#:  [id number]  DATE OF BIRTH:  [birth year]  SUBJECTIVE:   Patient still with flaccid right side and left side at baseline REVIEW OF SYSTEMS:    Review of Systems  Constitutional: Negative for fever, chills weight loss Visit of generalized weakness HENT: Negative for ear pain, nosebleeds, congestion, facial swelling, rhinorrhea, neck pain, neck stiffness and ear discharge.   Respiratory: Negative for cough, shortness of breath, wheezing  Cardiovascular: Negative for chest pain, palpitations and leg swelling.  Gastrointestinal: Negative for heartburn, abdominal pain, vomiting, diarrhea or consitpation Genitourinary: Negative for dysuria, urgency, frequency, hematuria Musculoskeletal: Negative for back pain or joint pain Neurological: Negative for dizziness, seizures, syncope, focal weakness,  numbness and headaches.  Positive for Parkinson's disease Unable to move right side which is new for the patient Hematological: Does not bruise/bleed easily.  Psychiatric/Behavioral: Negative for hallucinations, confusion, dysphoric mood    Tolerating Diet: yes      DRUG ALLERGIES:  No Known Allergies  VITALS:  Blood pressure 131/69, pulse 79, temperature 98.8 F (37.1 C), temperature source Oral, resp. rate 17, height 5\' 8"  (1.727 m), weight 82.2 kg (181 lb 4.8 oz), SpO2 96 %.  PHYSICAL EXAMINATION:  Constitutional: Appears well-developed and well-nourished. No distress. Parkinsonian facies HENT: Normocephalic. Marland Kitchen Oropharynx is clear and moist.  Eyes: Conjunctivae and EOM are normal. PERRLA, no scleral icterus.  Neck: Normal ROM. Neck supple. No JVD. No tracheal deviation. CVS: RRR, S1/S2 +, no murmurs, no gallops, no carotid bruit.  Pulmonary: Effort and breath sounds normal, no stridor, rhonchi, wheezes, rales.  Abdominal: Soft. BS +,  no distension, tenderness, rebound or guarding.   Musculoskeletal: Patient flaccid all extremities  neuro: Alert. CN 2-12 grossly intact.  Skin: Skin is warm and dry.  Psychiatric: Normal mood and affect.      LABORATORY PANEL:   CBC Recent Labs  Lab 06/03/18 1443  WBC 7.6  HGB 9.0*  HCT 28.6*  PLT 275   ------------------------------------------------------------------------------------------------------------------  Chemistries  Recent Labs  Lab 06/03/18 1443  NA 140  K 3.7  CL 101  CO2 28  GLUCOSE 119*  BUN 31*  CREATININE 1.04  CALCIUM 9.3  AST 20  ALT <5*  ALKPHOS 62  BILITOT 1.1   ------------------------------------------------------------------------------------------------------------------  Cardiac Enzymes Recent Labs  Lab 06/03/18 2156 06/04/18 0338 06/04/18 0943  TROPONINI 0.07* 0.07* 0.07*   ------------------------------------------------------------------------------------------------------------------  RADIOLOGY:  Ct Angio Head W Or Wo Contrast  Result Date: 06/03/2018 CLINICAL DATA:  Neck pain radiating to back, unable to move, incontinent. Follow-up suspected stroke. History of stroke, hypertension, hyperlipidemia, Parkinson's disease/dementia. EXAM: CT ANGIOGRAPHY HEAD AND NECK TECHNIQUE: Multidetector CT imaging of the head and neck was performed using the standard protocol during bolus administration of intravenous contrast. Multiplanar CT image reconstructions and MIPs were obtained to evaluate the vascular anatomy. Carotid stenosis measurements (when applicable) are obtained utilizing NASCET criteria, using the distal internal carotid diameter as the denominator. CONTRAST:  122mL ISOVUE-370 IOPAMIDOL (ISOVUE-370) INJECTION 76% COMPARISON:  CT HEAD June 03, 2018 FINDINGS: CTA NECK FINDINGS: AORTIC ARCH: Normal appearance of the thoracic arch, normal branch pattern. The origins of the innominate, left Common carotid artery and subclavian artery are widely patent. RIGHT CAROTID SYSTEM:  Common carotid artery is patent. Mild calcific atherosclerosis of the carotid bifurcation without hemodynamically significant stenosis by NASCET criteria. Internal carotid artery with mild luminal irregularity most compatible with atherosclerosis. LEFT CAROTID SYSTEM: Common  carotid artery is patent. Trace atherosclerosis of the carotid bifurcation without hemodynamically significant stenosis by NASCET criteria. Normal appearance of the internal carotid artery. VERTEBRAL ARTERIES:Severe stenosis RIGHT vertebral artery origin. LEFT vertebral artery is dominant. Mild extrinsic deformity of the vertebral bodies due to degenerative cervical spine. SKELETON: No acute osseous process though bone windows have not been submitted. Degenerative cervical spine resulting in severe canal stenosis C6-7. OTHER NECK: Soft tissues of the neck are nonacute though, not tailored for evaluation. UPPER CHEST: Partially imaged moderate RIGHT and small LEFT pleural effusions. LEFT cardiac pacemaker. CTA HEAD FINDINGS: ANTERIOR CIRCULATION: Patent cervical internal carotid arteries, petrous, cavernous and supra clinoid internal carotid arteries. Patent anterior communicating artery. Patent anterior and middle cerebral arteries. No large vessel occlusion, significant stenosis, contrast extravasation or aneurysm. POSTERIOR CIRCULATION: Patent vertebral arteries, vertebrobasilar junction and basilar artery, as well as main branch vessels. Moderate luminal irregularity bilateral vertebral arteries compatible with atherosclerosis. Patent posterior cerebral arteries. No large vessel occlusion, significant stenosis, contrast extravasation or aneurysm. VENOUS SINUSES: Major dural venous sinuses are patent though not tailored for evaluation on this angiographic examination. ANATOMIC VARIANTS: None. DELAYED PHASE: No abnormal intracranial enhancement. MIP images reviewed. IMPRESSION: CTA NECK: 1. No hemodynamically significant stenosis of the carotid  arteries. 2. Severe stenosis RIGHT vertebral artery origin. Patent bilateral vertebral arteries. 3. Severe canal stenosis C6-7. 4. Moderate RIGHT small LEFT pleural effusions. CTA HEAD: 1. No emergent large vessel occlusion or flow-limiting stenosis. 2. Moderate vertebral artery atherosclerosis. Aortic Atherosclerosis (ICD10-I70.0). Electronically Signed   By: Elon Alas M.D.   On: 06/03/2018 17:35   Ct Head Wo Contrast  Result Date: 06/03/2018 CLINICAL DATA:  Mental status change. EXAM: CT HEAD WITHOUT CONTRAST TECHNIQUE: Contiguous axial images were obtained from the base of the skull through the vertex without intravenous contrast. COMPARISON:  10/23/2017 FINDINGS: Brain: Stable age related cerebral atrophy, ventriculomegaly and periventricular white matter disease. No extra-axial fluid collections are identified. No CT findings for acute hemispheric infarction or intracranial hemorrhage. Probable remote high right posterior parietal white matter infarct, unchanged. No mass lesions. The brainstem and cerebellum are normal. Vascular: No worrisome vascular calcifications or obvious aneurysm. Skull: No skull fracture or bone lesion. Sinuses/Orbits: Mucoperiosteal thickening involving the left maxillary sinus. The other paranasal sinuses are clear. Other: No scalp lesions or hematoma. IMPRESSION: 1. Advanced cerebral atrophy and patchy deep periventricular white matter disease, unchanged. 2. Encephalomalacia in the high right parietal area posteriorly possibly due to remote white matter infarct. 3. No acute intracranial findings, skull fracture or mass lesions. Electronically Signed   By: Marijo Sanes M.D.   On: 06/03/2018 15:28   Ct Angio Neck W And/or Wo Contrast  Result Date: 06/03/2018 CLINICAL DATA:  Neck pain radiating to back, unable to move, incontinent. Follow-up suspected stroke. History of stroke, hypertension, hyperlipidemia, Parkinson's disease/dementia. EXAM: CT ANGIOGRAPHY HEAD AND NECK  TECHNIQUE: Multidetector CT imaging of the head and neck was performed using the standard protocol during bolus administration of intravenous contrast. Multiplanar CT image reconstructions and MIPs were obtained to evaluate the vascular anatomy. Carotid stenosis measurements (when applicable) are obtained utilizing NASCET criteria, using the distal internal carotid diameter as the denominator. CONTRAST:  147mL ISOVUE-370 IOPAMIDOL (ISOVUE-370) INJECTION 76% COMPARISON:  CT HEAD June 03, 2018 FINDINGS: CTA NECK FINDINGS: AORTIC ARCH: Normal appearance of the thoracic arch, normal branch pattern. The origins of the innominate, left Common carotid artery and subclavian artery are widely patent. RIGHT CAROTID SYSTEM: Common carotid artery is patent. Mild calcific  atherosclerosis of the carotid bifurcation without hemodynamically significant stenosis by NASCET criteria. Internal carotid artery with mild luminal irregularity most compatible with atherosclerosis. LEFT CAROTID SYSTEM: Common carotid artery is patent. Trace atherosclerosis of the carotid bifurcation without hemodynamically significant stenosis by NASCET criteria. Normal appearance of the internal carotid artery. VERTEBRAL ARTERIES:Severe stenosis RIGHT vertebral artery origin. LEFT vertebral artery is dominant. Mild extrinsic deformity of the vertebral bodies due to degenerative cervical spine. SKELETON: No acute osseous process though bone windows have not been submitted. Degenerative cervical spine resulting in severe canal stenosis C6-7. OTHER NECK: Soft tissues of the neck are nonacute though, not tailored for evaluation. UPPER CHEST: Partially imaged moderate RIGHT and small LEFT pleural effusions. LEFT cardiac pacemaker. CTA HEAD FINDINGS: ANTERIOR CIRCULATION: Patent cervical internal carotid arteries, petrous, cavernous and supra clinoid internal carotid arteries. Patent anterior communicating artery. Patent anterior and middle cerebral arteries. No  large vessel occlusion, significant stenosis, contrast extravasation or aneurysm. POSTERIOR CIRCULATION: Patent vertebral arteries, vertebrobasilar junction and basilar artery, as well as main branch vessels. Moderate luminal irregularity bilateral vertebral arteries compatible with atherosclerosis. Patent posterior cerebral arteries. No large vessel occlusion, significant stenosis, contrast extravasation or aneurysm. VENOUS SINUSES: Major dural venous sinuses are patent though not tailored for evaluation on this angiographic examination. ANATOMIC VARIANTS: None. DELAYED PHASE: No abnormal intracranial enhancement. MIP images reviewed. IMPRESSION: CTA NECK: 1. No hemodynamically significant stenosis of the carotid arteries. 2. Severe stenosis RIGHT vertebral artery origin. Patent bilateral vertebral arteries. 3. Severe canal stenosis C6-7. 4. Moderate RIGHT small LEFT pleural effusions. CTA HEAD: 1. No emergent large vessel occlusion or flow-limiting stenosis. 2. Moderate vertebral artery atherosclerosis. Aortic Atherosclerosis (ICD10-I70.0). Electronically Signed   By: Elon Alas M.D.   On: 06/03/2018 17:35   Dg Chest Portable 1 View  Result Date: 06/03/2018 CLINICAL DATA:  Generalized weakness EXAM: PORTABLE CHEST 1 VIEW COMPARISON:  03/06/2018 FINDINGS: Cardiac shadow is enlarged. Pacing device is again seen. The lungs are well aerated without focal infiltrate or sizable effusion. Mild chronic interstitial changes are again seen and stable. No acute bony abnormality is noted. IMPRESSION: Chronic interstitial changes without acute abnormality. Electronically Signed   By: Inez Catalina M.D.   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  1.   Right-sided weakness: This is concerning for cervical spinal stenosis.  CT shows severe stenosis.   Discussed case with neurosurgery  Plan for CT myelogram today  Appreciate neurology and vascular surgery consultation   2.  History of CVA with left residual hemiparesis: Continue Eliquis, aspirin and statin  3 PAF: Continue Eliquis and metoprolol  4.  Elevated troponin: This is due to to demand ischemia with flat troponins Cardiology consultation appreicated.  5.  Parkinson's disease: Continue Sinemet  6.  Depression: Continue Cymbalta  7.  BPH: Continue Proscar And Hytrin 8.  Chronic diastolic and systolic heart failure without signs of exacerbation: Continue torsemide   Management plans discussed with the patient and he is in agreement.  CODE STATUS: limitwed  TOTAL TIME TAKING CARE OF THIS PATIENT: 22 minutes.  D/w son  and dr [facility] [place] MD at [facility] says keep doing workup here until bed available  He will also speak with  neurosurgery as well.    POSSIBLE D/C 2-3 days, DEPENDING ON CLINICAL CONDITION.   Danny Grimes M.D on 06/05/2018 at 11:22 AM  Between 7am to 6pm - Pager - [phone] After 6pm go to [email or web address] - password [email or web address] [facility]  Office  [phone]  CC: Primary care physician; System, Pcp Not In  Note: This dictation was prepared with Dragon dictation along with smaller phrase technology. Any transcriptional errors that result from this process are unintentional.

## 2018-06-05 NOTE — Progress Notes (Signed)
OT Cancellation Note  Patient Details Name: Sho Salguero MRN: [id number] DOB: 11-18-1928   Cancelled Treatment:    Reason Eval/Treat Not Completed: Other (comment). Per chart review, neurosurgery consult pending.  Will hold until consult complete and results received.   Jeni Salles, MPH, MS, OTR/L ascom [phone] 06/05/18, 1:02 PM

## 2018-06-05 NOTE — Progress Notes (Signed)
Initial Nutrition Assessment  DOCUMENTATION CODES:   Non-severe (moderate) malnutrition in context of chronic illness  INTERVENTION:  Provide Ensure Enlive po TID, each supplement provides 350 kcal and 20 grams of protein.  Provide daily MVI.  Provide 1:1 assistance with meals.  NUTRITION DIAGNOSIS:   Moderate Malnutrition related to chronic illness(hx CVA, CHF) as evidenced by moderate fat depletion, moderate muscle depletion.  GOAL:   Patient will meet greater than or equal to 90% of their needs  MONITOR:   PO intake, Supplement acceptance, Labs, Weight trends, Skin, I & O's  REASON FOR ASSESSMENT:   Consult Poor PO  ASSESSMENT:   84 year old male with PMHx of PAD, paroxysmal A-fib, hx CVA with residual left hemiparesis, Parkinson's disease, HLD, HTN, depression, anxiety, chronic combined systolic and diastolic heart failure (EF 25-30%) who presented with right-sided weakness concerning for cervical spinal stenosis.   -Following SLP evaluation on 6/12 diet was downgraded to dysphagia 3 (mechanical soft) but with minced meat and thin liquids. -Pending neurosurgery consult.  Met with patient at bedside. No family members present at time of assessment. Attempted to obtain nutrition and weight history from patient, but he was not able to provide a history. He was very concerned about the right peripheral IV. He kept asking for it to be taken out and RD was unable to redirect patient.  Very limited weight history in chart. Patient was 178.4 lbs on 02/21/2018. On admission he was 172.1 lbs. Current weight likely falsely elevated from edema.  Meal Completion: 0-25%  Medications reviewed and include: Oscal with D 2 tablets daily, vitamin D 1000 units daily, Colace, folic acid 1 mg daily, Lovaza 1 gram BID, pantoprazole, psyllium 1 packet daily, senna-docusate, vitamin B12 1000 micrograms daily.  Labs reviewed: BUN 31, CRP 8.6.  Discussed patient with SLP at time consult went  in. Patient will now require 1:1 assistance with eating. He does better with drinks than with solid foods so he would likely do well with a supplement.  NUTRITION - FOCUSED PHYSICAL EXAM:    Most Recent Value  Orbital Region  Moderate depletion  Upper Arm Region  Unable to assess [non-pitting edema]  Thoracic and Lumbar Region  Moderate depletion  Buccal Region  Moderate depletion  Temple Region  Moderate depletion  Clavicle Bone Region  Moderate depletion  Clavicle and Acromion Bone Region  Moderate depletion  Scapular Bone Region  Unable to assess  Dorsal Hand  Unable to assess  Patellar Region  Unable to assess  Anterior Thigh Region  Unable to assess  Posterior Calf Region  Unable to assess  Edema (RD Assessment)  Mild [mild pitting edema to bilateral lower extremities,  non-pitting edema to bilateral upper extremities]  Hair  Reviewed  Eyes  Reviewed  Mouth  Reviewed [poor dentition]  Skin  Reviewed  Nails  Reviewed     Diet Order:   Diet Order           Diet NPO time specified  Diet effective midnight        DIET DYS 3 Room service appropriate? Yes with Assist; Fluid consistency: Thin  Diet effective now          EDUCATION NEEDS:   Not appropriate for education at this time  Skin:  Skin Assessment: Skin Integrity Issues: Skin Integrity Issues:: Stage I Stage I: right hip (2cm x 2cm)  Last BM:  Unknown/PTA  Height:   Ht Readings from Last 1 Encounters:  06/03/18 _0  (1.727 m)  Weight:   Wt Readings from Last 1 Encounters:  06/04/18 181 lb 4.8 oz (82.2 kg)    Ideal Body Weight:  70 kg  BMI:  Body mass index is 27.57 kg/m.  Estimated Nutritional Needs:   Kcal:  1722-2000 (MSJ x 1.2-1.4)  Protein:  85-95 grams (1.1-1.2 grams/kg)  Fluid:  1.5-1.7 L/day  Willey Blade, MS, RD, LDN Office: [phone] Pager: [phone] After Hours/Weekend Pager: [phone]

## 2018-06-05 NOTE — Progress Notes (Signed)
Subjective: Patient remains unchanged.  Neurosurgery to see.    Objective: Current vital signs: BP 131/69   Pulse 79   Temp 98.8 F (37.1 C) (Oral)   Resp 17   Ht 5\' 8"  (1.727 m)   Wt 82.2 kg (181 lb 4.8 oz)   SpO2 96%   BMI 27.57 kg/m  Vital signs in last 24 hours: Temp:  [97.5 F (36.4 C)-98.8 F (37.1 C)] 98.8 F (37.1 C) (06/13 0900) Pulse Rate:  [70-92] 79 (06/13 1020) Resp:  [17-20] 17 (06/13 0900) BP: (122-190)/(66-98) 131/69 (06/13 1020) SpO2:  [91 %-97 %] 96 % (06/13 0900)  Intake/Output from previous day: 06/12 0701 - 06/13 0700 In: 580 [P.O.:580] Out: -  Intake/Output this shift: No intake/output data recorded. Nutritional status:  Diet Order           Diet NPO time specified  Diet effective midnight        DIET DYS 3 Room service appropriate? Yes with Assist; Fluid consistency: Thin  Diet effective now          Neurologic Exam: Mental Status: Alert and awake.  Speech fluent but dysarthric.  Able to follow simple commands without difficulty. Cranial Nerves: II: Visual fields grossly normal, pupils equal, round, reactive to light and accommodation III,IV, VI: ptosis not present, extra-ocular motions intact bilaterally V,VII: smile symmetric, facial light touch sensation normal bilaterally VIII: hearing decreased bilaterally IX,X: gag reflex reduced XI: unable to perform XII: midline tongue extension Motor: Right :  Upper extremity   0/5                                      Left:     Upper extremity   0/5             Lower extremity   0/5                                                  Lower extremity   0/5 Increased tone in the RLE.  Tone decreased otherwise.  Sensory: Pinprick and light touch intact throughout, bilaterally Deep Tendon Reflexes: 2+ in the upper extremities and absent in the lower extremities  Lab Results: Basic Metabolic Panel: Recent Labs  Lab 06/03/18 1443  NA 140  K 3.7  CL 101  CO2 28  GLUCOSE 119*  BUN 31*   CREATININE 1.04  CALCIUM 9.3    Liver Function Tests: Recent Labs  Lab 06/03/18 1443  AST 20  ALT <5*  ALKPHOS 62  BILITOT 1.1  PROT 6.4*  ALBUMIN 2.7*   No results for input(s): LIPASE, AMYLASE in the last 168 hours. No results for input(s): AMMONIA in the last 168 hours.  CBC: Recent Labs  Lab 06/03/18 1443  WBC 7.6  NEUTROABS 6.1  HGB 9.0*  HCT 28.6*  MCV 73.9*  PLT 275    Cardiac Enzymes: Recent Labs  Lab 06/03/18 1443 06/03/18 2156 06/04/18 0338 06/04/18 0943  TROPONINI 0.07* 0.07* 0.07* 0.07*    Lipid Panel: Recent Labs  Lab 06/04/18 0338  CHOL 97  TRIG 92  HDL 34*  CHOLHDL 2.9  VLDL 18  LDLCALC 45    CBG: No results for input(s): GLUCAP in the last 168 hours.  Microbiology: Results for orders  placed or performed during the hospital encounter of 06/03/18  MRSA PCR Screening     Status: None   Collection Time: 06/04/18  1:30 AM  Result Value Ref Range Status   MRSA by PCR NEGATIVE NEGATIVE Final    Comment:        The GeneXpert MRSA Assay (FDA approved for NASAL specimens only), is one component of a comprehensive MRSA colonization surveillance program. It is not intended to diagnose MRSA infection nor to guide or monitor treatment for MRSA infections. Performed at [facility], [address] [place], [place] [address]     Coagulation Studies: Recent Labs    06/03/18 1443  LABPROT 21.6*  INR 1.90    Imaging: Ct Angio Head W Or Wo Contrast  Result Date: 06/03/2018 CLINICAL DATA:  Neck pain radiating to back, unable to move, incontinent. Follow-up suspected stroke. History of stroke, hypertension, hyperlipidemia, Parkinson's disease/dementia. EXAM: CT ANGIOGRAPHY HEAD AND NECK TECHNIQUE: Multidetector CT imaging of the head and neck was performed using the standard protocol during bolus administration of intravenous contrast. Multiplanar CT image reconstructions and MIPs were obtained to evaluate the vascular  anatomy. Carotid stenosis measurements (when applicable) are obtained utilizing NASCET criteria, using the distal internal carotid diameter as the denominator. CONTRAST:  129mL ISOVUE-370 IOPAMIDOL (ISOVUE-370) INJECTION 76% COMPARISON:  CT HEAD June 03, 2018 FINDINGS: CTA NECK FINDINGS: AORTIC ARCH: Normal appearance of the thoracic arch, normal branch pattern. The origins of the innominate, left Common carotid artery and subclavian artery are widely patent. RIGHT CAROTID SYSTEM: Common carotid artery is patent. Mild calcific atherosclerosis of the carotid bifurcation without hemodynamically significant stenosis by NASCET criteria. Internal carotid artery with mild luminal irregularity most compatible with atherosclerosis. LEFT CAROTID SYSTEM: Common carotid artery is patent. Trace atherosclerosis of the carotid bifurcation without hemodynamically significant stenosis by NASCET criteria. Normal appearance of the internal carotid artery. VERTEBRAL ARTERIES:Severe stenosis RIGHT vertebral artery origin. LEFT vertebral artery is dominant. Mild extrinsic deformity of the vertebral bodies due to degenerative cervical spine. SKELETON: No acute osseous process though bone windows have not been submitted. Degenerative cervical spine resulting in severe canal stenosis C6-7. OTHER NECK: Soft tissues of the neck are nonacute though, not tailored for evaluation. UPPER CHEST: Partially imaged moderate RIGHT and small LEFT pleural effusions. LEFT cardiac pacemaker. CTA HEAD FINDINGS: ANTERIOR CIRCULATION: Patent cervical internal carotid arteries, petrous, cavernous and supra clinoid internal carotid arteries. Patent anterior communicating artery. Patent anterior and middle cerebral arteries. No large vessel occlusion, significant stenosis, contrast extravasation or aneurysm. POSTERIOR CIRCULATION: Patent vertebral arteries, vertebrobasilar junction and basilar artery, as well as main branch vessels. Moderate luminal  irregularity bilateral vertebral arteries compatible with atherosclerosis. Patent posterior cerebral arteries. No large vessel occlusion, significant stenosis, contrast extravasation or aneurysm. VENOUS SINUSES: Major dural venous sinuses are patent though not tailored for evaluation on this angiographic examination. ANATOMIC VARIANTS: None. DELAYED PHASE: No abnormal intracranial enhancement. MIP images reviewed. IMPRESSION: CTA NECK: 1. No hemodynamically significant stenosis of the carotid arteries. 2. Severe stenosis RIGHT vertebral artery origin. Patent bilateral vertebral arteries. 3. Severe canal stenosis C6-7. 4. Moderate RIGHT small LEFT pleural effusions. CTA HEAD: 1. No emergent large vessel occlusion or flow-limiting stenosis. 2. Moderate vertebral artery atherosclerosis. Aortic Atherosclerosis (ICD10-I70.0). Electronically Signed   By: Elon Alas M.D.   On: 06/03/2018 17:35   Ct Head Wo Contrast  Result Date: 06/03/2018 CLINICAL DATA:  Mental status change. EXAM: CT HEAD WITHOUT CONTRAST TECHNIQUE: Contiguous axial images were obtained from the  base of the skull through the vertex without intravenous contrast. COMPARISON:  10/23/2017 FINDINGS: Brain: Stable age related cerebral atrophy, ventriculomegaly and periventricular white matter disease. No extra-axial fluid collections are identified. No CT findings for acute hemispheric infarction or intracranial hemorrhage. Probable remote high right posterior parietal white matter infarct, unchanged. No mass lesions. The brainstem and cerebellum are normal. Vascular: No worrisome vascular calcifications or obvious aneurysm. Skull: No skull fracture or bone lesion. Sinuses/Orbits: Mucoperiosteal thickening involving the left maxillary sinus. The other paranasal sinuses are clear. Other: No scalp lesions or hematoma. IMPRESSION: 1. Advanced cerebral atrophy and patchy deep periventricular white matter disease, unchanged. 2. Encephalomalacia in the  high right parietal area posteriorly possibly due to remote white matter infarct. 3. No acute intracranial findings, skull fracture or mass lesions. Electronically Signed   By: Marijo Sanes M.D.   On: 06/03/2018 15:28   Ct Angio Neck W And/or Wo Contrast  Result Date: 06/03/2018 CLINICAL DATA:  Neck pain radiating to back, unable to move, incontinent. Follow-up suspected stroke. History of stroke, hypertension, hyperlipidemia, Parkinson's disease/dementia. EXAM: CT ANGIOGRAPHY HEAD AND NECK TECHNIQUE: Multidetector CT imaging of the head and neck was performed using the standard protocol during bolus administration of intravenous contrast. Multiplanar CT image reconstructions and MIPs were obtained to evaluate the vascular anatomy. Carotid stenosis measurements (when applicable) are obtained utilizing NASCET criteria, using the distal internal carotid diameter as the denominator. CONTRAST:  17mL ISOVUE-370 IOPAMIDOL (ISOVUE-370) INJECTION 76% COMPARISON:  CT HEAD June 03, 2018 FINDINGS: CTA NECK FINDINGS: AORTIC ARCH: Normal appearance of the thoracic arch, normal branch pattern. The origins of the innominate, left Common carotid artery and subclavian artery are widely patent. RIGHT CAROTID SYSTEM: Common carotid artery is patent. Mild calcific atherosclerosis of the carotid bifurcation without hemodynamically significant stenosis by NASCET criteria. Internal carotid artery with mild luminal irregularity most compatible with atherosclerosis. LEFT CAROTID SYSTEM: Common carotid artery is patent. Trace atherosclerosis of the carotid bifurcation without hemodynamically significant stenosis by NASCET criteria. Normal appearance of the internal carotid artery. VERTEBRAL ARTERIES:Severe stenosis RIGHT vertebral artery origin. LEFT vertebral artery is dominant. Mild extrinsic deformity of the vertebral bodies due to degenerative cervical spine. SKELETON: No acute osseous process though bone windows have not been  submitted. Degenerative cervical spine resulting in severe canal stenosis C6-7. OTHER NECK: Soft tissues of the neck are nonacute though, not tailored for evaluation. UPPER CHEST: Partially imaged moderate RIGHT and small LEFT pleural effusions. LEFT cardiac pacemaker. CTA HEAD FINDINGS: ANTERIOR CIRCULATION: Patent cervical internal carotid arteries, petrous, cavernous and supra clinoid internal carotid arteries. Patent anterior communicating artery. Patent anterior and middle cerebral arteries. No large vessel occlusion, significant stenosis, contrast extravasation or aneurysm. POSTERIOR CIRCULATION: Patent vertebral arteries, vertebrobasilar junction and basilar artery, as well as main branch vessels. Moderate luminal irregularity bilateral vertebral arteries compatible with atherosclerosis. Patent posterior cerebral arteries. No large vessel occlusion, significant stenosis, contrast extravasation or aneurysm. VENOUS SINUSES: Major dural venous sinuses are patent though not tailored for evaluation on this angiographic examination. ANATOMIC VARIANTS: None. DELAYED PHASE: No abnormal intracranial enhancement. MIP images reviewed. IMPRESSION: CTA NECK: 1. No hemodynamically significant stenosis of the carotid arteries. 2. Severe stenosis RIGHT vertebral artery origin. Patent bilateral vertebral arteries. 3. Severe canal stenosis C6-7. 4. Moderate RIGHT small LEFT pleural effusions. CTA HEAD: 1. No emergent large vessel occlusion or flow-limiting stenosis. 2. Moderate vertebral artery atherosclerosis. Aortic Atherosclerosis (ICD10-I70.0). Electronically Signed   By: Elon Alas M.D.   On: 06/03/2018 17:35  Dg Chest Portable 1 View  Result Date: 06/03/2018 CLINICAL DATA:  Generalized weakness EXAM: PORTABLE CHEST 1 VIEW COMPARISON:  03/06/2018 FINDINGS: Cardiac shadow is enlarged. Pacing device is again seen. The lungs are well aerated without focal infiltrate or sizable effusion. Mild chronic interstitial  changes are again seen and stable. No acute bony abnormality is noted. IMPRESSION: Chronic interstitial changes without acute abnormality. Electronically Signed   By: Inez Catalina M.D.   On: 06/03/2018 15:58    Medications:  I have reviewed the patient's current medications. Scheduled: . apixaban  5 mg Oral Q12H  . atorvastatin  40 mg Oral QHS  . calcium-vitamin D  2 tablet Oral Q breakfast  . carbidopa-levodopa  1 tablet Oral TID  . cholecalciferol  1,000 Units Oral Daily  . docusate sodium  100 mg Oral BID  . DULoxetine  90 mg Oral Daily  . finasteride  5 mg Oral Daily  . fluticasone  1 spray Each Nare Daily  . folic acid  1 mg Oral Daily  . mouth rinse  15 mL Mouth Rinse BID  . Melatonin  5 mg Oral QHS  . metoprolol succinate  25 mg Oral Daily  . OLANZapine  2.5 mg Oral QHS  . omega-3 acid ethyl esters  1 g Oral BID  . pantoprazole  40 mg Oral BID  . psyllium  1 packet Oral Daily  . senna-docusate  1 tablet Oral BID  . terazosin  5 mg Oral QHS  . torsemide  10 mg Oral Daily  . vitamin B-12  1,000 mcg Oral Daily    Assessment/Plan: Patient unchanged.  Neurosurgery to see.   Remains on Eliquis.     LOS: 1 day   Alexis Goodell, MD Neurology [phone] 06/05/2018  12:05 PM

## 2018-06-05 NOTE — Consult Note (Addendum)
Referring Physician:  No referring provider defined for this encounter.  Primary Physician:  System, Pcp Not In  Chief Complaint:  Weakness  History of Present Illness: Ladislao Cohenour is a 84 y.o. male who presents as a consult for generalized weakness. History difficult to obtain from patient.  ED note as follows:   Sanchez Hemmer is a 84 y.o. male with PMH as noted below including Parkinson's disease A. fib, CHF, and CVA who presents with generalized weakness over the last several days, unknown exact onset, bilateral, and associated with neck pain and body aches.  The patient lives in assisted living and normally is able to transfer himself from bed to wheelchair.  He was last seen at his baseline 2 days ago.  He denies fall or trauma.  He states that he cannot move his arms or legs.  Addendum: per his son, Stryker Veasey - patient was able to ambulate Sunday (4 days ago)  Review of Systems:  A 10 point review of systems is negative, except for the pertinent positives and negatives detailed in the HPI.  Past Medical History: Past Medical History:  Diagnosis Date  . Adenocarcinoma ([place])   . Anxiety   . Bilateral renal masses   . CHF (congestive heart failure) ([place])   . Colon polyp   . Degenerative joint disease   . Depression   . DNR (do not resuscitate)   . Fitting and adjustment of cardiac pacemaker   . Hemiparesis affecting left side as late effect of cerebrovascular accident (CVA) ([place])   . Hyperlipemia   . Hypertension   . Parkinson disease ([place])   . Paroxysmal atrial fibrillation ([facility])   . Peripheral arterial disease ([place])   . Skin cancer     Past Surgical History: Past Surgical History:  Procedure Laterality Date  . insert / replace/ remove peacemaker      Allergies: Allergies as of 06/03/2018  . (No Known Allergies)    Medications:  Current Facility-Administered Medications:  .  acetaminophen (TYLENOL) tablet 650 mg, 650 mg, Oral, Q6H PRN, 650 mg at 06/05/18  1014 **OR** acetaminophen (TYLENOL) suppository 650 mg, 650 mg, Rectal, Q6H PRN, Harrie Foreman, MD .  atorvastatin (LIPITOR) tablet 40 mg, 40 mg, Oral, QHS, Harrie Foreman, MD, 40 mg at 06/04/18 2116 .  calcium-vitamin D (OSCAL WITH D) 500-200 MG-UNIT per tablet 2 tablet, 2 tablet, Oral, Q breakfast, Harrie Foreman, MD, 2 tablet at 06/05/18 0956 .  carbidopa-levodopa (SINEMET IR) 25-100 MG per tablet immediate release 1 tablet, 1 tablet, Oral, TID, Alexis Goodell, MD, 1 tablet at 06/05/18 0955 .  cholecalciferol (VITAMIN D) tablet 1,000 Units, 1,000 Units, Oral, Daily, Harrie Foreman, MD, 1,000 Units at 06/05/18 0955 .  docusate sodium (COLACE) capsule 100 mg, 100 mg, Oral, BID, Harrie Foreman, MD, 100 mg at 06/05/18 0956 .  DULoxetine (CYMBALTA) DR capsule 90 mg, 90 mg, Oral, Daily, Harrie Foreman, MD, 90 mg at 06/05/18 0955 .  feeding supplement (ENSURE ENLIVE) (ENSURE ENLIVE) liquid 237 mL, 237 mL, Oral, TID BM, Mody, Sital, MD .  finasteride (PROSCAR) tablet 5 mg, 5 mg, Oral, Daily, Harrie Foreman, MD, 5 mg at 06/05/18 0956 .  fluticasone (FLONASE) 50 MCG/ACT nasal spray 1 spray, 1 spray, Each Nare, Daily, Harrie Foreman, MD, 1 spray at 06/05/18 1000 .  folic acid (FOLVITE) tablet 1 mg, 1 mg, Oral, Daily, Harrie Foreman, MD, 1 mg at 06/05/18 0956 .  loratadine (CLARITIN) tablet 10 mg,  10 mg, Oral, Daily PRN, Harrie Foreman, MD .  MEDLINE mouth rinse, 15 mL, Mouth Rinse, BID, Harrie Foreman, MD, 15 mL at 06/05/18 1000 .  Melatonin TABS 5 mg, 5 mg, Oral, QHS, Harrie Foreman, MD, 5 mg at 06/04/18 2118 .  metoprolol succinate (TOPROL-XL) 24 hr tablet 25 mg, 25 mg, Oral, Daily, Harrie Foreman, MD, 25 mg at 06/05/18 0955 .  miconazole (MICOTIN) 2 % cream 1 application, 1 application, Topical, BID PRN, Harrie Foreman, MD .  Derrill Memo ON 06/06/2018] multivitamin with minerals tablet 1 tablet, 1 tablet, Oral, Daily, Mody, Sital, MD .  OLANZapine  (ZYPREXA) tablet 2.5 mg, 2.5 mg, Oral, QHS, Harrie Foreman, MD, 2.5 mg at 06/04/18 2118 .  omega-3 acid ethyl esters (LOVAZA) capsule 1 g, 1 g, Oral, BID, Harrie Foreman, MD, 1 g at 06/05/18 0955 .  ondansetron (ZOFRAN) tablet 4 mg, 4 mg, Oral, Q6H PRN **OR** ondansetron (ZOFRAN) injection 4 mg, 4 mg, Intravenous, Q6H PRN, Harrie Foreman, MD .  pantoprazole (PROTONIX) EC tablet 40 mg, 40 mg, Oral, BID, Harrie Foreman, MD, 40 mg at 06/05/18 0955 .  polyvinyl alcohol (LIQUIFILM TEARS) 1.4 % ophthalmic solution 1 drop, 1 drop, Both Eyes, TID PRN, Harrie Foreman, MD .  psyllium (HYDROCIL/METAMUCIL) packet 1 packet, 1 packet, Oral, Daily, Harrie Foreman, MD, 1 packet at 06/05/18 [phone] .  senna-docusate (Senokot-S) tablet 1 tablet, 1 tablet, Oral, BID, Harrie Foreman, MD, 1 tablet at 06/05/18 0956 .  terazosin (HYTRIN) capsule 5 mg, 5 mg, Oral, QHS, Harrie Foreman, MD, 5 mg at 06/04/18 2119 .  torsemide (DEMADEX) tablet 10 mg, 10 mg, Oral, Daily, Harrie Foreman, MD, 10 mg at 06/05/18 1041 .  trolamine salicylate (ASPERCREME) 10 % cream, , Topical, TID PRN, Harrie Foreman, MD .  vitamin B-12 (CYANOCOBALAMIN) tablet 1,000 mcg, 1,000 mcg, Oral, Daily, Harrie Foreman, MD, 1,000 mcg at 06/05/18 5621   Social History: Social History   Tobacco Use  . Smoking status: Former Research scientist (life sciences)  . Smokeless tobacco: Never Used  Substance Use Topics  . Alcohol use: No  . Drug use: No    Family Medical History: History reviewed. No pertinent family history.  Physical Examination: Vitals:   06/05/18 1020 06/05/18 1300  BP: 131/69 131/61  Pulse: 79 73  Resp:  17  Temp:  98 F (36.7 C)  SpO2:  98%     General: Patient is well developed, well nourished, calm, collected, and in no apparent distress.  Psychiatric: Patient is non-anxious.  Head:  Pupils equal, round, and reactive to light.  ENT:  Oral mucosa appears well hydrated.  Neck:   Supple.  Full range of  motion.  Respiratory: Patient is breathing without any difficulty.  Extremities: No edema.  Skin:   On exposed skin, there are no abnormal skin lesions.  NEUROLOGICAL:  General: In no acute distress.   Awake, alert, oriented to person, place, and time.  Able to obey commands. Potential language barrier with hearing impairment.  Pupils equal round and reactive to light.  Facial tone is symmetric.  Tongue protrusion is midline.     Strength: 0/5 throughout most of extremities. He is able to perform shoulder shrug and subtle movement in left toes.   Reflexes are absent and symmetric at the biceps, triceps, brachioradialis, patella and achilles.   Bilateral upper and lower extremity sensation is intact to light touch. Hoffman's is absent.  Imaging: EXAM: CT  HEAD WITHOUT CONTRAST  TECHNIQUE: Contiguous axial images were obtained from the base of the skull through the vertex without intravenous contrast.  COMPARISON:  10/23/2017  FINDINGS: Brain: Stable age related cerebral atrophy, ventriculomegaly and periventricular white matter disease. No extra-axial fluid collections are identified. No CT findings for acute hemispheric infarction or intracranial hemorrhage. Probable remote high right posterior parietal white matter infarct, unchanged. No mass lesions. The brainstem and cerebellum are normal.  Vascular: No worrisome vascular calcifications or obvious aneurysm.  Skull: No skull fracture or bone lesion.  Sinuses/Orbits: Mucoperiosteal thickening involving the left maxillary sinus. The other paranasal sinuses are clear.  Other: No scalp lesions or hematoma.  IMPRESSION: 1. Advanced cerebral atrophy and patchy deep periventricular white matter disease, unchanged. 2. Encephalomalacia in the high right parietal area posteriorly possibly due to remote white matter infarct. 3. No acute intracranial findings, skull fracture or mass lesions.    Assessment and  Plan: Mr. Klarich is a pleasant 84 y.o. male presenting acute episode of significant generalized weakness. Patient is alert and oriented and able to obey commands. Neurosurgery was consulted for evaluation of potential spinal cord injury. Imaging would be required in order to determine cord injury/compression and if surgical intervention could provide any benefit. Note from 11/23/2010 through [facility] reveals pacemaker is Parker [id number] S/N: [id number]. [employer] was contacted to determine MRI compatibility - pacemaker is not MRI compatible. Alternative imaging would be CT myelogram, but patient would have to d/c anticoag medications in order to complete procedure. Dr. Izora Ribas spoke to son and POA, Adalberto Metzgar to determine how he would like to proceed.    I have discussed the condition with the patient, including showing the radiographs and discussing treatment options in layman's terms.  The patient may benefit from conservative management.  Thus, I have recommended the following: .  I will see the patient back in a few weeks to gauge progress.     Marin Olp, PA-C Dept. of Neurosurgery

## 2018-06-05 NOTE — Progress Notes (Signed)
PT Cancellation Note  Patient Details Name: Danny Grimes MRN: [id number] DOB: [birth year]   Cancelled Treatment:    Reason Eval/Treat Not Completed: Medical issues which prohibited therapy(Per chart review, neurosurgery consult pending.  Will hold until consult complete and results received.)   [name], PT, DPT, NCS 06/05/18, 12:23 PM [phone]

## 2018-06-05 NOTE — Care Management (Signed)
Spoke with Terri Piedra, Veteran's transport representative at [facility] All requested information faxed to [phone].  Shelbie Ammons RN MSN CCM Care Management [phone]

## 2018-06-06 NOTE — Progress Notes (Signed)
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   Respiratory: Negative for cough, shortness of breath, wheezing  Cardiovascular: Negative for chest pain, palpitations and leg swelling.  Gastrointestinal: Negative for heartburn, abdominal pain, vomiting, diarrhea or consitpation Genitourinary: Negative for dysuria, urgency, frequency, hematuria Musculoskeletal: Negative for back pain or joint pain Neurological: Negative for dizziness, seizures, syncope, focal weakness,  numbness and headaches.  Positive for Parkinson's disease Unable to move right side which is new for the patient Hematological: Does not bruise/bleed easily.  Psychiatric/Behavioral: Negative for hallucinations, confusion, dysphoric mood    Tolerating Diet: yes      DRUG ALLERGIES:  No Known Allergies  VITALS:  Blood pressure (!) 142/74, pulse 72, temperature 97.6 F (36.4 C), temperature source Oral, resp. rate 20, height 5\' 8"  (1.727 m), weight 78.2 kg (172 lb 6 oz), SpO2 96 %.  PHYSICAL EXAMINATION:  Constitutional: Appears well-developed and well-nourished. No distress. Parkinsonian facies HENT: Normocephalic. Marland Kitchen Oropharynx is clear and moist.  Eyes: Conjunctivae and EOM are normal. PERRLA, no scleral icterus.  Neck: Normal ROM. Neck supple. No JVD. No tracheal deviation. CVS: RRR, S1/S2 +, no murmurs, no gallops, no carotid bruit.  Pulmonary: Effort and breath sounds normal, no stridor, rhonchi, wheezes, rales.  Abdominal: Soft. BS +,  no distension, tenderness, rebound or guarding.   Musculoskeletal: Patient flaccid all extremities  neuro: Alert. CN 2-12 grossly intact.  Skin: Skin is warm and dry.  Psychiatric: Normal mood and affect.      LABORATORY PANEL:   CBC Recent Labs  Lab 06/03/18 1443  WBC 7.6  HGB 9.0*  HCT 28.6*  PLT 275   ------------------------------------------------------------------------------------------------------------------  Chemistries  Recent Labs  Lab 06/03/18 1443  NA 140  K 3.7  CL 101  CO2 28  GLUCOSE 119*  BUN 31*  CREATININE 1.04  CALCIUM 9.3  AST 20  ALT <5*  ALKPHOS 62  BILITOT 1.1   ------------------------------------------------------------------------------------------------------------------  Cardiac Enzymes Recent Labs  Lab 06/03/18 2156 06/04/18 0338 06/04/18 0943  TROPONINI 0.07* 0.07* 0.07*   ------------------------------------------------------------------------------------------------------------------  RADIOLOGY:  Ct Angio Head W Or Wo Contrast  Result Date: 06/03/2018 CLINICAL DATA:  Neck pain radiating to back, unable to move, incontinent. Follow-up suspected stroke. History of stroke, hypertension, hyperlipidemia, Parkinson's disease/dementia. EXAM: CT ANGIOGRAPHY HEAD AND NECK TECHNIQUE: Multidetector CT imaging of the head and neck was performed using the standard protocol during bolus administration of intravenous contrast. Multiplanar CT image reconstructions and MIPs were obtained to evaluate the vascular anatomy. Carotid stenosis measurements (when applicable) are obtained utilizing NASCET criteria, using the distal internal carotid diameter as the denominator. CONTRAST:  12mL ISOVUE-370 IOPAMIDOL (ISOVUE-370) INJECTION 76% COMPARISON:  CT HEAD June 03, 2018 FINDINGS: CTA NECK FINDINGS: AORTIC ARCH: Normal appearance of the thoracic arch, normal branch pattern. The origins of the innominate, left Common carotid artery and subclavian artery are widely patent. RIGHT CAROTID SYSTEM:  Common carotid artery is patent. Mild calcific atherosclerosis of the carotid bifurcation without hemodynamically significant stenosis by NASCET criteria. Internal carotid artery with mild luminal irregularity most compatible with atherosclerosis. LEFT CAROTID SYSTEM:  Common carotid artery is patent. Trace atherosclerosis of the carotid bifurcation without hemodynamically significant stenosis by NASCET criteria. Normal appearance of the internal carotid artery. VERTEBRAL ARTERIES:Severe stenosis RIGHT vertebral artery origin. LEFT vertebral artery is dominant. Mild extrinsic deformity of the vertebral bodies due to degenerative cervical spine. SKELETON: No acute osseous process though bone windows have not been submitted. Degenerative cervical spine resulting in severe canal stenosis C6-7. OTHER NECK: Soft tissues of the neck are nonacute though, not tailored for evaluation. UPPER CHEST: Partially imaged moderate RIGHT and small LEFT pleural effusions. LEFT cardiac pacemaker. CTA HEAD FINDINGS: ANTERIOR CIRCULATION: Patent cervical internal carotid arteries, petrous, cavernous and supra clinoid internal carotid arteries. Patent anterior communicating artery. Patent anterior and middle cerebral arteries. No large vessel occlusion, significant stenosis, contrast extravasation or aneurysm. POSTERIOR CIRCULATION: Patent vertebral arteries, vertebrobasilar junction and basilar artery, as well as main branch vessels. Moderate luminal irregularity bilateral vertebral arteries compatible with atherosclerosis. Patent posterior cerebral arteries. No large vessel occlusion, significant stenosis, contrast extravasation or aneurysm. VENOUS SINUSES: Major dural venous sinuses are patent though not tailored for evaluation on this angiographic examination. ANATOMIC VARIANTS: None. DELAYED PHASE: No abnormal intracranial enhancement. MIP images reviewed. IMPRESSION: CTA NECK: 1. No hemodynamically significant stenosis of the carotid  arteries. 2. Severe stenosis RIGHT vertebral artery origin. Patent bilateral vertebral arteries. 3. Severe canal stenosis C6-7. 4. Moderate RIGHT small LEFT pleural effusions. CTA HEAD: 1. No emergent large vessel occlusion or flow-limiting stenosis. 2. Moderate vertebral artery atherosclerosis. Aortic Atherosclerosis (ICD10-I70.0). Electronically Signed   By: Elon Alas M.D.   On: 06/03/2018 17:35   Ct Head Wo Contrast  Result Date: 06/03/2018 CLINICAL DATA:  Mental status change. EXAM: CT HEAD WITHOUT CONTRAST TECHNIQUE: Contiguous axial images were obtained from the base of the skull through the vertex without intravenous contrast. COMPARISON:  10/23/2017 FINDINGS: Brain: Stable age related cerebral atrophy, ventriculomegaly and periventricular white matter disease. No extra-axial fluid collections are identified. No CT findings for acute hemispheric infarction or intracranial hemorrhage. Probable remote high right posterior parietal white matter infarct, unchanged. No mass lesions. The brainstem and cerebellum are normal. Vascular: No worrisome vascular calcifications or obvious aneurysm. Skull: No skull fracture or bone lesion. Sinuses/Orbits: Mucoperiosteal thickening involving the left maxillary sinus. The other paranasal sinuses are clear. Other: No scalp lesions or hematoma. IMPRESSION: 1. Advanced cerebral atrophy and patchy deep periventricular white matter disease, unchanged. 2. Encephalomalacia in the high right parietal area posteriorly possibly due to remote white matter infarct. 3. No acute intracranial findings, skull fracture or mass lesions. Electronically Signed   By: Marijo Sanes M.D.   On: 06/03/2018 15:28   Ct Angio Neck W And/or Wo Contrast  Result Date: 06/03/2018 CLINICAL DATA:  Neck pain radiating to back, unable to move, incontinent. Follow-up suspected stroke. History of stroke, hypertension, hyperlipidemia, Parkinson's disease/dementia. EXAM: CT ANGIOGRAPHY HEAD AND NECK  TECHNIQUE: Multidetector CT imaging of the head and neck was performed using the standard protocol during bolus administration of intravenous contrast. Multiplanar CT image reconstructions and MIPs were obtained to evaluate the vascular anatomy. Carotid stenosis measurements (when applicable) are obtained utilizing NASCET criteria, using the distal internal carotid diameter as the denominator. CONTRAST:  161mL ISOVUE-370 IOPAMIDOL (ISOVUE-370) INJECTION 76% COMPARISON:  CT HEAD June 03, 2018 FINDINGS: CTA NECK FINDINGS: AORTIC ARCH: Normal appearance of the thoracic arch, normal branch pattern. The origins of the innominate, left Common carotid artery and subclavian artery are widely patent. RIGHT CAROTID SYSTEM: Common carotid artery is patent. Mild  calcific atherosclerosis of the carotid bifurcation without hemodynamically significant stenosis by NASCET criteria. Internal carotid artery with mild luminal irregularity most compatible with atherosclerosis. LEFT CAROTID SYSTEM: Common carotid artery is patent. Trace atherosclerosis of the carotid bifurcation without hemodynamically significant stenosis by NASCET criteria. Normal appearance of the internal carotid artery. VERTEBRAL ARTERIES:Severe stenosis RIGHT vertebral artery origin. LEFT vertebral artery is dominant. Mild extrinsic deformity of the vertebral bodies due to degenerative cervical spine. SKELETON: No acute osseous process though bone windows have not been submitted. Degenerative cervical spine resulting in severe canal stenosis C6-7. OTHER NECK: Soft tissues of the neck are nonacute though, not tailored for evaluation. UPPER CHEST: Partially imaged moderate RIGHT and small LEFT pleural effusions. LEFT cardiac pacemaker. CTA HEAD FINDINGS: ANTERIOR CIRCULATION: Patent cervical internal carotid arteries, petrous, cavernous and supra clinoid internal carotid arteries. Patent anterior communicating artery. Patent anterior and middle cerebral arteries. No  large vessel occlusion, significant stenosis, contrast extravasation or aneurysm. POSTERIOR CIRCULATION: Patent vertebral arteries, vertebrobasilar junction and basilar artery, as well as main branch vessels. Moderate luminal irregularity bilateral vertebral arteries compatible with atherosclerosis. Patent posterior cerebral arteries. No large vessel occlusion, significant stenosis, contrast extravasation or aneurysm. VENOUS SINUSES: Major dural venous sinuses are patent though not tailored for evaluation on this angiographic examination. ANATOMIC VARIANTS: None. DELAYED PHASE: No abnormal intracranial enhancement. MIP images reviewed. IMPRESSION: CTA NECK: 1. No hemodynamically significant stenosis of the carotid arteries. 2. Severe stenosis RIGHT vertebral artery origin. Patent bilateral vertebral arteries. 3. Severe canal stenosis C6-7. 4. Moderate RIGHT small LEFT pleural effusions. CTA HEAD: 1. No emergent large vessel occlusion or flow-limiting stenosis. 2. Moderate vertebral artery atherosclerosis. Aortic Atherosclerosis (ICD10-I70.0). Electronically Signed   By: Elon Alas M.D.   On: 06/03/2018 17:35   Dg Chest Portable 1 View  Result Date: 06/03/2018 CLINICAL DATA:  Generalized weakness EXAM: PORTABLE CHEST 1 VIEW COMPARISON:  03/06/2018 FINDINGS: Cardiac shadow is enlarged. Pacing device is again seen. The lungs are well aerated without focal infiltrate or sizable effusion. Mild chronic interstitial changes are again seen and stable. No acute bony abnormality is noted. IMPRESSION: Chronic interstitial changes without acute abnormality. Electronically Signed   By: Inez Catalina M.D.   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  1.   Right-sided weakness: This is concerning for severe cervical spinal stenosis.   Given patient's comorbidities and overall prognosis surgery is not recommended by Dr. Cari Caraway who is the neurosurgeon.  It is felt that patient would benefit from hospice is this is a terminal disease process.    2.  History of CVA with left residual hemiparesis:    3 PAF:   4.  Elevated troponin: This is due to to demand ischemia with flat troponins Cardiology consultation  was appreicated.  5.  Parkinson's disease: He does not need medications as he has flaccid paralysis  6.  Depression: Continue Cymbalta  7.  BPH:  8.  Chronic diastolic and systolic heart failure without signs of exacerbation:   Patient has overall poor prognosis.  Hospice has been recommended.  Management plans discussed with the patient and son and he is in agreement.  CODE STATUS: limitwed  TOTAL TIME TAKING CARE OF THIS PATIENT: 22 minutes.   Patient has been  accepted to [place].  Awaiting bed.  POSSIBLE D/C today depending on hospice availability   Shawnita Krizek M.D on 06/06/2018 at 11:00 AM  Between 7am to 6pm - Pager - [phone] After 6pm go to [email or web address] - password [email or web address] [facility]  Office  [phone]  CC: Primary care physician; System, Pcp Not In  Note: This dictation was prepared with Dragon dictation along with smaller phrase technology. Any transcriptional errors that result from this process are unintentional.

## 2018-06-06 NOTE — Clinical Social Work Note (Signed)
CSW spoke with patient's son Kedron Uno at bedside. Son states that they do not want to take any aggressive treatment measures at this time and are wanting to transition patient to inpatient hospice facility. Son states that their preference would be the [place] [facility]. CSW contacted the [place] hosp[facility] home and they do not have any beds available. Social Worker at the [facility]) states that they rarely have beds available. CSW informed patient's son of this and son requested that patient go to [facility] in [place]. CSW notified Santiago Glad, liaison for [facility] of referral. CSW will continue to follow for discharge planning and support.   [place], [place]

## 2018-06-06 NOTE — Plan of Care (Signed)
  Problem: Education: Goal: Knowledge of General Education information will improve Outcome: Progressing   Problem: Health Behavior/Discharge Planning: Goal: Ability to manage health-related needs will improve Outcome: Progressing   Problem: Clinical Measurements: Goal: Ability to maintain clinical measurements within normal limits will improve Outcome: Progressing Goal: Will remain free from infection Outcome: Progressing Goal: Diagnostic test results will improve Outcome: Progressing Goal: Respiratory complications will improve Outcome: Progressing Goal: Cardiovascular complication will be avoided Outcome: Progressing   Problem: Nutrition: Goal: Adequate nutrition will be maintained Outcome: Progressing   Problem: Coping: Goal: Level of anxiety will decrease Outcome: Progressing   Problem: Elimination: Goal: Will not experience complications related to bowel motility Outcome: Progressing Goal: Will not experience complications related to urinary retention Outcome: Progressing   Problem: Pain Managment: Goal: General experience of comfort will improve Outcome: Progressing   Problem: Safety: Goal: Ability to remain free from injury will improve Outcome: Progressing   Problem: Skin Integrity: Goal: Risk for impaired skin integrity will decrease Outcome: Progressing   Problem: Education: Goal: Knowledge of disease or condition will improve Outcome: Progressing Goal: Knowledge of secondary prevention will improve Outcome: Progressing Goal: Knowledge of patient specific risk factors addressed and post discharge goals established will improve Outcome: Progressing   Problem: Coping: Goal: Will verbalize positive feelings about self Outcome: Progressing Goal: Will identify appropriate support needs Outcome: Progressing   Problem: Health Behavior/Discharge Planning: Goal: Ability to manage health-related needs will improve Outcome: Progressing   Problem:  Self-Care: Goal: Ability to participate in self-care as condition permits will improve Outcome: Progressing Goal: Verbalization of feelings and concerns over difficulty with self-care will improve Outcome: Progressing Goal: Ability to communicate needs accurately will improve Outcome: Progressing   Problem: Nutrition: Goal: Risk of aspiration will decrease Outcome: Progressing Goal: Dietary intake will improve Outcome: Progressing

## 2018-06-06 NOTE — Care Management Important Message (Signed)
Copy of signed IM left with patient and family in room. 

## 2018-06-06 NOTE — Discharge Summary (Addendum)
[facility] NAME: Danny Grimes    MR#:  [id number]  DATE OF BIRTH:  Sep 10, 1928  DATE OF ADMISSION:  06/03/2018 ADMITTING PHYSICIAN: Harrie Foreman, MD  DATE OF DISCHARGE: 06/07/2018  PRIMARY CARE PHYSICIAN: [facility]    ADMISSION DIAGNOSIS:  Weakness of both arms [R29.898]  DISCHARGE DIAGNOSIS:  Active Problems:   Weakness   CVA (cerebral vascular accident) ([place])   Pressure injury of skin   Malnutrition of moderate degree   SECONDARY DIAGNOSIS:   Past Medical History:  Diagnosis Date  . Adenocarcinoma ([place])   . Anxiety   . Bilateral renal masses   . CHF (congestive heart failure) ([place])   . Colon polyp   . Degenerative joint disease   . Depression   . DNR (do not resuscitate)   . Fitting and adjustment of cardiac pacemaker   . Hemiparesis affecting left side as late effect of cerebrovascular accident (CVA) ([place])   . Hyperlipemia   . Hypertension   . Parkinson disease ([place])   . Paroxysmal atrial fibrillation (H[facility])   . Peripheral arterial disease ([place])   . Skin cancer     HOSPITAL COURSE:   84 year old male with history of CVA with left-sided hemiparesis, Parkinson's disease, PAF  on anticoagulation and chronic combined systolic and diastolic heart failure with ejection fraction 25 to 30% who presents due to weakness of the right side.  1.  Right-sided weakness: This is concerning for severe cervical spinal stenosis.   Given patient's comorbidities and overall prognosis surgery is not recommended by Dr. Cari Caraway who is the neurosurgeon.  It is felt that patient would benefit from hospice is this is a terminal disease process.    2.  History of CVA with left residual hemiparesis:    3 PAF:   4.  Elevated troponin: This is due to to demand ischemia with flat troponins Cardiology consultation  was appreicated.  5.  Parkinson's disease: He does not need medications as he has flaccid paralysis  6.   Depression: Continue Cymbalta  7.  BPH:  8.  Chronic diastolic and systolic heart failure without signs of exacerbation:   Patient has overall poor prognosis.  Hospice has been recommended.     DISCHARGE CONDITIONS AND DIET:   Artery condition diet as tolerated  CONSULTS OBTAINED:  Treatment Team:  Alexis Goodell, MD Algernon Huxley, MD Ubaldo Glassing Javier Docker, MD Meade Maw, MD  DRUG ALLERGIES:  No Known Allergies  DISCHARGE MEDICATIONS:   Allergies as of 06/07/2018   No Known Allergies     Medication List    STOP taking these medications   aspirin 81 MG chewable tablet   atorvastatin 80 MG tablet Commonly known as:  LIPITOR   Calcium Carbonate-Vitamin D 600-400 MG-UNIT tablet   carbidopa-levodopa 25-100 MG tablet Commonly known as:  SINEMET IR   Cholecalciferol 10000 units Tabs   DULoxetine 30 MG capsule Commonly known as:  CYMBALTA   finasteride 5 MG tablet Commonly known as:  PROSCAR   fluticasone 50 MCG/ACT nasal spray Commonly known as:  FLONASE   folic acid 1 MG tablet Commonly known as:  FOLVITE   furosemide 20 MG tablet Commonly known as:  LASIX   loratadine 10 MG tablet Commonly known as:  CLARITIN   Melatonin 3 MG Tabs   metoprolol succinate 50 MG 24 hr tablet Commonly known as:  TOPROL-XL   miconazole 2 % cream Commonly known as:  MICOTIN  OLANZapine 2.5 MG tablet Commonly known as:  ZYPREXA   omega-3 acid ethyl esters 1 g capsule Commonly known as:  LOVAZA   pantoprazole 40 MG tablet Commonly known as:  PROTONIX   psyllium 95 % Pack Commonly known as:  HYDROCIL/METAMUCIL   REFRESH TEARS 0.5 % Soln Generic drug:  carboxymethylcellulose   Salicylic Acid 6 % Sham   senna-docusate 8.6-50 MG tablet Commonly known as:  Senokot-S   SENSI-CARE PROTECTIVE BARRIER EX   terazosin 5 MG capsule Commonly known as:  HYTRIN   THERA-GESIC EX   torsemide 10 MG tablet Commonly known as:  DEMADEX   vitamin B-12 1000 MCG  tablet Commonly known as:  CYANOCOBALAMIN     TAKE these medications   acetaminophen 325 MG tablet Commonly known as:  TYLENOL Take 650 mg by mouth 3 (three) times daily.   apixaban 5 MG Tabs tablet Commonly known as:  ELIQUIS Take 5 mg by mouth every 12 (twelve) hours.   ibuprofen 400 MG tablet Commonly known as:  ADVIL,MOTRIN Take 400 mg by mouth as needed for mild pain or moderate pain.         Today   CHIEF COMPLAINT:  Patient is alert however unable to move right or left extremities.   VITAL SIGNS:  Blood pressure 130/78, pulse 78, temperature 98.6 F (37 C), temperature source Oral, resp. rate 18, height 5\' 8"  (1.727 m), weight 78.2 kg (172 lb 6 oz), SpO2 98 %.   REVIEW OF SYSTEMS:  Review of Systems  Constitutional: Positive for malaise/fatigue. Negative for chills and fever.  HENT: Negative.  Negative for ear discharge, ear pain, hearing loss, nosebleeds and sore throat.   Eyes: Negative.  Negative for blurred vision and pain.  Respiratory: Negative.  Negative for cough, hemoptysis, shortness of breath and wheezing.   Cardiovascular: Negative.  Negative for chest pain, palpitations and leg swelling.  Gastrointestinal: Negative.  Negative for abdominal pain, blood in stool, diarrhea, nausea and vomiting.  Genitourinary: Negative.  Negative for dysuria.  Musculoskeletal: Negative.  Negative for back pain.  Skin: Negative.   Neurological: Positive for focal weakness and weakness. Negative for dizziness, tremors, speech change, seizures and headaches.  Endo/Heme/Allergies: Negative.  Does not bruise/bleed easily.  Psychiatric/Behavioral: Negative.  Negative for depression, hallucinations and suicidal ideas.     PHYSICAL EXAMINATION:  GENERAL:  84 y.o.-year-old patient lying in the bed with no acute distress.  NECK:  Supple, no jugular venous distention. No thyroid enlargement, no tenderness.  LUNGS: Normal breath sounds bilaterally, no wheezing, rales,rhonchi   No use of accessory muscles of respiration.  CARDIOVASCULAR: S1, S2 normal. No murmurs, rubs, or gallops.  ABDOMEN: Soft, non-tender, non-distended. Bowel sounds present. No organomegaly or mass.  EXTREMITIES: No pedal edema, cyanosis, or clubbing.  PSYCHIATRIC: The patient is alert and oriented x 3.  SKIN: No obvious rash, lesion, or ulcer.  flacid right side  DATA REVIEW:   CBC Recent Labs  Lab 06/03/18 1443  WBC 7.6  HGB 9.0*  HCT 28.6*  PLT 275    Chemistries  Recent Labs  Lab 06/03/18 1443  NA 140  K 3.7  CL 101  CO2 28  GLUCOSE 119*  BUN 31*  CREATININE 1.04  CALCIUM 9.3  AST 20  ALT <5*  ALKPHOS 62  BILITOT 1.1    Cardiac Enzymes Recent Labs  Lab 06/03/18 2156 06/04/18 0338 06/04/18 0943  TROPONINI 0.07* 0.07* 0.07*    Microbiology Results  @MICRORSLT48 @  RADIOLOGY:  No results  found.    Allergies as of 06/07/2018   No Known Allergies     Medication List    STOP taking these medications   aspirin 81 MG chewable tablet   atorvastatin 80 MG tablet Commonly known as:  LIPITOR   Calcium Carbonate-Vitamin D 600-400 MG-UNIT tablet   carbidopa-levodopa 25-100 MG tablet Commonly known as:  SINEMET IR   Cholecalciferol 10000 units Tabs   DULoxetine 30 MG capsule Commonly known as:  CYMBALTA   finasteride 5 MG tablet Commonly known as:  PROSCAR   fluticasone 50 MCG/ACT nasal spray Commonly known as:  FLONASE   folic acid 1 MG tablet Commonly known as:  FOLVITE   furosemide 20 MG tablet Commonly known as:  LASIX   loratadine 10 MG tablet Commonly known as:  CLARITIN   Melatonin 3 MG Tabs   metoprolol succinate 50 MG 24 hr tablet Commonly known as:  TOPROL-XL   miconazole 2 % cream Commonly known as:  MICOTIN   OLANZapine 2.5 MG tablet Commonly known as:  ZYPREXA   omega-3 acid ethyl esters 1 g capsule Commonly known as:  LOVAZA   pantoprazole 40 MG tablet Commonly known as:  PROTONIX   psyllium 95 %  Pack Commonly known as:  HYDROCIL/METAMUCIL   REFRESH TEARS 0.5 % Soln Generic drug:  carboxymethylcellulose   Salicylic Acid 6 % Sham   senna-docusate 8.6-50 MG tablet Commonly known as:  Senokot-S   SENSI-CARE PROTECTIVE BARRIER EX   terazosin 5 MG capsule Commonly known as:  HYTRIN   THERA-GESIC EX   torsemide 10 MG tablet Commonly known as:  DEMADEX   vitamin B-12 1000 MCG tablet Commonly known as:  CYANOCOBALAMIN     TAKE these medications   acetaminophen 325 MG tablet Commonly known as:  TYLENOL Take 650 mg by mouth 3 (three) times daily.   apixaban 5 MG Tabs tablet Commonly known as:  ELIQUIS Take 5 mg by mouth every 12 (twelve) hours.   ibuprofen 400 MG tablet Commonly known as:  ADVIL,MOTRIN Take 400 mg by mouth as needed for mild pain or moderate pain.          Management plans discussed with the patient and son and they are in agreement. Stable for discharge hopsice  Patient should follow up with hospice  CODE STATUS:     Code Status Orders  (From admission, onward)        Start     Ordered   06/03/18 2136  Limited resuscitation (code)  Continuous    Question Answer Comment  In the event of cardiac or respiratory ARREST: Initiate Code Blue, Call Rapid Response Yes   In the event of cardiac or respiratory ARREST: Perform CPR Yes   In the event of cardiac or respiratory ARREST: Perform Intubation/Mechanical Ventilation No   In the event of cardiac or respiratory ARREST: Use NIPPV/BiPAp only if indicated Yes   In the event of cardiac or respiratory ARREST: Administer ACLS medications if indicated Yes   In the event of cardiac or respiratory ARREST: Perform Defibrillation or Cardioversion if indicated Yes   Comments discussed with patient son no intubation      06/03/18 2135    Code Status History    Date Active Date Inactive Code Status Order ID Comments User Context   02/21/2018 1044 02/22/2018 1546 Partial Code [id number]  Dustin Flock,  MD Inpatient   02/21/2018 1003 02/21/2018 1044 DNR [id number]  Dustin Flock, MD Inpatient   02/21/2018 [phone] 02/21/2018 1003  Full Code [id number]  Dustin Flock, MD ED      TOTAL TIME TAKING CARE OF THIS PATIENT: 38 minutes.    Note: This dictation was prepared with Dragon dictation along with smaller phrase technology. Any transcriptional errors that result from this process are unintentional.  Dontrel Smethers M.D on 06/07/2018 at 10:06 AM  Between 7am to 6pm - Pager - [phone] After 6pm go to [email or web address] - password [email or web address] [facility]  Office  [phone]  CC: Primary care physician; System, Pcp Not In

## 2018-06-06 NOTE — Progress Notes (Signed)
New hospice home referral received from Miguel Barrera. Patient information reviewed. At this time patient does not meet hospice home criteria, he would qualify for hospice services at [facility]. CSW Annamaria Boots made aware. Thank you. Flo Shanks RN, BSN, [facility], hospital Liaison [phone]

## 2018-06-06 NOTE — Progress Notes (Signed)
No charge note.  Palliative Medicine consult noted. Chart reviewed.   Spoke with social work regarding patient and Dr. Benjie Karvonen. Disposition issues at this time, but goals of care are clear. No family at bedside. No urgent need for palliative care. Due to high referral volume, will defer seeing this patient at this time.    Please call the Palliative Medicine Team office at [phone] if recommendations are needed in the interim.  Thank you for inviting [place] to see this patient.  Juel Burrow, DNP, AGNP-C Palliative Medicine Team Team Phone # [phone]  Pager # [phone]

## 2018-06-07 MED ORDER — CARBIDOPA-LEVODOPA 25-100 MG PO TABS
1.0000 | ORAL_TABLET | Freq: Two times a day (BID) | ORAL | Status: DC
  Administered 2018-06-07 – 2018-06-10 (×7): 1 via ORAL
  Filled 2018-06-07 (×8): qty 1

## 2018-06-07 NOTE — Progress Notes (Signed)
Spoke with patient's son. He would like DnR and patient to back to [place].  HE WOULD LIKE TO KEEP MEDICATIONS AT THIS TIME.  TIME 35 MINUTES

## 2018-06-07 NOTE — Clinical Social Work Note (Signed)
The CSW contacted [facility] to discuss possible discharge today with hospice to follow. The first person with whom the CSW spoke said her name was Santiago Glad. The representative said she would call a supervisor and hung up on the CSW prior to gaining call back information. The CSW called back and spoke with Tiara who indicated that "I am in the middle of something." The CSW explained the situation and need to contact a supervisor. Tiara did accept call back information after the CSW repeatedly asked her to note it. Tiara then advised that she would "call whenever I get a chance."  The CSW has updated the medical team and is waiting for a call back.  Santiago Bumpers, MSW, [facility] [phone]

## 2018-06-07 NOTE — Plan of Care (Signed)
  Problem: Safety: Goal: Ability to remain free from injury will improve Outcome: Progressing  Pt remains on Low Fall Risks this shift due to immobility; pt turned q2 hrs Problem: Nutrition: Goal: Adequate nutrition will be maintained Outcome: Not Progressing  Pt with poor PO intake this shift; pt total feed; refused Ensure Enlive this afternoon

## 2018-06-07 NOTE — Progress Notes (Signed)
[facility] NAME: Danny Grimes    MR#:  [id number]  DATE OF BIRTH:  05/15/1928  SUBJECTIVE:   Patient still with flaccid right side and left side at baseline  REVIEW OF SYSTEMS:    Review of Systems  Constitutional: Negative for fever, chills weight loss Visit of generalized weakness HENT: Negative for ear pain, nosebleeds, congestion, facial swelling, rhinorrhea, neck pain, neck stiffness and ear discharge.   Respiratory: Negative for cough, shortness of breath, wheezing  Cardiovascular: Negative for chest pain, palpitations and leg swelling.  Gastrointestinal: Negative for heartburn, abdominal pain, vomiting, diarrhea or consitpation Genitourinary: Negative for dysuria, urgency, frequency, hematuria Musculoskeletal: Negative for back pain or joint pain Neurological: Negative for dizziness, seizures, syncope, focal weakness,  numbness and headaches.  Positive for Parkinson's disease Unable to move right side which is new for the patient Hematological: Does not bruise/bleed easily.  Psychiatric/Behavioral: Negative for hallucinations, confusion, dysphoric mood    Tolerating Diet: yes      DRUG ALLERGIES:  No Known Allergies  VITALS:  Blood pressure 130/78, pulse 78, temperature 98.6 F (37 C), temperature source Oral, resp. rate 18, height 5\' 8"  (1.727 m), weight 78.2 kg (172 lb 6 oz), SpO2 98 %.  PHYSICAL EXAMINATION:  Constitutional: Appears well-developed and well-nourished. No distress. Parkinsonian facies HENT: Normocephalic. [place] Oropharynx is clear and moist.  Eyes: Conjunctivae and EOM are normal. PERRLA, no scleral icterus.  Neck: Normal ROM. Neck supple. No JVD. No tracheal deviation. CVS: RRR, S1/S2 +, no murmurs, no gallops, no carotid bruit.  Pulmonary: Effort and breath sounds normal, no stridor, rhonchi, wheezes, rales.  Abdominal: Soft. BS +,  no distension, tenderness, rebound or guarding.   Musculoskeletal: Patient flaccid all extremities  neuro: Alert. CN 2-12 grossly intact.  Skin: Skin is warm and dry.  Psychiatric: Normal mood and affect.      LABORATORY PANEL:   CBC Recent Labs  Lab 06/03/18 1443  WBC 7.6  HGB 9.0*  HCT 28.6*  PLT 275   ------------------------------------------------------------------------------------------------------------------  Chemistries  Recent Labs  Lab 06/03/18 1443  NA 140  K 3.7  CL 101  CO2 28  GLUCOSE 119*  BUN 31*  CREATININE 1.04  CALCIUM 9.3  AST 20  ALT <5*  ALKPHOS 62  BILITOT 1.1   ------------------------------------------------------------------------------------------------------------------  Cardiac Enzymes Recent Labs  Lab 06/03/18 2156 06/04/18 0338 06/04/18 0943  TROPONINI 0.07* 0.07* 0.07*   ------------------------------------------------------------------------------------------------------------------  RADIOLOGY:  Ct Angio Head W Or Wo Contrast  Result Date: 06/03/2018 CLINICAL DATA:  Neck pain radiating to back, unable to move, incontinent. Follow-up suspected stroke. History of stroke, hypertension, hyperlipidemia, Parkinson's disease/dementia. EXAM: CT ANGIOGRAPHY HEAD AND NECK TECHNIQUE: Multidetector CT imaging of the head and neck was performed using the standard protocol during bolus administration of intravenous contrast. Multiplanar CT image reconstructions and MIPs were obtained to evaluate the vascular anatomy. Carotid stenosis measurements (when applicable) are obtained utilizing NASCET criteria, using the distal internal carotid diameter as the denominator. CONTRAST:  124mL ISOVUE-370 IOPAMIDOL (ISOVUE-370) INJECTION 76% COMPARISON:  CT HEAD June 03, 2018 FINDINGS: CTA NECK FINDINGS: AORTIC ARCH: Normal appearance of the thoracic arch, normal branch pattern. The origins of the innominate, left Common carotid artery and subclavian artery are widely patent. RIGHT CAROTID SYSTEM:  Common carotid artery is patent. Mild calcific atherosclerosis of the carotid bifurcation without hemodynamically significant stenosis by NASCET criteria. Internal carotid artery with mild luminal irregularity most compatible with atherosclerosis. LEFT CAROTID SYSTEM:  Common carotid artery is patent. Trace atherosclerosis of the carotid bifurcation without hemodynamically significant stenosis by NASCET criteria. Normal appearance of the internal carotid artery. VERTEBRAL ARTERIES:Severe stenosis RIGHT vertebral artery origin. LEFT vertebral artery is dominant. Mild extrinsic deformity of the vertebral bodies due to degenerative cervical spine. SKELETON: No acute osseous process though bone windows have not been submitted. Degenerative cervical spine resulting in severe canal stenosis C6-7. OTHER NECK: Soft tissues of the neck are nonacute though, not tailored for evaluation. UPPER CHEST: Partially imaged moderate RIGHT and small LEFT pleural effusions. LEFT cardiac pacemaker. CTA HEAD FINDINGS: ANTERIOR CIRCULATION: Patent cervical internal carotid arteries, petrous, cavernous and supra clinoid internal carotid arteries. Patent anterior communicating artery. Patent anterior and middle cerebral arteries. No large vessel occlusion, significant stenosis, contrast extravasation or aneurysm. POSTERIOR CIRCULATION: Patent vertebral arteries, vertebrobasilar junction and basilar artery, as well as main branch vessels. Moderate luminal irregularity bilateral vertebral arteries compatible with atherosclerosis. Patent posterior cerebral arteries. No large vessel occlusion, significant stenosis, contrast extravasation or aneurysm. VENOUS SINUSES: Major dural venous sinuses are patent though not tailored for evaluation on this angiographic examination. ANATOMIC VARIANTS: None. DELAYED PHASE: No abnormal intracranial enhancement. MIP images reviewed. IMPRESSION: CTA NECK: 1. No hemodynamically significant stenosis of the carotid  arteries. 2. Severe stenosis RIGHT vertebral artery origin. Patent bilateral vertebral arteries. 3. Severe canal stenosis C6-7. 4. Moderate RIGHT small LEFT pleural effusions. CTA HEAD: 1. No emergent large vessel occlusion or flow-limiting stenosis. 2. Moderate vertebral artery atherosclerosis. Aortic Atherosclerosis (ICD10-I70.0). Electronically Signed   By: Elon Alas M.D.   On: 06/03/2018 17:35   Ct Head Wo Contrast  Result Date: 06/03/2018 CLINICAL DATA:  Mental status change. EXAM: CT HEAD WITHOUT CONTRAST TECHNIQUE: Contiguous axial images were obtained from the base of the skull through the vertex without intravenous contrast. COMPARISON:  10/23/2017 FINDINGS: Brain: Stable age related cerebral atrophy, ventriculomegaly and periventricular white matter disease. No extra-axial fluid collections are identified. No CT findings for acute hemispheric infarction or intracranial hemorrhage. Probable remote high right posterior parietal white matter infarct, unchanged. No mass lesions. The brainstem and cerebellum are normal. Vascular: No worrisome vascular calcifications or obvious aneurysm. Skull: No skull fracture or bone lesion. Sinuses/Orbits: Mucoperiosteal thickening involving the left maxillary sinus. The other paranasal sinuses are clear. Other: No scalp lesions or hematoma. IMPRESSION: 1. Advanced cerebral atrophy and patchy deep periventricular white matter disease, unchanged. 2. Encephalomalacia in the high right parietal area posteriorly possibly due to remote white matter infarct. 3. No acute intracranial findings, skull fracture or mass lesions. Electronically Signed   By: Marijo Sanes M.D.   On: 06/03/2018 15:28   Ct Angio Neck W And/or Wo Contrast  Result Date: 06/03/2018 CLINICAL DATA:  Neck pain radiating to back, unable to move, incontinent. Follow-up suspected stroke. History of stroke, hypertension, hyperlipidemia, Parkinson's disease/dementia. EXAM: CT ANGIOGRAPHY HEAD AND NECK  TECHNIQUE: Multidetector CT imaging of the head and neck was performed using the standard protocol during bolus administration of intravenous contrast. Multiplanar CT image reconstructions and MIPs were obtained to evaluate the vascular anatomy. Carotid stenosis measurements (when applicable) are obtained utilizing NASCET criteria, using the distal internal carotid diameter as the denominator. CONTRAST:  124mL ISOVUE-370 IOPAMIDOL (ISOVUE-370) INJECTION 76% COMPARISON:  CT HEAD June 03, 2018 FINDINGS: CTA NECK FINDINGS: AORTIC ARCH: Normal appearance of the thoracic arch, normal branch pattern. The origins of the innominate, left Common carotid artery and subclavian artery are widely patent. RIGHT CAROTID SYSTEM: Common carotid artery is patent. Mild  calcific atherosclerosis of the carotid bifurcation without hemodynamically significant stenosis by NASCET criteria. Internal carotid artery with mild luminal irregularity most compatible with atherosclerosis. LEFT CAROTID SYSTEM: Common carotid artery is patent. Trace atherosclerosis of the carotid bifurcation without hemodynamically significant stenosis by NASCET criteria. Normal appearance of the internal carotid artery. VERTEBRAL ARTERIES:Severe stenosis RIGHT vertebral artery origin. LEFT vertebral artery is dominant. Mild extrinsic deformity of the vertebral bodies due to degenerative cervical spine. SKELETON: No acute osseous process though bone windows have not been submitted. Degenerative cervical spine resulting in severe canal stenosis C6-7. OTHER NECK: Soft tissues of the neck are nonacute though, not tailored for evaluation. UPPER CHEST: Partially imaged moderate RIGHT and small LEFT pleural effusions. LEFT cardiac pacemaker. CTA HEAD FINDINGS: ANTERIOR CIRCULATION: Patent cervical internal carotid arteries, petrous, cavernous and supra clinoid internal carotid arteries. Patent anterior communicating artery. Patent anterior and middle cerebral arteries. No  large vessel occlusion, significant stenosis, contrast extravasation or aneurysm. POSTERIOR CIRCULATION: Patent vertebral arteries, vertebrobasilar junction and basilar artery, as well as main branch vessels. Moderate luminal irregularity bilateral vertebral arteries compatible with atherosclerosis. Patent posterior cerebral arteries. No large vessel occlusion, significant stenosis, contrast extravasation or aneurysm. VENOUS SINUSES: Major dural venous sinuses are patent though not tailored for evaluation on this angiographic examination. ANATOMIC VARIANTS: None. DELAYED PHASE: No abnormal intracranial enhancement. MIP images reviewed. IMPRESSION: CTA NECK: 1. No hemodynamically significant stenosis of the carotid arteries. 2. Severe stenosis RIGHT vertebral artery origin. Patent bilateral vertebral arteries. 3. Severe canal stenosis C6-7. 4. Moderate RIGHT small LEFT pleural effusions. CTA HEAD: 1. No emergent large vessel occlusion or flow-limiting stenosis. 2. Moderate vertebral artery atherosclerosis. Aortic Atherosclerosis (ICD10-I70.0). Electronically Signed   By: Elon Alas M.D.   On: 06/03/2018 17:35   Dg Chest Portable 1 View  Result Date: 06/03/2018 CLINICAL DATA:  Generalized weakness EXAM: PORTABLE CHEST 1 VIEW COMPARISON:  03/06/2018 FINDINGS: Cardiac shadow is enlarged. Pacing device is again seen. The lungs are well aerated without focal infiltrate or sizable effusion. Mild chronic interstitial changes are again seen and stable. No acute bony abnormality is noted. IMPRESSION: Chronic interstitial changes without acute abnormality. Electronically Signed   By: Inez Catalina M.D.   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  1.   Right-sided weakness: This is concerning for severe cervical spinal stenosis.   Given patient's comorbidities and overall prognosis surgery is not recommended by Dr. Cari Caraway who is the neurosurgeon.  It is felt that patient would benefit from hospice as this is a terminal disease process.    2.  History of CVA with left residual hemiparesis:  Continue statin  3 PAF: Continue metoprolol  4.  Elevated troponin: This is due to to demand ischemia with flat troponins Cardiology consultation  was appreicated.  5.  Parkinson's disease: As per neurology due to flaccid paralysis tapering off of Sinemet  6.  Depression: Continue Cymbalta  7.  BPH: Continue Hytrin and Proscar  8.  Chronic diastolic and systolic heart failure without signs of exacerbation:   Patient has overall poor prognosis.  Hospice has been recommended.  Patient will need to be discharged to skilled nursing facility with hospice Clinical social worker working on this Patient has been accepted to the [place]  as per patient and son request however no beds available Will need skilled nursing facility that the [place] approves of  Left 2 messages this morning for this patient's son to call me back We will discuss more of a comfort care approach and DNR  Management plans discussed with the patient and son and he is in agreement.  CODE STATUS: limited  TOTAL TIME TAKING CARE OF THIS PATIENT: 22 minutes.   POSSIBLE D/C soon Menachem Urbanek M.D on 06/07/2018 at 9:17 AM  Between 7am to 6pm - Pager - [phone] After 6pm go to [email or web address] - password [email or web address] [facility]  Office  [phone]  CC: Primary care physician; System, Pcp Not In  Note: This dictation was prepared with Dragon dictation along with smaller phrase technology. Any transcriptional errors that result from this process are unintentional.

## 2018-06-08 MED ORDER — SENNOSIDES-DOCUSATE SODIUM 8.6-50 MG PO TABS: 1.0000 | ORAL_TABLET | Freq: Every evening | ORAL | Status: DC | PRN

## 2018-06-08 NOTE — Progress Notes (Signed)
[name] [place] NAME: Danny Grimes    MR#:  [id number]  DATE OF BIRTH:  04-Nov-1928  SUBJECTIVE:   Patient with right sided flaccid paralysis not eating much  REVIEW OF SYSTEMS:    Review of Systems  Constitutional: Negative for fever, chills weight loss Visit of generalized weakness HENT: Negative for ear pain, nosebleeds, congestion, facial swelling, rhinorrhea, neck pain, neck stiffness and ear discharge.   Respiratory: Negative for cough, shortness of breath, wheezing  Cardiovascular: Negative for chest pain, palpitations and leg swelling.  Gastrointestinal: Negative for heartburn, abdominal pain, vomiting, diarrhea or consitpation Genitourinary: Negative for dysuria, urgency, frequency, hematuria Musculoskeletal: Negative for back pain or joint pain Neurological: Negative for dizziness, seizures, syncope, focal weakness,  numbness and headaches.  Positive for Parkinson's disease Unable to move right side which is new for the patient Hematological: Does not bruise/bleed easily.  Psychiatric/Behavioral: Negative for hallucinations, confusion, dysphoric mood    Tolerating Diet: poor appetite     DRUG ALLERGIES:  No Known Allergies  VITALS:  Blood pressure 130/78, pulse 79, temperature 98.7 F (37.1 C), temperature source Oral, resp. rate 18, height 5\' 8"  (1.727 m), weight 78.2 kg (172 lb 6 oz), SpO2 98 %.  PHYSICAL EXAMINATION:  Constitutional: Appears critically ill-appearing with Parkinsonian facies HENT: Normocephalic. Marland Kitchen Oropharynx is clear and moist.  Eyes: Conjunctivae and EOM are normal. PERRLA, no scleral icterus.  Neck: Normal ROM. Neck supple. No JVD. No tracheal deviation. CVS: RRR, S1/S2 +, no murmurs, no gallops, no carotid bruit.  Pulmonary: Effort and breath sounds normal, no stridor, rhonchi, wheezes, rales.  Abdominal: Soft. BS +,  no distension, tenderness, rebound or guarding.  Musculoskeletal: Patient  flaccid all extremities  neuro: Alert. CN 2-12 grossly intact.  Skin: Skin is warm and dry.  Psychiatric: Depressed mood this morning   LABORATORY PANEL:   CBC Recent Labs  Lab 06/03/18 1443  WBC 7.6  HGB 9.0*  HCT 28.6*  PLT 275   ------------------------------------------------------------------------------------------------------------------  Chemistries  Recent Labs  Lab 06/03/18 1443  NA 140  K 3.7  CL 101  CO2 28  GLUCOSE 119*  BUN 31*  CREATININE 1.04  CALCIUM 9.3  AST 20  ALT <5*  ALKPHOS 62  BILITOT 1.1   ------------------------------------------------------------------------------------------------------------------  Cardiac Enzymes Recent Labs  Lab 06/03/18 2156 06/04/18 0338 06/04/18 0943  TROPONINI 0.07* 0.07* 0.07*   ------------------------------------------------------------------------------------------------------------------  RADIOLOGY:  Ct Angio Head W Or Wo Contrast  Result Date: 06/03/2018 CLINICAL DATA:  Neck pain radiating to back, unable to move, incontinent. Follow-up suspected stroke. History of stroke, hypertension, hyperlipidemia, Parkinson's disease/dementia. EXAM: CT ANGIOGRAPHY HEAD AND NECK TECHNIQUE: Multidetector CT imaging of the head and neck was performed using the standard protocol during bolus administration of intravenous contrast. Multiplanar CT image reconstructions and MIPs were obtained to evaluate the vascular anatomy. Carotid stenosis measurements (when applicable) are obtained utilizing NASCET criteria, using the distal internal carotid diameter as the denominator. CONTRAST:  139mL ISOVUE-370 IOPAMIDOL (ISOVUE-370) INJECTION 76% COMPARISON:  CT HEAD June 03, 2018 FINDINGS: CTA NECK FINDINGS: AORTIC ARCH: Normal appearance of the thoracic arch, normal branch pattern. The origins of the innominate, left Common carotid artery and subclavian artery are widely patent. RIGHT CAROTID SYSTEM: Common carotid artery is patent.  Mild calcific atherosclerosis of the carotid bifurcation without hemodynamically significant stenosis by NASCET criteria. Internal carotid artery with mild luminal irregularity most compatible with atherosclerosis. LEFT CAROTID SYSTEM: Common carotid artery is patent. Trace atherosclerosis  of the carotid bifurcation without hemodynamically significant stenosis by NASCET criteria. Normal appearance of the internal carotid artery. VERTEBRAL ARTERIES:Severe stenosis RIGHT vertebral artery origin. LEFT vertebral artery is dominant. Mild extrinsic deformity of the vertebral bodies due to degenerative cervical spine. SKELETON: No acute osseous process though bone windows have not been submitted. Degenerative cervical spine resulting in severe canal stenosis C6-7. OTHER NECK: Soft tissues of the neck are nonacute though, not tailored for evaluation. UPPER CHEST: Partially imaged moderate RIGHT and small LEFT pleural effusions. LEFT cardiac pacemaker. CTA HEAD FINDINGS: ANTERIOR CIRCULATION: Patent cervical internal carotid arteries, petrous, cavernous and supra clinoid internal carotid arteries. Patent anterior communicating artery. Patent anterior and middle cerebral arteries. No large vessel occlusion, significant stenosis, contrast extravasation or aneurysm. POSTERIOR CIRCULATION: Patent vertebral arteries, vertebrobasilar junction and basilar artery, as well as main branch vessels. Moderate luminal irregularity bilateral vertebral arteries compatible with atherosclerosis. Patent posterior cerebral arteries. No large vessel occlusion, significant stenosis, contrast extravasation or aneurysm. VENOUS SINUSES: Major dural venous sinuses are patent though not tailored for evaluation on this angiographic examination. ANATOMIC VARIANTS: None. DELAYED PHASE: No abnormal intracranial enhancement. MIP images reviewed. IMPRESSION: CTA NECK: 1. No hemodynamically significant stenosis of the carotid arteries. 2. Severe stenosis  RIGHT vertebral artery origin. Patent bilateral vertebral arteries. 3. Severe canal stenosis C6-7. 4. Moderate RIGHT small LEFT pleural effusions. CTA HEAD: 1. No emergent large vessel occlusion or flow-limiting stenosis. 2. Moderate vertebral artery atherosclerosis. Aortic Atherosclerosis (ICD10-I70.0). Electronically Signed   By: Elon Alas M.D.   On: 06/03/2018 17:35   Ct Head Wo Contrast  Result Date: 06/03/2018 CLINICAL DATA:  Mental status change. EXAM: CT HEAD WITHOUT CONTRAST TECHNIQUE: Contiguous axial images were obtained from the base of the skull through the vertex without intravenous contrast. COMPARISON:  10/23/2017 FINDINGS: Brain: Stable age related cerebral atrophy, ventriculomegaly and periventricular white matter disease. No extra-axial fluid collections are identified. No CT findings for acute hemispheric infarction or intracranial hemorrhage. Probable remote high right posterior parietal white matter infarct, unchanged. No mass lesions. The brainstem and cerebellum are normal. Vascular: No worrisome vascular calcifications or obvious aneurysm. Skull: No skull fracture or bone lesion. Sinuses/Orbits: Mucoperiosteal thickening involving the left maxillary sinus. The other paranasal sinuses are clear. Other: No scalp lesions or hematoma. IMPRESSION: 1. Advanced cerebral atrophy and patchy deep periventricular white matter disease, unchanged. 2. Encephalomalacia in the high right parietal area posteriorly possibly due to remote white matter infarct. 3. No acute intracranial findings, skull fracture or mass lesions. Electronically Signed   By: Marijo Sanes M.D.   On: 06/03/2018 15:28   Ct Angio Neck W And/or Wo Contrast  Result Date: 06/03/2018 CLINICAL DATA:  Neck pain radiating to back, unable to move, incontinent. Follow-up suspected stroke. History of stroke, hypertension, hyperlipidemia, Parkinson's disease/dementia. EXAM: CT ANGIOGRAPHY HEAD AND NECK TECHNIQUE: Multidetector CT  imaging of the head and neck was performed using the standard protocol during bolus administration of intravenous contrast. Multiplanar CT image reconstructions and MIPs were obtained to evaluate the vascular anatomy. Carotid stenosis measurements (when applicable) are obtained utilizing NASCET criteria, using the distal internal carotid diameter as the denominator. CONTRAST:  187mL ISOVUE-370 IOPAMIDOL (ISOVUE-370) INJECTION 76% COMPARISON:  CT HEAD June 03, 2018 FINDINGS: CTA NECK FINDINGS: AORTIC ARCH: Normal appearance of the thoracic arch, normal branch pattern. The origins of the innominate, left Common carotid artery and subclavian artery are widely patent. RIGHT CAROTID SYSTEM: Common carotid artery is patent. Mild calcific atherosclerosis of the carotid bifurcation without  hemodynamically significant stenosis by NASCET criteria. Internal carotid artery with mild luminal irregularity most compatible with atherosclerosis. LEFT CAROTID SYSTEM: Common carotid artery is patent. Trace atherosclerosis of the carotid bifurcation without hemodynamically significant stenosis by NASCET criteria. Normal appearance of the internal carotid artery. VERTEBRAL ARTERIES:Severe stenosis RIGHT vertebral artery origin. LEFT vertebral artery is dominant. Mild extrinsic deformity of the vertebral bodies due to degenerative cervical spine. SKELETON: No acute osseous process though bone windows have not been submitted. Degenerative cervical spine resulting in severe canal stenosis C6-7. OTHER NECK: Soft tissues of the neck are nonacute though, not tailored for evaluation. UPPER CHEST: Partially imaged moderate RIGHT and small LEFT pleural effusions. LEFT cardiac pacemaker. CTA HEAD FINDINGS: ANTERIOR CIRCULATION: Patent cervical internal carotid arteries, petrous, cavernous and supra clinoid internal carotid arteries. Patent anterior communicating artery. Patent anterior and middle cerebral arteries. No large vessel occlusion,  significant stenosis, contrast extravasation or aneurysm. POSTERIOR CIRCULATION: Patent vertebral arteries, vertebrobasilar junction and basilar artery, as well as main branch vessels. Moderate luminal irregularity bilateral vertebral arteries compatible with atherosclerosis. Patent posterior cerebral arteries. No large vessel occlusion, significant stenosis, contrast extravasation or aneurysm. VENOUS SINUSES: Major dural venous sinuses are patent though not tailored for evaluation on this angiographic examination. ANATOMIC VARIANTS: None. DELAYED PHASE: No abnormal intracranial enhancement. MIP images reviewed. IMPRESSION: CTA NECK: 1. No hemodynamically significant stenosis of the carotid arteries. 2. Severe stenosis RIGHT vertebral artery origin. Patent bilateral vertebral arteries. 3. Severe canal stenosis C6-7. 4. Moderate RIGHT small LEFT pleural effusions. CTA HEAD: 1. No emergent large vessel occlusion or flow-limiting stenosis. 2. Moderate vertebral artery atherosclerosis. Aortic Atherosclerosis (ICD10-I70.0). Electronically Signed   By: Elon Alas M.D.   On: 06/03/2018 17:35   Dg Chest Portable 1 View  Result Date: 06/03/2018 CLINICAL DATA:  Generalized weakness EXAM: PORTABLE CHEST 1 VIEW COMPARISON:  03/06/2018 FINDINGS: Cardiac shadow is enlarged. Pacing device is again seen. The lungs are well aerated without focal infiltrate or sizable effusion. Mild chronic interstitial changes are again seen and stable. No acute bony abnormality is noted. IMPRESSION: Chronic interstitial changes without acute abnormality. Electronically Signed   By: Inez Catalina M.D.   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  1.  Right-sided weakness: This  is concerning for severe cervical spinal stenosis.   Given patient's comorbidities and overall prognosis surgery is not recommended by Dr. Cari Caraway who is the neurosurgeon.  It is felt that patient would benefit from hospice as this is a terminal disease process.    2.  History of CVA with left residual hemiparesis:  Continue statin  3 PAF: Continue metoprolol  4.  Elevated troponin: This is due to to demand ischemia with flat troponins Cardiology consultation  was appreicated.  5.  Parkinson's disease: As per neurology due to flaccid paralysis tapering off of Sinemet  6.  Depression: Continue Cymbalta  7.  BPH: Continue Hytrin and Proscar  8.  Chronic diastolic and systolic heart failure without signs of exacerbation:   Patient has overall poor prognosis.  Hospice has been recommended.  Patient will need to be discharged to skilled nursing facility with hospice Clinical social worker working on this Patient has been accepted to the [facility] as per patient and son request however  no beds available Will need skilled nursing facility that the [facility] approves of    Management plans discussed with the patient and son and he is in agreement.  CODE STATUS: DNR  TOTAL TIME TAKING CARE OF THIS PATIENT: 22 minutes.   POSSIBLE D/C soon Fleta Borgeson M.D on 06/08/2018 at 10:47 AM  Between 7am to 6pm - Pager - [phone] After 6pm go to [email or web address] - password [email or web address] [facility]  Office  [phone]  CC: Primary care physician; System, Pcp Not In  Note: This dictation was prepared with Dragon dictation along with smaller phrase technology. Any transcriptional errors that result from this process are unintentional.

## 2018-06-09 MED ORDER — ASPIRIN 81 MG PO CHEW
81.0000 mg | CHEWABLE_TABLET | Freq: Every day | ORAL | Status: DC
Start: 2018-06-10 — End: 2018-06-10
  Administered 2018-06-10: 81 mg via ORAL
  Filled 2018-06-09: qty 1

## 2018-06-09 NOTE — Progress Notes (Signed)
SLP Cancellation Note  Patient Details Name: Danny Grimes MRN: [id number] DOB: [birth year]   Cancelled treatment:       Reason Eval/Treat Not Completed: SLP screened, no needs identified, will sign off(chart reviewed; consulted NSG and SW). Pt continues to present w/ bilateral UE weakness as requires total assistance w/ ADLs. Pt is verbally conversive and able to verbally indicated wants/needs to [facility] staff, and w/ SLP re: his meal today. Pt does have a baseline of Parkinson's Dis. and old CVA which can both impact Cognitive status. As pt is able to adequately verbalize his wants/needs currently, and his medical status remains declined, recommend any further Cognitive-linguistic assessment/evaluation be done once at Rehab when pt feels better/medical status improved. Pt agreed. NSG/SW updated.     Orinda Kenner, MS, CCC-SLP Watson,Katherine 06/09/2018, 1:22 PM

## 2018-06-09 NOTE — Clinical Social Work Note (Signed)
CSW spoke with Marden Noble from [facility] today regarding patient. Marden Noble states that patient can return with hospice services. Marden Noble states that patient will need a hospital bed. CSW left voicemail for patient's son with above information and to ask for his preference of hospice company. CSW will continue to follow for discharge planning.   [place], [place]

## 2018-06-09 NOTE — Progress Notes (Addendum)
Speech Language Pathology Treatment: Dysphagia  Patient Details Name: Darion Milewski MRN: [id number] DOB: Oct 09, 1928 Today's Date: 06/09/2018 Time: [id number] SLP Time Calculation (min) (ACUTE ONLY): 40 min  Assessment / Plan / Recommendation Clinical Impression  Pt seen for ongoing toleration of diet, Dysphagia level 3 (minced meats) w/ Thin liquids. Pt has been tolerating this diet w/ no reported swallowing deficits noted by [facility] staff, however, pt does not eat much at meals. Pt does have a baseline of missing lower dentition in poor condition.  Pt was resting but agreed to few po's. He appears to present w/ fairly adequate oropharyngeal phase swallow function w/ reduced risk for aspiration when following general aspiration precautions w/ modified food diet. Pt does have a dx of Parkinson's Dis. and h/o old CVA w/ L sided weakness w/ new R UE weakness now causing him to be a dependent feeder - Bilateral UE weakness(concerning for severe cervical spinal stenosis per MD notes). Pt consumed trials(2) of softened, cut foods w/ min increased mastication time/effort. Pt declined further.  He then agreed to sips of liquids and sips of Ensure consuming ~8-10 ozs together w/ no immediate, overt s/s of aspiration noted; audible swallowing noted w/ liquid trials. Pt exhibited clear vocal quality b/t trials and no overt respiratory decline post trials. Initiated trials via straw for easier intake as pt stated. Oral phase was [facility] w/ liquids - pt required min more time for gumming/mashing the softened foods and for oral clearing d/t missing bottom dentition.  Recommend a Mech Soft diet consistency w/ MINCED meats, moistened foods; Thin liquids via cup/straw w/ careful monitoring for slower drinking, no large sips. Recommend general aspiration precautions. The broken down meats/foods will be easier for mastication d/t missing bottom dentition - BEST for energy conservation and overall oral intake. Full feeding assistance  at meals. Recommend Pills WHOLE in Puree for safer swallowing. Recommend Dietician f/u for nutritional support - DRINK forms best. NSG and MD updated. Recommend REFLUX precautions d/t increase Belching during po's; baseline GERD dx. Any further skilled ST Services needed for education w/ pt/family post discharge can be obtained if needed then.     HPI HPI: Pt is a 84 y/o male with past medical history of multiple issues including GERD on a PPI, old CVA w/ hemiparesis of the left side, CHF, hypertension, Parkinson's disease on Sinemet, atrial fibrillation and history of cancer presents the emergency department from his nursing home due to concern from staff that he was less responsive than usual.  The patient was last known well 2 days ago.  He reports that he is now weak on his right side.  Sensation is normal in all 4 extremities but he cannot move his arms or grip with his hands in either upper extremity.  CTA of his head and neck revealed severe stenosis of the right vertebral artery origin but patent vascular flow throughout the head and neck and no obvious areas of acute ischemia.  While at bedside the patient was drinking from a straw and appeared to have a minor aspiration episode.  Pulse oximetry remained normal but the patient states that he is too weak to give a good cough.  Due to suspected stroke and new onset of right-sided weakness the emergency department staff, hospitalist service for further evaluation. Pt presents w/ bilateral UE flaccidness - he reported he "used to move my Left arm some". Pt is verbally conversive w/ redcued volume of speech in connected sentences - could be related to his baseline Parkinson's  Dis. Pt is able to answer questions re: self and follow a few basic commands w/ oral tasks. Head CT revealed Advanced cerebral atrophy and patchy deep periventricular white matter dis., Encephalomalacia in the high right parietal area posteriorly; No acute intracranial findings.        SLP Plan  All goals met       Recommendations  Diet recommendations: Dysphagia 3 (mechanical soft);Thin liquid(w/ MINCED meats, gravy to moisten) Liquids provided via: Cup;Straw(monitor positioning and drinking slowly) Medication Administration: Whole meds with puree(for safer swallowing) Supervision: Full supervision/cueing for compensatory strategies;Trained caregiver to feed patient Compensations: Minimize environmental distractions;Slow rate;Small sips/bites;Lingual sweep for clearance of pocketing;Multiple dry swallows after each bite/sip;Follow solids with liquid Postural Changes and/or Swallow Maneuvers: Seated upright 90 degrees;Upright 30-60 min after meal                General recommendations: (Dietician f/u for nutritional support - drink forms) Oral Care Recommendations: Oral care BID;Staff/trained caregiver to provide oral care Follow up Recommendations: None(at this time; total assistance for feeding) SLP Visit Diagnosis: Dysphagia, unspecified (R13.10) Plan: All goals met       GO               Orinda Kenner, MS, CCC-SLP Watson,Katherine 06/09/2018, 1:11 PM

## 2018-06-09 NOTE — Progress Notes (Signed)
Danny Grimes at [place] NAME: Danny Grimes    MR#:  [id number]  DATE OF BIRTH:  Oct 24, 1928  SUBJECTIVE:  CHIEF COMPLAINT:   Chief Complaint  Patient presents with  . Altered Mental Status   - bed-bound at baseline, requesting for a coca cola - very hard of hearing  REVIEW OF SYSTEMS:  Review of Systems  Constitutional: Positive for malaise/fatigue. Negative for chills and fever.  HENT: Positive for hearing loss. Negative for congestion, ear discharge and nosebleeds.   Respiratory: Negative for cough, shortness of breath and wheezing.   Cardiovascular: Negative for chest pain and palpitations.  Gastrointestinal: Negative for abdominal pain, constipation, diarrhea, nausea and vomiting.  Genitourinary: Negative for dysuria.  Neurological: Positive for focal weakness. Negative for dizziness, seizures and headaches.    DRUG ALLERGIES:  No Known Allergies  VITALS:  Blood pressure (!) 145/79, pulse 79, temperature 97.7 F (36.5 C), temperature source Oral, resp. rate 19, height 5\' 8"  (1.727 m), weight 78.2 kg (172 lb 6 oz), SpO2 97 %.  PHYSICAL EXAMINATION:  Physical Exam  GENERAL:  84 y.o.-year-old elderly  patient lying in the bed with no acute distress.  EYES: Pupils equal, round, reactive to light and accommodation. No scleral icterus. Extraocular muscles intact.  HEENT: Head atraumatic, normocephalic. Oropharynx and nasopharynx clear.  NECK:  Supple, no jugular venous distention. No thyroid enlargement, no tenderness.  LUNGS: Normal breath sounds bilaterally, no wheezing, rales,rhonchi or crepitation. No use of accessory muscles of respiration.  Decreased bibasilar breath sounds CARDIOVASCULAR: S1, S2 normal. No  rubs, or gallops.  2/6 systolic murmur is present -Pacemaker on left chest noted ABDOMEN: Soft, nontender, nondistended. Bowel sounds present. No organomegaly or mass.  EXTREMITIES: No pedal edema, cyanosis, or clubbing.    NEUROLOGIC: Staggering speech, very hard of hearing.  Able to make needs known.  Complete hemiparesis of right side.  Even left upper and lower extremities are extremely weak.  PSYCHIATRIC: The patient is alert.  SKIN: No obvious rash, lesion, or ulcer.    LABORATORY PANEL:   CBC Recent Labs  Lab 06/03/18 1443  WBC 7.6  HGB 9.0*  HCT 28.6*  PLT 275   ------------------------------------------------------------------------------------------------------------------  Chemistries  Recent Labs  Lab 06/03/18 1443  NA 140  K 3.7  CL 101  CO2 28  GLUCOSE 119*  BUN 31*  CREATININE 1.04  CALCIUM 9.3  AST 20  ALT <5*  ALKPHOS 62  BILITOT 1.1   ------------------------------------------------------------------------------------------------------------------  Cardiac Enzymes Recent Labs  Lab 06/04/18 0943  TROPONINI 0.07*   ------------------------------------------------------------------------------------------------------------------  RADIOLOGY:  No results found.  EKG:   Orders placed or performed during the hospital encounter of 06/03/18  . ED EKG  . ED EKG  . EKG 12-Lead  . EKG 12-Lead    ASSESSMENT AND PLAN:   84 year old male with past medical history significant for CVA with left-sided hemiparesis, bedbound at baseline, CHF, hypertension, Parkinson's disease, history of A. fib who is coming from [place] secondary to decreased responsiveness.  1.  Right-sided hemiparesis-relatively new.  Secondary to likely severe cervical spinal stenosis. -Old left-sided weakness from a stroke.  Patient is bedbound at this time -Neurosurgery was consulted but they did not recommend any intervention due to patient's comorbidities. -Hospice has been suggested.  Family interested in a hospice home.  -Patient does not qualify at this time.  Will likely need to go to a long-term care with hospice services.  2.  Prior history of  stroke-patient on statin at this time.   Baby aspirin can be added.  3.  GERD-Protonix  4.  Hypertension-on Toprol  5.  BPH-on Proscar, Hytrin  6.  Parkinson's disease-stable on Sinemet  7.  DVT prophylaxis-teds and SCDs    All the records are reviewed and case discussed with Care Management/Social Workerr. Management plans discussed with the patient, family and they are in agreement.  CODE STATUS: DNR  TOTAL TIME TAKING CARE OF THIS PATIENT: 38 minutes.   POSSIBLE D/C IN 1-2 DAYS, DEPENDING ON CLINICAL CONDITION.   Gladstone Lighter M.D on 06/09/2018 at 12:54 PM  Between 7am to 6pm - Pager - [phone]  After 6pm go to [email or web address] - password [email or web address] [facility]  Office  [phone]  CC: Primary care physician; System, Pcp Not In

## 2018-06-09 NOTE — Care Management Important Message (Signed)
Important Message  Patient Details  Name: Danny Grimes MRN: [id number] Date of Birth: Jun 24, 1928   Medicare Important Message Given:  Yes    Juliann Pulse A Yesenia Locurto 06/09/2018, 3:28 PM

## 2018-06-09 NOTE — Progress Notes (Signed)
PT Cancellation Note  Patient Details Name: Danny Grimes MRN: [id number] DOB: [birth year]   Cancelled Treatment:    Reason Eval/Treat Not Completed: Fatigue/lethargy limiting ability to participate;Patient declined, no reason specified. Treatment attempted; pt sleeping lightly and awoken through voice. Pt states he cannot "do" therapy today; "I'm not worth anything today". Pt wishes to try and sleep. Re attempt tomorrow.    Larae Grooms, PTA 06/09/2018, 3:48 PM

## 2018-06-09 NOTE — Plan of Care (Signed)
  Problem: Education: Goal: Knowledge of General Education information will improve Outcome: Progressing   Problem: Nutrition: Goal: Adequate nutrition will be maintained Outcome: Progressing   Problem: Pain Managment: Goal: General experience of comfort will improve Outcome: Progressing   Problem: Safety: Goal: Ability to remain free from injury will improve Outcome: Progressing   

## 2018-06-09 NOTE — Clinical Social Work Note (Signed)
CSW spoke with patient's son Danny Grimes [phone]. CSW informed son that patient is not appropriate for hospice home at this time but he is eligible for hospice services. Son states that his preference is for patient to return to [facility] with hospice services. CSW explained that [facility] will be evaluating patient today to see if they can manage his needs in that level of care. CSW also offered to son that patient go to SNF with hospice services. Son states that he does not want patient going to SNF for financial reasons. CSW explained that patient's VA benefits would pay for room and board at SNF but son does not agree. His preference is that patient return to [facility] and receive hospice services. CSW will follow up with son after evaluation by [facility] today. CSW will continue to follow for discharge planning.   [place], [place]

## 2018-06-09 NOTE — Progress Notes (Signed)
Occupational Therapy Treatment Patient Details Name: Danny Grimes MRN: [id number] DOB: May 11, 1928 Today's Date: 06/09/2018    History of present illness 84yo male pt presented to ER secondary to episode of decreased responsiveness, worsening R-sided weakness; admitted for TIA/CVA work-up.  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 MRI unable due to [facility]. Neurology recommending neuro surgery consult.   OT comments  Pt seen for OT tx this date. Pt reporting pain in cervical spine/neck with any movement. Pt noted to have been incontinent of bowel (loose) and bladder. Pt rolled side to side with total/dep +2 assist with nurse tech to perform with total assist for hygiene. Pt remains near flaccid in all extremities. Able to verbalize his needs and comfort/pain. Pt repositioned on L side as pt reports this is most comfort position, pillows utilized to support positioning. Positioning optimized to support maximal skin/joint protection. Will continue therapy efforts and reassess progress next session.    Follow Up Recommendations  SNF    Equipment Recommendations  Other (comment)(TBD)    Recommendations for Other Services      Precautions / Restrictions Precautions Precautions: Fall Restrictions Weight Bearing Restrictions: No       Mobility Bed Mobility Overal bed mobility: Needs Assistance Bed Mobility: Rolling Rolling: Total assist;+2 for physical assistance         General bed mobility comments: dependent +2 assist for rolling side to side for toileting task from bed level  Transfers                      Balance                                           ADL either performed or assessed with clinical judgement   ADL Overall ADL's : Needs assistance/impaired                           Toilet Transfer Details (indicate cue type and reason): N/A,  rolling in bed to perform with max assist x2 Toileting- Clothing Manipulation and Hygiene: Total assistance;+2 for physical assistance         General ADL Comments: continues to be dependent for all ADL at this time secondary to near flaccidity in all extremities.     Vision Patient Visual Report: No change from baseline     Perception     Praxis      Cognition Arousal/Alertness: Awake/alert Behavior During Therapy: WFL for tasks assessed/performed Overall Cognitive Status: Within Functional Limits for tasks assessed                                          Exercises     Shoulder Instructions       General Comments      Pertinent Vitals/ Pain       Pain Assessment: Faces Pain Score: 0-No pain Faces Pain Scale: Hurts even more Pain Location: cervical spine with rolling  Pain Descriptors / Indicators: Aching;Grimacing;Guarding Pain Intervention(s): Limited activity within patient's tolerance;Monitored during session;Repositioned  Home Living  Prior Functioning/Environment              Frequency  Min 2X/week        Progress Toward Goals  OT Goals(current goals can now be found in the care plan section)  Progress towards OT goals: OT to reassess next treatment  Acute Rehab OT Goals Patient Stated Goal: move my arms again OT Goal Formulation: With patient Time For Goal Achievement: 06/18/18 Potential to Achieve Goals: Poor  Plan Discharge plan remains appropriate;Frequency remains appropriate    Co-evaluation                 AM-PAC PT "6 Clicks" Daily Activity     Outcome Measure   Help from another person eating meals?: Total Help from another person taking care of personal grooming?: Total Help from another person toileting, which includes using toliet, bedpan, or urinal?: Total Help from another person bathing (including washing, rinsing, drying)?: Total Help  from another person to put on and taking off regular upper body clothing?: Total Help from another person to put on and taking off regular lower body clothing?: Total 6 Click Score: 6    End of Session    OT Visit Diagnosis: Other abnormalities of gait and mobility (R26.89);Hemiplegia and hemiparesis;Muscle weakness (generalized) (M62.81) Hemiplegia - Right/Left: Right Hemiplegia - dominant/non-dominant: Dominant Hemiplegia - caused by: Unspecified   Activity Tolerance Patient limited by pain   Patient Left in bed;with SCD's reapplied;with bed alarm set;with call bell/phone within reach   Nurse Communication          Time: 3267-1245 OT Time Calculation (min): 27 min  Charges: OT General Charges $OT Visit: 1 Visit OT Treatments $Self Care/Home Management : 23-37 mins  Jeni Salles, MPH, MS, OTR/L ascom [phone] 06/09/18, 3:40 PM

## 2018-06-09 NOTE — Progress Notes (Signed)
Attempted to feed pt lunch. Pt took 3 bites of  Pot roast,1 bite of cream potatoes and drank 1 can of cola. Refused to take anything else

## 2018-06-09 NOTE — Discharge Summary (Addendum)
[place] at [place] NAME: Danny Grimes    MR#:  [id number]  DATE OF BIRTH:  12/31/1927  DATE OF ADMISSION:  06/03/2018   ADMITTING PHYSICIAN: Harrie Foreman, MD  DATE OF DISCHARGE:  06/10/18  PRIMARY CARE PHYSICIAN: System, Pcp Not In   ADMISSION DIAGNOSIS:   Weakness of both arms [R29.898]  DISCHARGE DIAGNOSIS:   Active Problems:   Weakness   CVA (cerebral vascular accident) ([place])   Pressure injury of skin   Malnutrition of moderate degree   SECONDARY DIAGNOSIS:   Past Medical History:  Diagnosis Date  . Adenocarcinoma ([place])   . Anxiety   . Bilateral renal masses   . CHF (congestive heart failure) ([place])   . Colon polyp   . Degenerative joint disease   . Depression   . DNR (do not resuscitate)   . Fitting and adjustment of cardiac pacemaker   . Hemiparesis affecting left side as late effect of cerebrovascular accident (CVA) ([place])   . Hyperlipemia   . Hypertension   . Parkinson disease ([place])   . Paroxysmal atrial fibrillation ([facility])   . Peripheral arterial disease ([place])   . Skin cancer     HOSPITAL COURSE:    84 year old male with past medical history significant for CVA with left-sided hemiparesis, bedbound at baseline, CHF, hypertension, Parkinson's disease, history of A. fib who is coming from [place] secondary to decreased responsiveness.  1.  Right-sided hemiparesis-relatively new.  Secondary to likely severe cervical spinal stenosis. -Old left-sided weakness from a stroke.  Patient is bedbound at this time -Neurosurgery was consulted but they did not recommend any intervention due to patient's comorbidities. -Hospice has been suggested.  Family interested in a hospice home.  -Patient does not qualify at this time.  Will likely need to go to a long-term care with hospice services.  2.  Prior history of stroke-patient on eliquis and statin  3.  GERD-Protonix  4.  Hypertension-on Toprol  5.   BPH-on Proscar, Hytrin  6.  Parkinson's disease-stable on Sinemet  7.  DVT prophylaxis-teds and SCDs    DISCHARGE CONDITIONS:   Guarded  CONSULTS OBTAINED:   Treatment Team:  Alexis Goodell, MD Ubaldo Glassing Javier Docker, MD Meade Maw, MD  DRUG ALLERGIES:   No Known Allergies DISCHARGE MEDICATIONS:   Allergies as of 06/10/2018   No Known Allergies     Medication List    STOP taking these medications   aspirin 81 MG chewable tablet   Calcium Carbonate-Vitamin D 600-400 MG-UNIT tablet   carbidopa-levodopa 25-100 MG tablet Commonly known as:  SINEMET IR   Cholecalciferol 10000 units Tabs   fluticasone 50 MCG/ACT nasal spray Commonly known as:  FLONASE   folic acid 1 MG tablet Commonly known as:  FOLVITE   furosemide 20 MG tablet Commonly known as:  LASIX   loratadine 10 MG tablet Commonly known as:  CLARITIN   miconazole 2 % cream Commonly known as:  MICOTIN   omega-3 acid ethyl esters 1 g capsule Commonly known as:  LOVAZA   psyllium 95 % Pack Commonly known as:  HYDROCIL/METAMUCIL   REFRESH TEARS 0.5 % Soln Generic drug:  carboxymethylcellulose   Salicylic Acid 6 % Sham   SENSI-CARE PROTECTIVE BARRIER EX   THERA-GESIC EX   torsemide 10 MG tablet Commonly known as:  DEMADEX   vitamin B-12 1000 MCG tablet Commonly known as:  CYANOCOBALAMIN     TAKE these medications   acetaminophen  325 MG tablet Commonly known as:  TYLENOL Take 650 mg by mouth 3 (three) times daily.   apixaban 5 MG Tabs tablet Commonly known as:  ELIQUIS Take 5 mg by mouth every 12 (twelve) hours.   atorvastatin 80 MG tablet Commonly known as:  LIPITOR Take 40 mg by mouth at bedtime.   DULoxetine 30 MG capsule Commonly known as:  CYMBALTA Take 90 mg by mouth daily.   finasteride 5 MG tablet Commonly known as:  PROSCAR Take 5 mg by mouth daily.   ibuprofen 400 MG tablet Commonly known as:  ADVIL,MOTRIN Take 400 mg by mouth as needed for mild pain or  moderate pain.   Melatonin 3 MG Tabs Take 6 mg by mouth at bedtime.   metoprolol succinate 50 MG 24 hr tablet Commonly known as:  TOPROL-XL Take 25 mg by mouth daily. Take with or immediately following a meal.   morphine CONCENTRATE 10 mg / 0.5 ml concentrated solution Take 0.5 mLs (10 mg total) by mouth every 4 (four) hours as needed for severe pain, anxiety or shortness of breath.   OLANZapine 2.5 MG tablet Commonly known as:  ZYPREXA Take 2.5 mg by mouth at bedtime.   pantoprazole 40 MG tablet Commonly known as:  PROTONIX Take 40 mg by mouth 2 (two) times daily.   senna-docusate 8.6-50 MG tablet Commonly known as:  Senokot-S Take 1 tablet by mouth 2 (two) times daily.   terazosin 5 MG capsule Commonly known as:  HYTRIN Take 5 mg by mouth at bedtime.        DISCHARGE INSTRUCTIONS:   1. PCP f/u in 1-2 weeks 2. Hospice follow up  DIET:   Dysphagia level 3 w/ MINCED Meats, gravy added to moisten; Thin liquids. General aspiration precautions; Pills WHOLE in Puree. Feeding Support at meals.   ACTIVITY:   Activity as tolerated  OXYGEN:   Home Oxygen: No.  Oxygen Delivery: room air  DISCHARGE LOCATION:   nursing home   If you experience worsening of your admission symptoms, develop shortness of breath, life threatening emergency, suicidal or homicidal thoughts you must seek medical attention immediately by calling 911 or calling your MD immediately  if symptoms less severe.  You Must read complete instructions/literature along with all the possible adverse reactions/side effects for all the Medicines you take and that have been prescribed to you. Take any new Medicines after you have completely understood and accpet all the possible adverse reactions/side effects.   Please note  You were cared for by a hospitalist during your hospital stay. If you have any questions about your discharge medications or the care you received while you were in the hospital after  you are discharged, you can call the unit and asked to speak with the hospitalist on call if the hospitalist that took care of you is not available. Once you are discharged, your primary care physician will handle any further medical issues. Please note that NO REFILLS for any discharge medications will be authorized once you are discharged, as it is imperative that you return to your primary care physician (or establish a relationship with a primary care physician if you do not have one) for your aftercare needs so that they can reassess your need for medications and monitor your lab values.    On the day of Discharge:  VITAL SIGNS:   Blood pressure (!) 145/79, pulse 79, temperature 97.7 F (36.5 C), temperature source Oral, resp. rate 19, height 5\' 8"  (1.727 m), weight  78.2 kg (172 lb 6 oz), SpO2 97 %.  PHYSICAL EXAMINATION:   GENERAL:  84 y.o.-year-old elderly  patient lying in the bed with no acute distress.  EYES: Pupils equal, round, reactive to light and accommodation. No scleral icterus. Extraocular muscles intact.  HEENT: Head atraumatic, normocephalic. Oropharynx and nasopharynx clear.  NECK:  Supple, no jugular venous distention. No thyroid enlargement, no tenderness.  LUNGS: Normal breath sounds bilaterally, no wheezing, rales,rhonchi or crepitation. No use of accessory muscles of respiration.  Decreased bibasilar breath sounds CARDIOVASCULAR: S1, S2 normal. No  rubs, or gallops.  2/6 systolic murmur is present -Pacemaker on left chest noted ABDOMEN: Soft, nontender, nondistended. Bowel sounds present. No organomegaly or mass.  EXTREMITIES: No pedal edema, cyanosis, or clubbing.  NEUROLOGIC: Staggering speech, very hard of hearing.  Able to make needs known.  Complete hemiparesis of right side.  Even left upper and lower extremities are extremely weak.  PSYCHIATRIC: The patient is alert.  SKIN: No obvious rash, lesion, or ulcer.    DATA REVIEW:   CBC Recent Labs  Lab  06/03/18 1443  WBC 7.6  HGB 9.0*  HCT 28.6*  PLT 275    Chemistries  Recent Labs  Lab 06/03/18 1443  NA 140  K 3.7  CL 101  CO2 28  GLUCOSE 119*  BUN 31*  CREATININE 1.04  CALCIUM 9.3  AST 20  ALT <5*  ALKPHOS 26  BILITOT 1.1     Microbiology Results  Results for orders placed or performed during the hospital encounter of 06/03/18  MRSA PCR Screening     Status: None   Collection Time: 06/04/18  1:30 AM  Result Value Ref Range Status   MRSA by PCR NEGATIVE NEGATIVE Final    Comment:        The GeneXpert MRSA Assay (FDA approved for NASAL specimens only), is one component of a comprehensive MRSA colonization surveillance program. It is not intended to diagnose MRSA infection nor to guide or monitor treatment for MRSA infections. Performed at [facility], [address] [place], [place] [address]     RADIOLOGY:  No results found.   Management plans discussed with the patient, family and they are in agreement.  CODE STATUS:     Code Status Orders  (From admission, onward)        Start     Ordered   06/07/18 1508  Do not attempt resuscitation (DNR)  Continuous    Question Answer Comment  In the event of cardiac or respiratory ARREST Do not call a "code blue"   In the event of cardiac or respiratory ARREST Do not perform Intubation, CPR, defibrillation or ACLS   In the event of cardiac or respiratory ARREST Use medication by any route, position, wound care, and other measures to relive pain and suffering. May use oxygen, suction and manual treatment of airway obstruction as needed for comfort.   Comments discussed with patient son no intubation; nurse may pronounce      06/07/18 1508    Code Status History    Date Active Date Inactive Code Status Order ID Comments User Context   06/07/2018 0935 06/07/2018 1508 DNR [id number]  Bettey Costa, MD Inpatient   06/03/2018 2135 06/07/2018 0935 Partial Code [id number]  Harrie Foreman, MD Inpatient     02/21/2018 1044 02/22/2018 1546 Partial Code [id number]  Dustin Flock, MD Inpatient   02/21/2018 1003 02/21/2018 1044 DNR [id number]  Dustin Flock, MD Inpatient   02/21/2018 [phone] 02/21/2018  1003 Full Code [id number]  Dustin Flock, MD ED      TOTAL TIME TAKING CARE OF THIS PATIENT: 38 minutes.    Gladstone Lighter M.D on 06/09/2018 at 1:00 PM  Between 7am to 6pm - Pager - [phone]  After 6pm go to [email or web address] - Proofreader  [facility] [place] [facility]  Office  [phone]  CC: Primary care physician; System, Pcp Not In   Note: This dictation was prepared with Dragon dictation along with smaller phrase technology. Any transcriptional errors that result from this process are unintentional.

## 2018-06-10 MED ORDER — MORPHINE SULFATE (CONCENTRATE) 10 MG /0.5 ML PO SOLN: 10.0000 mg | ORAL | 0 refills | Status: AC | PRN

## 2018-06-10 NOTE — Clinical Social Work Note (Signed)
CSW spoke with patient's son Linkyn Gobin [phone] regarding discharge disposition. Son is in agreement with patient returning to [facility] with hospice services. Son would like to use [facility]. CSW notified Santiago Glad, hospice liaison of above. CSW will continue to follow for discharge planning.   [place], [place]

## 2018-06-10 NOTE — Progress Notes (Signed)
Report called to nurse at [facility]. Unable to complete  End of report due to nurse said she needed to get her supervisor. Who said she did not have any paperwork on pt . Social worker here said she would  Fax paperwork.

## 2018-06-10 NOTE — Progress Notes (Signed)
Physical Therapy Treatment Patient Details Name: Danny Grimes MRN: [id number] DOB: Jul 06, 1928 Today's Date: 06/10/2018    History of Present Illness 84yo male pt presented to ER secondary to episode of decreased responsiveness, worsening R-sided weakness; admitted for TIA/CVA work-up.  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 MRI unable due to [facility]. Neurology recommending neuro surgery consult.    PT Comments    Chart reviewed.  Neuro consult reviewed.  Pt required max a x 2/dependant for repositioning in bed.  BLE PROM with no active assist noted during activity.  He was observed upon entering room to be wiggling his R toes but was unable to reproduce when asked.  He complained of some stiffness during activity.  Pt is appropriate for lift transfers if transfer out of bed to chair is tried.   Follow Up Recommendations  SNF     Equipment Recommendations       Recommendations for Other Services       Precautions / Restrictions Precautions Precautions: Fall Restrictions Weight Bearing Restrictions: No    Mobility  Bed Mobility               General bed mobility comments: deferred  Transfers                    Ambulation/Gait                 Stairs             Wheelchair Mobility    Modified Rankin (Stroke Patients Only)       Balance                                            Cognition Arousal/Alertness: Awake/alert Behavior During Therapy: WFL for tasks assessed/performed Overall Cognitive Status: Within Functional Limits for tasks assessed                                        Exercises Other Exercises Other Exercises: BLE PROM for ankle pumps, heel slides, SLR, ab/add and SAQ.      General Comments        Pertinent Vitals/Pain Pain Assessment: Faces Faces Pain Scale: Hurts a little bit Pain  Location: BLE's with PROM Pain Descriptors / Indicators: Grimacing Pain Intervention(s): Limited activity within patient's tolerance;Monitored during session    Home Living                      Prior Function            PT Goals (current goals can now be found in the care plan section) Progress towards PT goals: Not progressing toward goals - comment    Frequency    Min 2X/week      PT Plan Current plan remains appropriate    Co-evaluation              AM-PAC PT "6 Clicks" Daily Activity  Outcome Measure  Difficulty turning over in bed (including adjusting bedclothes, sheets and blankets)?: Unable Difficulty moving from lying on back to sitting on the side of the bed? : Unable Difficulty sitting down on and standing up from a chair with arms (e.g., wheelchair, bedside commode,  etc,.)?: Unable Help needed moving to and from a bed to chair (including a wheelchair)?: Total Help needed walking in hospital room?: Total Help needed climbing 3-5 steps with a railing? : Total 6 Click Score: 6    End of Session   Activity Tolerance: Patient limited by fatigue Patient left: in bed;with bed alarm set;with call bell/phone within reach         Time: 7253-6644 PT Time Calculation (min) (ACUTE ONLY): 9 min  Charges:  $Therapeutic Exercise: 8-22 mins                    G Codes:       Chesley Noon, PTA 06/10/18, 9:59 AM

## 2018-06-10 NOTE — Clinical Social Work Note (Signed)
Patient is medically ready for discharge back to [facility] today. CSW notified patient's son Vane Yapp [phone] that patient will discharge back with hospice services as well. Son is in agreement. CSW contacted Doug at [facility] and notified him of above. Patient will be transported by EMS. RN to call report and call for transport.   [place], [place]

## 2018-06-10 NOTE — Plan of Care (Signed)
  Problem: Education: Goal: Knowledge of General Education information will improve Outcome: Progressing   Problem: Nutrition: Goal: Adequate nutrition will be maintained Outcome: Progressing   Problem: Pain Managment: Goal: General experience of comfort will improve Outcome: Progressing   Problem: Safety: Goal: Ability to remain free from injury will improve Outcome: Progressing   

## 2018-06-10 NOTE — Progress Notes (Signed)
New referral for [facility] services at [facility] received from [place]. Patient is an 84 year old man with a known history of Parkinson's disease A. fib, CHF, and CVA  admitted to [facility] on 6/11 from [facility] for evaluation of decreased responsiveness and increased/new extremity weakness. CVA was suspected and Neurology was consulted as well as Neuro surgery.  Patient was not able to undergo an MRI due to his pacemaker. Family had wanted a transfer to the [place], no beds available. His son [name] has agreed with discharge back to [facility] with the support of Hospice services. Patient is now completely dependent for all ADLs. He is able to eat when fed, and is taking oral medications. Patient will need a hospital bed in place prior to discharge and will require EMS transport. Hospital care team updated. DME ordered. Patient information faxed to referral. Will continue to follow through final disposition. Thank you for the opportunity to be involved in the care of this patient. Flo Shanks RN, BSN, [facility] [place], hospital liaison [phone]

## 2018-06-10 NOTE — Progress Notes (Signed)
Pt left via ems at this time to return to [facility]. Alert. No resp distress / report called earlier  To ah. See note. Sl d/cd.

## 2018-06-11 DIAGNOSIS — N2889 Other specified disorders of kidney and ureter: Secondary | ICD-10-CM | POA: Diagnosis not present

## 2018-06-11 DIAGNOSIS — I5023 Acute on chronic systolic (congestive) heart failure: Secondary | ICD-10-CM | POA: Diagnosis not present

## 2018-06-11 DIAGNOSIS — I69354 Hemiplegia and hemiparesis following cerebral infarction affecting left non-dominant side: Secondary | ICD-10-CM | POA: Diagnosis not present

## 2018-06-11 DIAGNOSIS — Z950 Presence of cardiac pacemaker: Secondary | ICD-10-CM | POA: Diagnosis not present

## 2018-06-11 DIAGNOSIS — R1310 Dysphagia, unspecified: Secondary | ICD-10-CM | POA: Diagnosis not present

## 2018-06-11 DIAGNOSIS — G9511 Acute infarction of spinal cord (embolic) (nonembolic): Secondary | ICD-10-CM | POA: Diagnosis not present

## 2018-06-11 DIAGNOSIS — G20 Parkinson's disease: Secondary | ICD-10-CM | POA: Diagnosis not present

## 2018-06-11 DIAGNOSIS — N400 Enlarged prostate without lower urinary tract symptoms: Secondary | ICD-10-CM | POA: Diagnosis not present

## 2018-06-11 DIAGNOSIS — I6501 Occlusion and stenosis of right vertebral artery: Secondary | ICD-10-CM | POA: Diagnosis not present

## 2018-06-11 DIAGNOSIS — Z85828 Personal history of other malignant neoplasm of skin: Secondary | ICD-10-CM | POA: Diagnosis not present

## 2018-06-11 DIAGNOSIS — M1990 Unspecified osteoarthritis, unspecified site: Secondary | ICD-10-CM | POA: Diagnosis not present

## 2018-06-11 DIAGNOSIS — I69320 Aphasia following cerebral infarction: Secondary | ICD-10-CM | POA: Diagnosis not present

## 2018-06-11 DIAGNOSIS — F329 Major depressive disorder, single episode, unspecified: Secondary | ICD-10-CM | POA: Diagnosis not present

## 2018-06-11 DIAGNOSIS — Z87891 Personal history of nicotine dependence: Secondary | ICD-10-CM | POA: Diagnosis not present

## 2018-06-11 DIAGNOSIS — I110 Hypertensive heart disease with heart failure: Secondary | ICD-10-CM | POA: Diagnosis not present

## 2018-06-11 DIAGNOSIS — G8250 Quadriplegia, unspecified: Secondary | ICD-10-CM | POA: Diagnosis not present

## 2018-06-11 DIAGNOSIS — R634 Abnormal weight loss: Secondary | ICD-10-CM | POA: Diagnosis not present

## 2018-06-11 DIAGNOSIS — F419 Anxiety disorder, unspecified: Secondary | ICD-10-CM | POA: Diagnosis not present

## 2018-06-11 DIAGNOSIS — Z741 Need for assistance with personal care: Secondary | ICD-10-CM | POA: Diagnosis not present

## 2018-06-11 DIAGNOSIS — I69391 Dysphagia following cerebral infarction: Secondary | ICD-10-CM | POA: Diagnosis not present

## 2018-06-11 DIAGNOSIS — K219 Gastro-esophageal reflux disease without esophagitis: Secondary | ICD-10-CM | POA: Diagnosis not present

## 2018-06-11 DIAGNOSIS — I480 Paroxysmal atrial fibrillation: Secondary | ICD-10-CM | POA: Diagnosis not present

## 2018-06-11 DIAGNOSIS — I69351 Hemiplegia and hemiparesis following cerebral infarction affecting right dominant side: Secondary | ICD-10-CM | POA: Diagnosis not present

## 2018-06-11 DIAGNOSIS — I739 Peripheral vascular disease, unspecified: Secondary | ICD-10-CM | POA: Diagnosis not present

## 2018-06-11 DIAGNOSIS — E785 Hyperlipidemia, unspecified: Secondary | ICD-10-CM | POA: Diagnosis not present

## 2018-06-12 DIAGNOSIS — I69351 Hemiplegia and hemiparesis following cerebral infarction affecting right dominant side: Secondary | ICD-10-CM | POA: Diagnosis not present

## 2018-06-12 DIAGNOSIS — G8250 Quadriplegia, unspecified: Secondary | ICD-10-CM | POA: Diagnosis not present

## 2018-06-12 DIAGNOSIS — R1310 Dysphagia, unspecified: Secondary | ICD-10-CM | POA: Diagnosis not present

## 2018-06-12 DIAGNOSIS — I69320 Aphasia following cerebral infarction: Secondary | ICD-10-CM | POA: Diagnosis not present

## 2018-06-12 DIAGNOSIS — I69354 Hemiplegia and hemiparesis following cerebral infarction affecting left non-dominant side: Secondary | ICD-10-CM | POA: Diagnosis not present

## 2018-06-12 DIAGNOSIS — I69391 Dysphagia following cerebral infarction: Secondary | ICD-10-CM | POA: Diagnosis not present

## 2018-06-13 DIAGNOSIS — I69320 Aphasia following cerebral infarction: Secondary | ICD-10-CM | POA: Diagnosis not present

## 2018-06-13 DIAGNOSIS — I69354 Hemiplegia and hemiparesis following cerebral infarction affecting left non-dominant side: Secondary | ICD-10-CM | POA: Diagnosis not present

## 2018-06-13 DIAGNOSIS — I69351 Hemiplegia and hemiparesis following cerebral infarction affecting right dominant side: Secondary | ICD-10-CM | POA: Diagnosis not present

## 2018-06-13 DIAGNOSIS — I69391 Dysphagia following cerebral infarction: Secondary | ICD-10-CM | POA: Diagnosis not present

## 2018-06-13 DIAGNOSIS — R1310 Dysphagia, unspecified: Secondary | ICD-10-CM | POA: Diagnosis not present

## 2018-06-13 DIAGNOSIS — G8250 Quadriplegia, unspecified: Secondary | ICD-10-CM | POA: Diagnosis not present

## 2018-06-16 DIAGNOSIS — I69351 Hemiplegia and hemiparesis following cerebral infarction affecting right dominant side: Secondary | ICD-10-CM | POA: Diagnosis not present

## 2018-06-16 DIAGNOSIS — I69320 Aphasia following cerebral infarction: Secondary | ICD-10-CM | POA: Diagnosis not present

## 2018-06-16 DIAGNOSIS — R1310 Dysphagia, unspecified: Secondary | ICD-10-CM | POA: Diagnosis not present

## 2018-06-16 DIAGNOSIS — I69354 Hemiplegia and hemiparesis following cerebral infarction affecting left non-dominant side: Secondary | ICD-10-CM | POA: Diagnosis not present

## 2018-06-16 DIAGNOSIS — G8250 Quadriplegia, unspecified: Secondary | ICD-10-CM | POA: Diagnosis not present

## 2018-06-16 DIAGNOSIS — I69391 Dysphagia following cerebral infarction: Secondary | ICD-10-CM | POA: Diagnosis not present

## 2018-06-17 DIAGNOSIS — I69391 Dysphagia following cerebral infarction: Secondary | ICD-10-CM | POA: Diagnosis not present

## 2018-06-17 DIAGNOSIS — R1310 Dysphagia, unspecified: Secondary | ICD-10-CM | POA: Diagnosis not present

## 2018-06-17 DIAGNOSIS — I69351 Hemiplegia and hemiparesis following cerebral infarction affecting right dominant side: Secondary | ICD-10-CM | POA: Diagnosis not present

## 2018-06-17 DIAGNOSIS — I69354 Hemiplegia and hemiparesis following cerebral infarction affecting left non-dominant side: Secondary | ICD-10-CM | POA: Diagnosis not present

## 2018-06-17 DIAGNOSIS — I69320 Aphasia following cerebral infarction: Secondary | ICD-10-CM | POA: Diagnosis not present

## 2018-06-17 DIAGNOSIS — G8250 Quadriplegia, unspecified: Secondary | ICD-10-CM | POA: Diagnosis not present

## 2018-06-18 DIAGNOSIS — I69320 Aphasia following cerebral infarction: Secondary | ICD-10-CM | POA: Diagnosis not present

## 2018-06-18 DIAGNOSIS — R1310 Dysphagia, unspecified: Secondary | ICD-10-CM | POA: Diagnosis not present

## 2018-06-18 DIAGNOSIS — G8250 Quadriplegia, unspecified: Secondary | ICD-10-CM | POA: Diagnosis not present

## 2018-06-18 DIAGNOSIS — I69391 Dysphagia following cerebral infarction: Secondary | ICD-10-CM | POA: Diagnosis not present

## 2018-06-18 DIAGNOSIS — I69354 Hemiplegia and hemiparesis following cerebral infarction affecting left non-dominant side: Secondary | ICD-10-CM | POA: Diagnosis not present

## 2018-06-18 DIAGNOSIS — I69351 Hemiplegia and hemiparesis following cerebral infarction affecting right dominant side: Secondary | ICD-10-CM | POA: Diagnosis not present

## 2018-06-19 DIAGNOSIS — I69391 Dysphagia following cerebral infarction: Secondary | ICD-10-CM | POA: Diagnosis not present

## 2018-06-19 DIAGNOSIS — R1310 Dysphagia, unspecified: Secondary | ICD-10-CM | POA: Diagnosis not present

## 2018-06-19 DIAGNOSIS — I69351 Hemiplegia and hemiparesis following cerebral infarction affecting right dominant side: Secondary | ICD-10-CM | POA: Diagnosis not present

## 2018-06-19 DIAGNOSIS — I69354 Hemiplegia and hemiparesis following cerebral infarction affecting left non-dominant side: Secondary | ICD-10-CM | POA: Diagnosis not present

## 2018-06-19 DIAGNOSIS — I69320 Aphasia following cerebral infarction: Secondary | ICD-10-CM | POA: Diagnosis not present

## 2018-06-19 DIAGNOSIS — G8250 Quadriplegia, unspecified: Secondary | ICD-10-CM | POA: Diagnosis not present

## 2018-06-20 DIAGNOSIS — I69320 Aphasia following cerebral infarction: Secondary | ICD-10-CM | POA: Diagnosis not present

## 2018-06-20 DIAGNOSIS — I69351 Hemiplegia and hemiparesis following cerebral infarction affecting right dominant side: Secondary | ICD-10-CM | POA: Diagnosis not present

## 2018-06-20 DIAGNOSIS — R1310 Dysphagia, unspecified: Secondary | ICD-10-CM | POA: Diagnosis not present

## 2018-06-20 DIAGNOSIS — I69391 Dysphagia following cerebral infarction: Secondary | ICD-10-CM | POA: Diagnosis not present

## 2018-06-20 DIAGNOSIS — I69354 Hemiplegia and hemiparesis following cerebral infarction affecting left non-dominant side: Secondary | ICD-10-CM | POA: Diagnosis not present

## 2018-06-20 DIAGNOSIS — G8250 Quadriplegia, unspecified: Secondary | ICD-10-CM | POA: Diagnosis not present

## 2018-06-23 DIAGNOSIS — G20 Parkinson's disease: Secondary | ICD-10-CM | POA: Diagnosis not present

## 2018-06-23 DIAGNOSIS — E785 Hyperlipidemia, unspecified: Secondary | ICD-10-CM | POA: Diagnosis not present

## 2018-06-23 DIAGNOSIS — I6501 Occlusion and stenosis of right vertebral artery: Secondary | ICD-10-CM | POA: Diagnosis not present

## 2018-06-23 DIAGNOSIS — N2889 Other specified disorders of kidney and ureter: Secondary | ICD-10-CM | POA: Diagnosis not present

## 2018-06-23 DIAGNOSIS — G8250 Quadriplegia, unspecified: Secondary | ICD-10-CM | POA: Diagnosis not present

## 2018-06-23 DIAGNOSIS — N400 Enlarged prostate without lower urinary tract symptoms: Secondary | ICD-10-CM | POA: Diagnosis not present

## 2018-06-23 DIAGNOSIS — I5023 Acute on chronic systolic (congestive) heart failure: Secondary | ICD-10-CM | POA: Diagnosis not present

## 2018-06-23 DIAGNOSIS — I69351 Hemiplegia and hemiparesis following cerebral infarction affecting right dominant side: Secondary | ICD-10-CM | POA: Diagnosis not present

## 2018-06-23 DIAGNOSIS — Z741 Need for assistance with personal care: Secondary | ICD-10-CM | POA: Diagnosis not present

## 2018-06-23 DIAGNOSIS — F329 Major depressive disorder, single episode, unspecified: Secondary | ICD-10-CM | POA: Diagnosis not present

## 2018-06-23 DIAGNOSIS — K219 Gastro-esophageal reflux disease without esophagitis: Secondary | ICD-10-CM | POA: Diagnosis not present

## 2018-06-23 DIAGNOSIS — G9511 Acute infarction of spinal cord (embolic) (nonembolic): Secondary | ICD-10-CM | POA: Diagnosis not present

## 2018-06-23 DIAGNOSIS — I480 Paroxysmal atrial fibrillation: Secondary | ICD-10-CM | POA: Diagnosis not present

## 2018-06-23 DIAGNOSIS — Z85828 Personal history of other malignant neoplasm of skin: Secondary | ICD-10-CM | POA: Diagnosis not present

## 2018-06-23 DIAGNOSIS — I69320 Aphasia following cerebral infarction: Secondary | ICD-10-CM | POA: Diagnosis not present

## 2018-06-23 DIAGNOSIS — I69391 Dysphagia following cerebral infarction: Secondary | ICD-10-CM | POA: Diagnosis not present

## 2018-06-23 DIAGNOSIS — I739 Peripheral vascular disease, unspecified: Secondary | ICD-10-CM | POA: Diagnosis not present

## 2018-06-23 DIAGNOSIS — F419 Anxiety disorder, unspecified: Secondary | ICD-10-CM | POA: Diagnosis not present

## 2018-06-23 DIAGNOSIS — Z950 Presence of cardiac pacemaker: Secondary | ICD-10-CM | POA: Diagnosis not present

## 2018-06-23 DIAGNOSIS — M1990 Unspecified osteoarthritis, unspecified site: Secondary | ICD-10-CM | POA: Diagnosis not present

## 2018-06-23 DIAGNOSIS — R1310 Dysphagia, unspecified: Secondary | ICD-10-CM | POA: Diagnosis not present

## 2018-06-23 DIAGNOSIS — Z87891 Personal history of nicotine dependence: Secondary | ICD-10-CM | POA: Diagnosis not present

## 2018-06-23 DIAGNOSIS — I110 Hypertensive heart disease with heart failure: Secondary | ICD-10-CM | POA: Diagnosis not present

## 2018-06-23 DIAGNOSIS — I69354 Hemiplegia and hemiparesis following cerebral infarction affecting left non-dominant side: Secondary | ICD-10-CM | POA: Diagnosis not present

## 2018-06-23 DIAGNOSIS — R634 Abnormal weight loss: Secondary | ICD-10-CM | POA: Diagnosis not present

## 2018-06-24 DIAGNOSIS — R1310 Dysphagia, unspecified: Secondary | ICD-10-CM | POA: Diagnosis not present

## 2018-06-24 DIAGNOSIS — I69320 Aphasia following cerebral infarction: Secondary | ICD-10-CM | POA: Diagnosis not present

## 2018-06-24 DIAGNOSIS — G8250 Quadriplegia, unspecified: Secondary | ICD-10-CM | POA: Diagnosis not present

## 2018-06-24 DIAGNOSIS — I69351 Hemiplegia and hemiparesis following cerebral infarction affecting right dominant side: Secondary | ICD-10-CM | POA: Diagnosis not present

## 2018-06-24 DIAGNOSIS — I69354 Hemiplegia and hemiparesis following cerebral infarction affecting left non-dominant side: Secondary | ICD-10-CM | POA: Diagnosis not present

## 2018-06-24 DIAGNOSIS — I69391 Dysphagia following cerebral infarction: Secondary | ICD-10-CM | POA: Diagnosis not present

## 2018-06-25 DIAGNOSIS — R1310 Dysphagia, unspecified: Secondary | ICD-10-CM | POA: Diagnosis not present

## 2018-06-25 DIAGNOSIS — I69351 Hemiplegia and hemiparesis following cerebral infarction affecting right dominant side: Secondary | ICD-10-CM | POA: Diagnosis not present

## 2018-06-25 DIAGNOSIS — I69354 Hemiplegia and hemiparesis following cerebral infarction affecting left non-dominant side: Secondary | ICD-10-CM | POA: Diagnosis not present

## 2018-06-25 DIAGNOSIS — I69391 Dysphagia following cerebral infarction: Secondary | ICD-10-CM | POA: Diagnosis not present

## 2018-06-25 DIAGNOSIS — I69320 Aphasia following cerebral infarction: Secondary | ICD-10-CM | POA: Diagnosis not present

## 2018-06-25 DIAGNOSIS — G8250 Quadriplegia, unspecified: Secondary | ICD-10-CM | POA: Diagnosis not present

## 2018-06-26 DIAGNOSIS — I69320 Aphasia following cerebral infarction: Secondary | ICD-10-CM | POA: Diagnosis not present

## 2018-06-26 DIAGNOSIS — I69351 Hemiplegia and hemiparesis following cerebral infarction affecting right dominant side: Secondary | ICD-10-CM | POA: Diagnosis not present

## 2018-06-26 DIAGNOSIS — I69391 Dysphagia following cerebral infarction: Secondary | ICD-10-CM | POA: Diagnosis not present

## 2018-06-26 DIAGNOSIS — R1310 Dysphagia, unspecified: Secondary | ICD-10-CM | POA: Diagnosis not present

## 2018-06-26 DIAGNOSIS — I69354 Hemiplegia and hemiparesis following cerebral infarction affecting left non-dominant side: Secondary | ICD-10-CM | POA: Diagnosis not present

## 2018-06-26 DIAGNOSIS — G8250 Quadriplegia, unspecified: Secondary | ICD-10-CM | POA: Diagnosis not present

## 2018-06-27 DIAGNOSIS — I69351 Hemiplegia and hemiparesis following cerebral infarction affecting right dominant side: Secondary | ICD-10-CM | POA: Diagnosis not present

## 2018-06-27 DIAGNOSIS — I69320 Aphasia following cerebral infarction: Secondary | ICD-10-CM | POA: Diagnosis not present

## 2018-06-27 DIAGNOSIS — I69391 Dysphagia following cerebral infarction: Secondary | ICD-10-CM | POA: Diagnosis not present

## 2018-06-27 DIAGNOSIS — R1310 Dysphagia, unspecified: Secondary | ICD-10-CM | POA: Diagnosis not present

## 2018-06-27 DIAGNOSIS — I69354 Hemiplegia and hemiparesis following cerebral infarction affecting left non-dominant side: Secondary | ICD-10-CM | POA: Diagnosis not present

## 2018-06-27 DIAGNOSIS — G8250 Quadriplegia, unspecified: Secondary | ICD-10-CM | POA: Diagnosis not present

## 2018-06-30 DIAGNOSIS — I69351 Hemiplegia and hemiparesis following cerebral infarction affecting right dominant side: Secondary | ICD-10-CM | POA: Diagnosis not present

## 2018-06-30 DIAGNOSIS — I69320 Aphasia following cerebral infarction: Secondary | ICD-10-CM | POA: Diagnosis not present

## 2018-06-30 DIAGNOSIS — R1310 Dysphagia, unspecified: Secondary | ICD-10-CM | POA: Diagnosis not present

## 2018-06-30 DIAGNOSIS — I69391 Dysphagia following cerebral infarction: Secondary | ICD-10-CM | POA: Diagnosis not present

## 2018-06-30 DIAGNOSIS — I69354 Hemiplegia and hemiparesis following cerebral infarction affecting left non-dominant side: Secondary | ICD-10-CM | POA: Diagnosis not present

## 2018-06-30 DIAGNOSIS — G8250 Quadriplegia, unspecified: Secondary | ICD-10-CM | POA: Diagnosis not present

## 2018-07-01 DIAGNOSIS — I69354 Hemiplegia and hemiparesis following cerebral infarction affecting left non-dominant side: Secondary | ICD-10-CM | POA: Diagnosis not present

## 2018-07-01 DIAGNOSIS — R1310 Dysphagia, unspecified: Secondary | ICD-10-CM | POA: Diagnosis not present

## 2018-07-01 DIAGNOSIS — G8250 Quadriplegia, unspecified: Secondary | ICD-10-CM | POA: Diagnosis not present

## 2018-07-01 DIAGNOSIS — I69391 Dysphagia following cerebral infarction: Secondary | ICD-10-CM | POA: Diagnosis not present

## 2018-07-01 DIAGNOSIS — I69351 Hemiplegia and hemiparesis following cerebral infarction affecting right dominant side: Secondary | ICD-10-CM | POA: Diagnosis not present

## 2018-07-01 DIAGNOSIS — I69320 Aphasia following cerebral infarction: Secondary | ICD-10-CM | POA: Diagnosis not present

## 2018-07-02 DIAGNOSIS — G8250 Quadriplegia, unspecified: Secondary | ICD-10-CM | POA: Diagnosis not present

## 2018-07-02 DIAGNOSIS — R1310 Dysphagia, unspecified: Secondary | ICD-10-CM | POA: Diagnosis not present

## 2018-07-02 DIAGNOSIS — I69320 Aphasia following cerebral infarction: Secondary | ICD-10-CM | POA: Diagnosis not present

## 2018-07-02 DIAGNOSIS — I69354 Hemiplegia and hemiparesis following cerebral infarction affecting left non-dominant side: Secondary | ICD-10-CM | POA: Diagnosis not present

## 2018-07-02 DIAGNOSIS — I69391 Dysphagia following cerebral infarction: Secondary | ICD-10-CM | POA: Diagnosis not present

## 2018-07-02 DIAGNOSIS — I69351 Hemiplegia and hemiparesis following cerebral infarction affecting right dominant side: Secondary | ICD-10-CM | POA: Diagnosis not present

## 2018-07-03 DIAGNOSIS — G8250 Quadriplegia, unspecified: Secondary | ICD-10-CM | POA: Diagnosis not present

## 2018-07-03 DIAGNOSIS — R1310 Dysphagia, unspecified: Secondary | ICD-10-CM | POA: Diagnosis not present

## 2018-07-03 DIAGNOSIS — I69354 Hemiplegia and hemiparesis following cerebral infarction affecting left non-dominant side: Secondary | ICD-10-CM | POA: Diagnosis not present

## 2018-07-03 DIAGNOSIS — I69320 Aphasia following cerebral infarction: Secondary | ICD-10-CM | POA: Diagnosis not present

## 2018-07-03 DIAGNOSIS — I69351 Hemiplegia and hemiparesis following cerebral infarction affecting right dominant side: Secondary | ICD-10-CM | POA: Diagnosis not present

## 2018-07-03 DIAGNOSIS — I69391 Dysphagia following cerebral infarction: Secondary | ICD-10-CM | POA: Diagnosis not present

## 2018-07-04 DIAGNOSIS — R1310 Dysphagia, unspecified: Secondary | ICD-10-CM | POA: Diagnosis not present

## 2018-07-04 DIAGNOSIS — I69391 Dysphagia following cerebral infarction: Secondary | ICD-10-CM | POA: Diagnosis not present

## 2018-07-04 DIAGNOSIS — I69351 Hemiplegia and hemiparesis following cerebral infarction affecting right dominant side: Secondary | ICD-10-CM | POA: Diagnosis not present

## 2018-07-04 DIAGNOSIS — I69354 Hemiplegia and hemiparesis following cerebral infarction affecting left non-dominant side: Secondary | ICD-10-CM | POA: Diagnosis not present

## 2018-07-04 DIAGNOSIS — I69320 Aphasia following cerebral infarction: Secondary | ICD-10-CM | POA: Diagnosis not present

## 2018-07-04 DIAGNOSIS — G8250 Quadriplegia, unspecified: Secondary | ICD-10-CM | POA: Diagnosis not present

## 2018-07-05 DIAGNOSIS — I69351 Hemiplegia and hemiparesis following cerebral infarction affecting right dominant side: Secondary | ICD-10-CM | POA: Diagnosis not present

## 2018-07-05 DIAGNOSIS — G8250 Quadriplegia, unspecified: Secondary | ICD-10-CM | POA: Diagnosis not present

## 2018-07-05 DIAGNOSIS — I69320 Aphasia following cerebral infarction: Secondary | ICD-10-CM | POA: Diagnosis not present

## 2018-07-05 DIAGNOSIS — I69354 Hemiplegia and hemiparesis following cerebral infarction affecting left non-dominant side: Secondary | ICD-10-CM | POA: Diagnosis not present

## 2018-07-05 DIAGNOSIS — I69391 Dysphagia following cerebral infarction: Secondary | ICD-10-CM | POA: Diagnosis not present

## 2018-07-05 DIAGNOSIS — R1310 Dysphagia, unspecified: Secondary | ICD-10-CM | POA: Diagnosis not present

## 2018-07-06 DIAGNOSIS — I69351 Hemiplegia and hemiparesis following cerebral infarction affecting right dominant side: Secondary | ICD-10-CM | POA: Diagnosis not present

## 2018-07-06 DIAGNOSIS — R1310 Dysphagia, unspecified: Secondary | ICD-10-CM | POA: Diagnosis not present

## 2018-07-06 DIAGNOSIS — I69320 Aphasia following cerebral infarction: Secondary | ICD-10-CM | POA: Diagnosis not present

## 2018-07-06 DIAGNOSIS — I69354 Hemiplegia and hemiparesis following cerebral infarction affecting left non-dominant side: Secondary | ICD-10-CM | POA: Diagnosis not present

## 2018-07-06 DIAGNOSIS — G8250 Quadriplegia, unspecified: Secondary | ICD-10-CM | POA: Diagnosis not present

## 2018-07-06 DIAGNOSIS — I69391 Dysphagia following cerebral infarction: Secondary | ICD-10-CM | POA: Diagnosis not present

## 2018-07-24 DEATH — deceased

## 2018-12-19 IMAGING — CT CT ABD-PELV W/ CM
2 of 5 series · 14 of 46 positions shown, 16 images · IV contrast (APPLIED)
Comparison: None.

CLINICAL DATA: Chronic generalized abdominal pain. Diarrhea and
constipation. Initial encounter.

EXAM:
CT ABDOMEN AND PELVIS WITH CONTRAST
TECHNIQUE: Multidetector CT imaging of the abdomen and pelvis was performed
using the standard protocol following bolus administration of
intravenous contrast.
CONTRAST:  100mL [id number] IOPAMIDOL ([id number]) INJECTION 61%

[Series 2: routine abd/pel with · axial · 0.82mm/px · z∈[-1272,-822]mm · 11 of 102 slices shown, 13 images]
[im 6/102  soft-tissue]
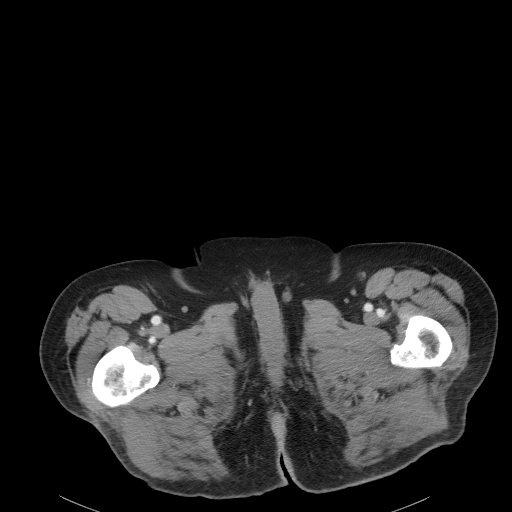
[im 6/102  bone]
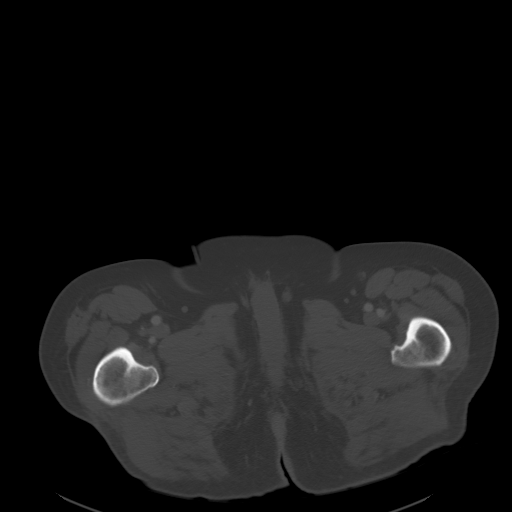
[im 16/102  soft-tissue]
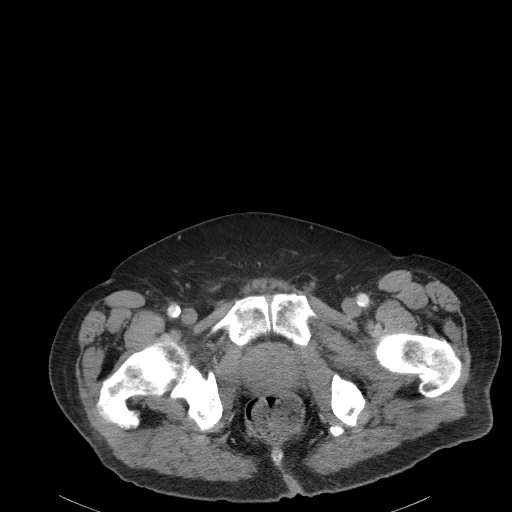
[im 26/102  soft-tissue]
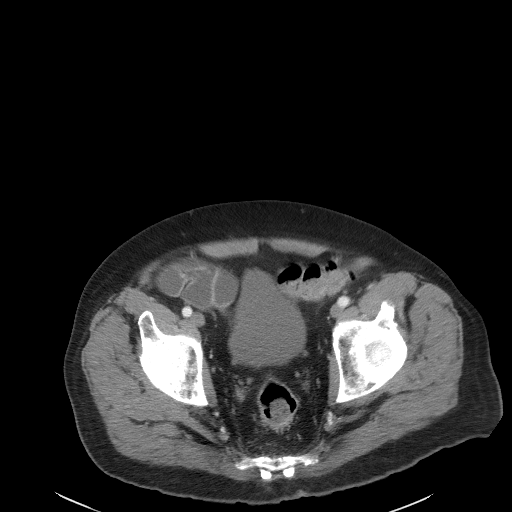
[im 36/102  soft-tissue]
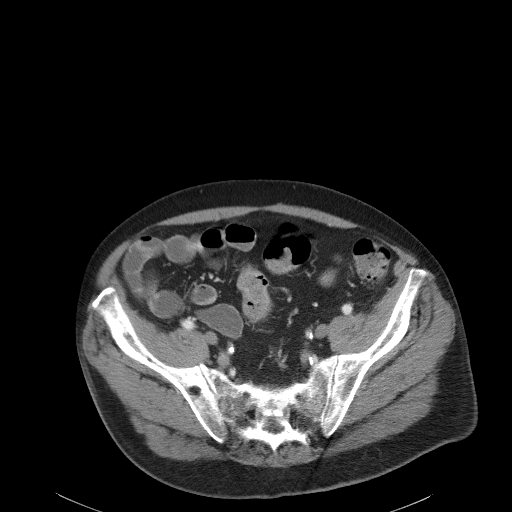
[im 41/102  soft-tissue]
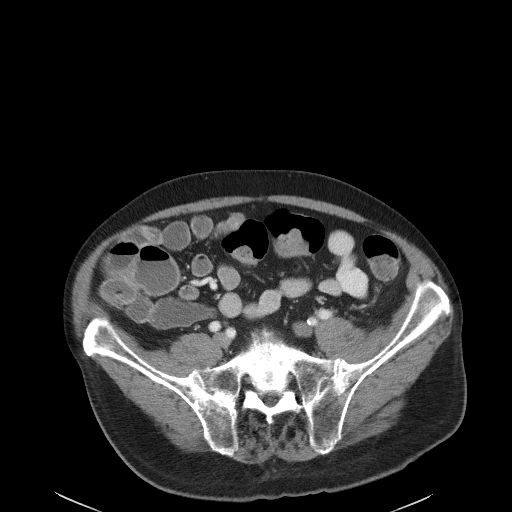
[im 51/102  soft-tissue]
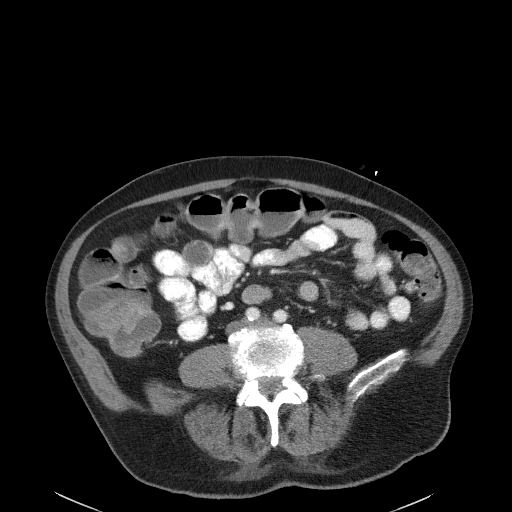
[im 61/102  soft-tissue]
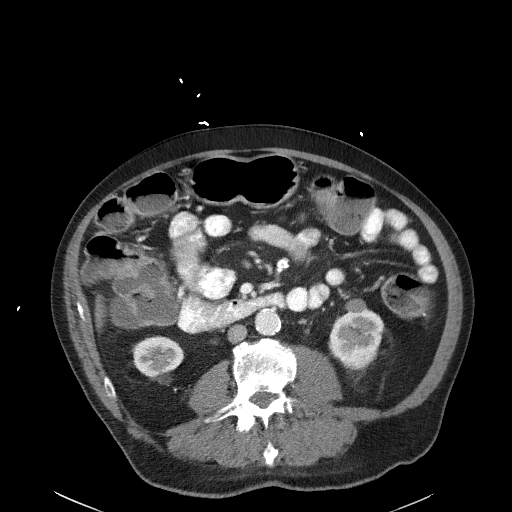
[im 66/102  soft-tissue]
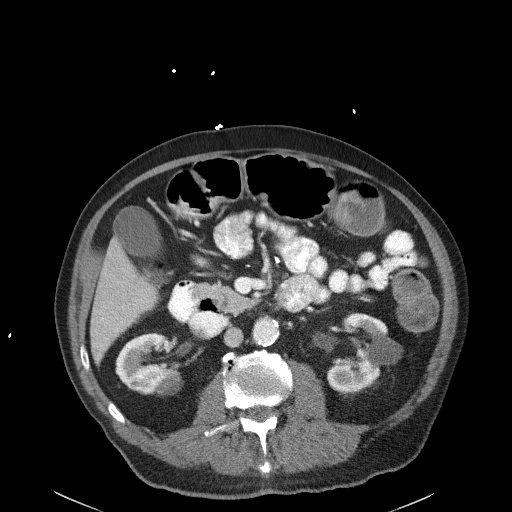
[im 76/102  soft-tissue]
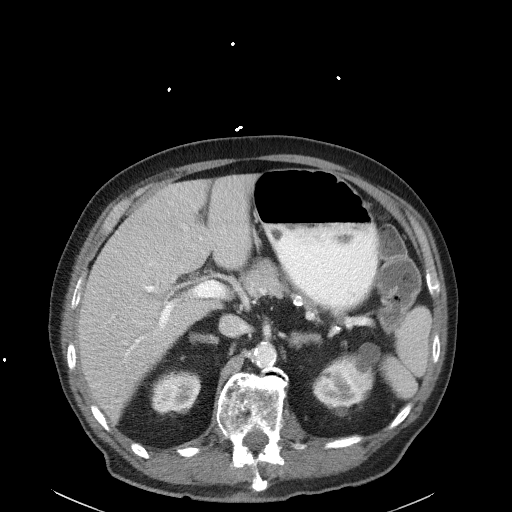
[im 76/102  bone]
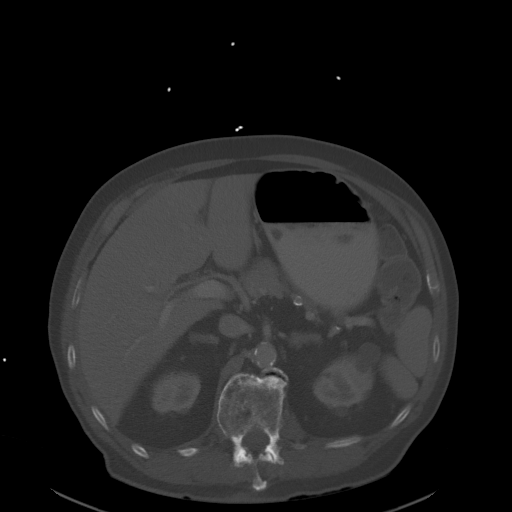
[im 86/102  soft-tissue]
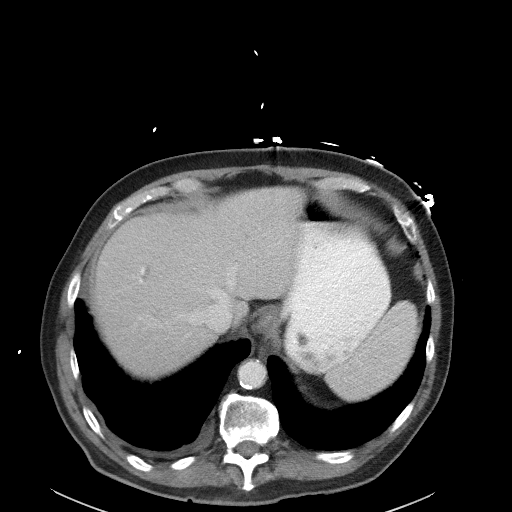
[im 96/102  soft-tissue]
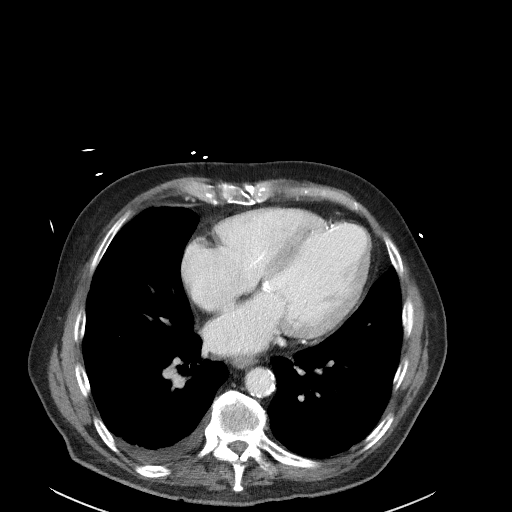

[Series 5: coronal st · coronal · 0.69mm/px · 3 of 103 slices shown]
[im 35/103  soft-tissue]
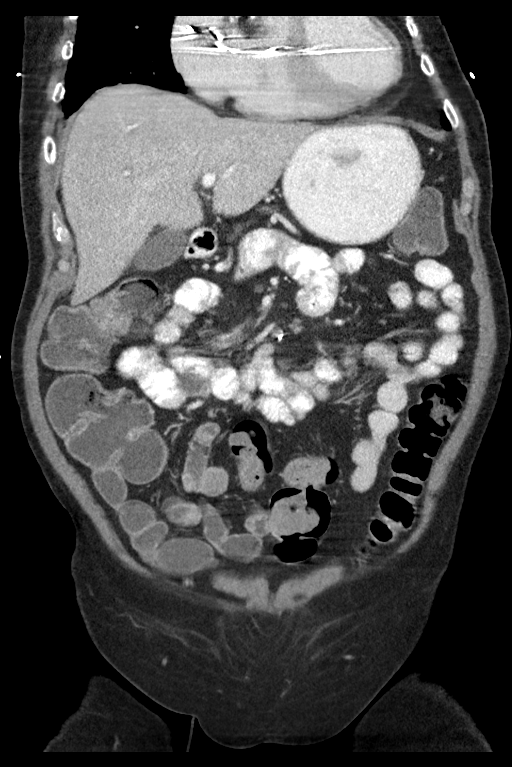
[im 46/103  soft-tissue]
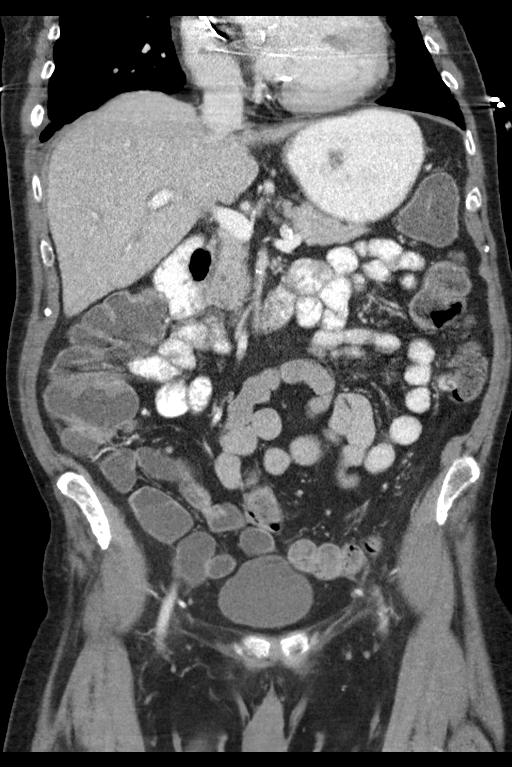
[im 57/103  soft-tissue]
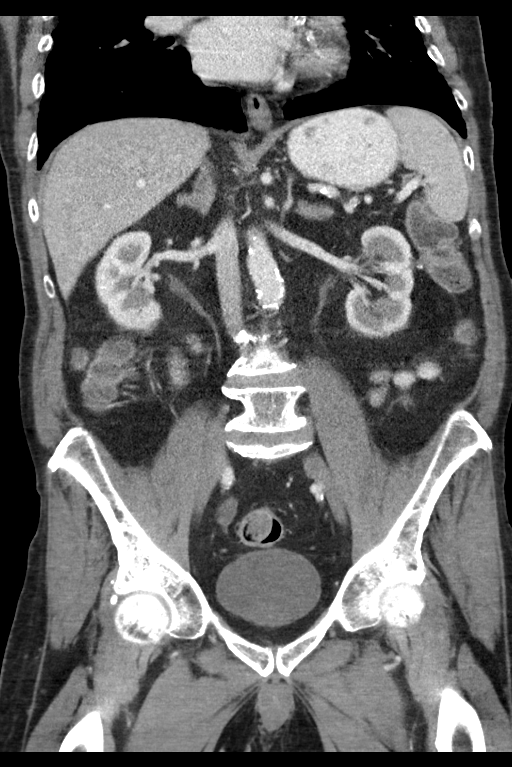

[14 of 46 positions shown; findings below may reference images not displayed]

FINDINGS: Lower chest: A trace right pleural effusion is noted. Minimal right
basilar atelectasis is noted. Diffuse coronary artery calcifications
are seen. Pacemaker leads are partially imaged. Calcification is
noted at the aortic and mitral valves. The heart is mildly enlarged.

Hepatobiliary: There is mild prominence of the intrahepatic biliary
ducts, of uncertain significance. The liver is otherwise
unremarkable. The gallbladder is unremarkable in appearance. The
common bile duct remains normal in caliber.

Pancreas: The pancreas is within normal limits.

Spleen: The spleen is unremarkable in appearance.

Adrenals/Urinary Tract: The adrenal glands are unremarkable in
appearance.

Numerous bilateral renal cysts are seen, particularly on the left.
Postoperative change and scarring is noted at the lower pole of the
right kidney. Nonspecific perinephric stranding is noted
bilaterally. There is no evidence of hydronephrosis. No renal or
ureteral stones are identified.

Stomach/Bowel: The stomach is unremarkable in appearance. The small
bowel is within normal limits. The appendix is not visualized; there
is no evidence for appendicitis. The colon is largely filled with
fluid. A small amount of stool is noted in the sigmoid colon. There
is mild sigmoid colon wall thickening, raising question for a mild
infectious or inflammatory process.

Vascular/Lymphatic: Scattered calcification is seen along the
abdominal aorta and its branches. There is mild ectasia of the
infrarenal abdominal aorta. The inferior vena cava is grossly
unremarkable. No retroperitoneal lymphadenopathy is seen. No pelvic
sidewall lymphadenopathy is identified.

Reproductive: The bladder is mildly distended. There is impression
on the base of the bladder by the prostate. The prostate measures
5.6 cm in diameter, with mild heterogeneity.

Other: No additional soft tissue abnormalities are seen.

Musculoskeletal: No acute osseous abnormalities are identified.
There is chronic loss of height at vertebral body L1, with partial
osseous fusion at T12-L1. Vacuum phenomenon is noted at the lower
lumbar spine. The visualized musculature is unremarkable in
appearance.
IMPRESSION: 1. Mild thickening of the wall of the sigmoid colon, raising
question for a mild infectious or inflammatory process. Small amount
of stool within the sigmoid colon. The colon is otherwise largely
filled with fluid.
2. Trace right pleural effusion, with minimal right basilar
atelectasis.
3. Numerous bilateral renal cysts, particularly on the left.
Postoperative change and scarring at the lower pole of the right
kidney.
4. Diffuse coronary artery calcifications seen. Calcification at the
aortic and mitral valves. Mild cardiomegaly.
5. Scattered aortic atherosclerosis. Mild ectasia of the infrarenal
abdominal aorta.
6. Enlarged prostate, with mild heterogeneity. Impression on the
base of the bladder by the prostate. Would correlate with PSA.
7. Chronic loss of height at vertebral body L1, with partial osseous
fusion at T12-L1.
8.
